# Patient Record
Sex: Female | Born: 1960 | Race: White | Hispanic: No | Marital: Single | Smoking: Never smoker
Health system: Southern US, Community
[De-identification: ages and names within clinical notes are randomized; demographics above are authoritative.]

## PROBLEM LIST (undated history)

## (undated) DIAGNOSIS — K760 Fatty (change of) liver, not elsewhere classified: Secondary | ICD-10-CM

## (undated) DIAGNOSIS — M329 Systemic lupus erythematosus, unspecified: Secondary | ICD-10-CM

## (undated) DIAGNOSIS — K219 Gastro-esophageal reflux disease without esophagitis: Secondary | ICD-10-CM

## (undated) DIAGNOSIS — I10 Essential (primary) hypertension: Secondary | ICD-10-CM

## (undated) DIAGNOSIS — D509 Iron deficiency anemia, unspecified: Secondary | ICD-10-CM

## (undated) DIAGNOSIS — K759 Inflammatory liver disease, unspecified: Secondary | ICD-10-CM

## (undated) DIAGNOSIS — M1990 Unspecified osteoarthritis, unspecified site: Secondary | ICD-10-CM

## (undated) DIAGNOSIS — M5020 Other cervical disc displacement, unspecified cervical region: Secondary | ICD-10-CM

## (undated) DIAGNOSIS — F329 Major depressive disorder, single episode, unspecified: Secondary | ICD-10-CM

## (undated) DIAGNOSIS — E7800 Pure hypercholesterolemia, unspecified: Secondary | ICD-10-CM

## (undated) DIAGNOSIS — A6000 Herpesviral infection of urogenital system, unspecified: Secondary | ICD-10-CM

## (undated) DIAGNOSIS — F32A Depression, unspecified: Secondary | ICD-10-CM

## (undated) DIAGNOSIS — M797 Fibromyalgia: Secondary | ICD-10-CM

## (undated) DIAGNOSIS — M722 Plantar fascial fibromatosis: Secondary | ICD-10-CM

## (undated) HISTORY — DX: Fibromyalgia: M79.7

## (undated) HISTORY — DX: Systemic lupus erythematosus, unspecified: M32.9

## (undated) HISTORY — DX: Iron deficiency anemia, unspecified: D50.9

## (undated) HISTORY — DX: Pure hypercholesterolemia, unspecified: E78.00

## (undated) HISTORY — DX: Other cervical disc displacement, unspecified cervical region: M50.20

## (undated) HISTORY — DX: Fatty (change of) liver, not elsewhere classified: K76.0

## (undated) HISTORY — DX: Depression, unspecified: F32.A

## (undated) HISTORY — DX: Inflammatory liver disease, unspecified: K75.9

## (undated) HISTORY — DX: Herpesviral infection of urogenital system, unspecified: A60.00

## (undated) HISTORY — DX: Major depressive disorder, single episode, unspecified: F32.9

## (undated) HISTORY — DX: Plantar fascial fibromatosis: M72.2

## (undated) HISTORY — PX: CHOLECYSTECTOMY: SHX55

## (undated) HISTORY — DX: Unspecified osteoarthritis, unspecified site: M19.90

## (undated) HISTORY — DX: Gastro-esophageal reflux disease without esophagitis: K21.9

---

## 1998-07-23 ENCOUNTER — Encounter: Admission: RE | Admit: 1998-07-23 | Discharge: 1998-08-08 | Payer: Self-pay | Admitting: Sports Medicine

## 2005-03-11 ENCOUNTER — Ambulatory Visit: Payer: Self-pay

## 2005-10-20 ENCOUNTER — Ambulatory Visit: Payer: Self-pay | Admitting: Pain Medicine

## 2005-10-26 ENCOUNTER — Ambulatory Visit: Payer: Self-pay | Admitting: Pain Medicine

## 2005-11-26 ENCOUNTER — Ambulatory Visit: Payer: Self-pay | Admitting: Pain Medicine

## 2006-02-17 ENCOUNTER — Ambulatory Visit: Payer: Self-pay | Admitting: Pain Medicine

## 2006-04-13 ENCOUNTER — Ambulatory Visit: Payer: Self-pay | Admitting: Pain Medicine

## 2007-06-23 HISTORY — PX: INCONTINENCE SURGERY: SHX676

## 2007-06-23 HISTORY — PX: TOTAL ABDOMINAL HYSTERECTOMY W/ BILATERAL SALPINGOOPHORECTOMY: SHX83

## 2009-03-11 ENCOUNTER — Encounter: Admission: RE | Admit: 2009-03-11 | Discharge: 2009-03-11 | Payer: Self-pay | Admitting: Obstetrics and Gynecology

## 2009-03-19 ENCOUNTER — Encounter (INDEPENDENT_AMBULATORY_CARE_PROVIDER_SITE_OTHER): Payer: Self-pay | Admitting: Obstetrics and Gynecology

## 2009-03-19 ENCOUNTER — Inpatient Hospital Stay (HOSPITAL_COMMUNITY): Admission: RE | Admit: 2009-03-19 | Discharge: 2009-03-20 | Payer: Self-pay | Admitting: Obstetrics and Gynecology

## 2010-07-14 ENCOUNTER — Encounter: Payer: Self-pay | Admitting: Obstetrics and Gynecology

## 2010-09-26 LAB — URINALYSIS, ROUTINE W REFLEX MICROSCOPIC
Bilirubin Urine: NEGATIVE
Glucose, UA: NEGATIVE mg/dL
Ketones, ur: NEGATIVE mg/dL
Leukocytes, UA: NEGATIVE
Nitrite: NEGATIVE
Protein, ur: NEGATIVE mg/dL
Specific Gravity, Urine: <1.005 — ABNORMAL LOW (ref 1.005–1.030)
Urobilinogen, UA: 0.2 mg/dL (ref 0.0–1.0)
pH: 7 (ref 5.0–8.0)

## 2010-09-26 LAB — BASIC METABOLIC PANEL
BUN: 3 mg/dL — ABNORMAL LOW (ref 6–23)
CO2: 29 mEq/L (ref 19–32)
Calcium: 8.1 mg/dL — ABNORMAL LOW (ref 8.4–10.5)
Chloride: 101 mEq/L (ref 96–112)
Creatinine, Ser: 0.8 mg/dL (ref 0.4–1.2)
GFR calc Af Amer: >60 mL/min (ref >60)
GFR calc non Af Amer: >60 mL/min (ref >60)
Glucose, Bld: 123 mg/dL — ABNORMAL HIGH (ref 70–99)
Potassium: 3.9 mEq/L (ref 3.5–5.1)
Sodium: 133 mEq/L — ABNORMAL LOW (ref 135–145)

## 2010-09-26 LAB — COMPREHENSIVE METABOLIC PANEL
ALT: 60 U/L — ABNORMAL HIGH (ref 0–35)
AST: 38 U/L — ABNORMAL HIGH (ref 0–37)
Albumin: 4.3 g/dL (ref 3.5–5.2)
Alkaline Phosphatase: 70 U/L (ref 39–117)
BUN: 4 mg/dL — ABNORMAL LOW (ref 6–23)
CO2: 31 mEq/L (ref 19–32)
Calcium: 9.3 mg/dL (ref 8.4–10.5)
Chloride: 99 mEq/L (ref 96–112)
Creatinine, Ser: 0.92 mg/dL (ref 0.4–1.2)
GFR calc Af Amer: >60 mL/min (ref >60)
GFR calc non Af Amer: >60 mL/min (ref >60)
Glucose, Bld: 90 mg/dL (ref 70–99)
Potassium: 2.9 mEq/L — ABNORMAL LOW (ref 3.5–5.1)
Sodium: 136 mEq/L (ref 135–145)
Total Bilirubin: 1 mg/dL (ref 0.3–1.2)
Total Protein: 8 g/dL (ref 6.0–8.3)

## 2010-09-26 LAB — CBC
HCT: 35.7 % — ABNORMAL LOW (ref 36.0–46.0)
HCT: 46.2 % — ABNORMAL HIGH (ref 36.0–46.0)
Hemoglobin: 11.9 g/dL — ABNORMAL LOW (ref 12.0–15.0)
Hemoglobin: 15.7 g/dL — ABNORMAL HIGH (ref 12.0–15.0)
MCHC: 33.2 g/dL (ref 30.0–36.0)
MCHC: 34.1 g/dL (ref 30.0–36.0)
MCV: 93.5 fL (ref 78.0–100.0)
MCV: 95.6 fL (ref 78.0–100.0)
Platelets: 227 10*3/uL (ref 150–400)
Platelets: 315 10*3/uL (ref 150–400)
RBC: 3.74 MIL/uL — ABNORMAL LOW (ref 3.87–5.11)
RBC: 4.94 MIL/uL (ref 3.87–5.11)
RDW: 13.1 % (ref 11.5–15.5)
RDW: 13.3 % (ref 11.5–15.5)
WBC: 10.1 10*3/uL (ref 4.0–10.5)
WBC: 5.6 10*3/uL (ref 4.0–10.5)

## 2010-09-26 LAB — URINE MICROSCOPIC-ADD ON

## 2010-09-26 LAB — PREGNANCY, URINE: Preg Test, Ur: NEGATIVE

## 2010-09-26 LAB — POTASSIUM: Potassium: 3.3 mEq/L — ABNORMAL LOW (ref 3.5–5.1)

## 2010-12-12 ENCOUNTER — Ambulatory Visit (INDEPENDENT_AMBULATORY_CARE_PROVIDER_SITE_OTHER): Payer: 59 | Admitting: Internal Medicine

## 2010-12-12 ENCOUNTER — Encounter: Payer: Self-pay | Admitting: Internal Medicine

## 2010-12-12 ENCOUNTER — Other Ambulatory Visit (INDEPENDENT_AMBULATORY_CARE_PROVIDER_SITE_OTHER): Payer: 59

## 2010-12-12 VITALS — BP 132/88 | HR 69 | Temp 98.1°F | Ht 62.0 in | Wt 189.0 lb

## 2010-12-12 DIAGNOSIS — R7401 Elevation of levels of liver transaminase levels: Secondary | ICD-10-CM

## 2010-12-12 DIAGNOSIS — M5020 Other cervical disc displacement, unspecified cervical region: Secondary | ICD-10-CM

## 2010-12-12 DIAGNOSIS — E559 Vitamin D deficiency, unspecified: Secondary | ICD-10-CM | POA: Insufficient documentation

## 2010-12-12 DIAGNOSIS — A6000 Herpesviral infection of urogenital system, unspecified: Secondary | ICD-10-CM

## 2010-12-12 DIAGNOSIS — R748 Abnormal levels of other serum enzymes: Secondary | ICD-10-CM | POA: Insufficient documentation

## 2010-12-12 DIAGNOSIS — R202 Paresthesia of skin: Secondary | ICD-10-CM

## 2010-12-12 DIAGNOSIS — M129 Arthropathy, unspecified: Secondary | ICD-10-CM

## 2010-12-12 DIAGNOSIS — K760 Fatty (change of) liver, not elsewhere classified: Secondary | ICD-10-CM

## 2010-12-12 DIAGNOSIS — R209 Unspecified disturbances of skin sensation: Secondary | ICD-10-CM

## 2010-12-12 DIAGNOSIS — R7402 Elevation of levels of lactic acid dehydrogenase (LDH): Secondary | ICD-10-CM

## 2010-12-12 DIAGNOSIS — M1990 Unspecified osteoarthritis, unspecified site: Secondary | ICD-10-CM

## 2010-12-12 DIAGNOSIS — K759 Inflammatory liver disease, unspecified: Secondary | ICD-10-CM

## 2010-12-12 DIAGNOSIS — F32A Depression, unspecified: Secondary | ICD-10-CM

## 2010-12-12 DIAGNOSIS — M722 Plantar fascial fibromatosis: Secondary | ICD-10-CM

## 2010-12-12 DIAGNOSIS — K219 Gastro-esophageal reflux disease without esophagitis: Secondary | ICD-10-CM

## 2010-12-12 DIAGNOSIS — F3289 Other specified depressive episodes: Secondary | ICD-10-CM

## 2010-12-12 DIAGNOSIS — K7689 Other specified diseases of liver: Secondary | ICD-10-CM

## 2010-12-12 DIAGNOSIS — F329 Major depressive disorder, single episode, unspecified: Secondary | ICD-10-CM

## 2010-12-12 LAB — VITAMIN B12: Vitamin B-12: 227 pg/mL (ref 211–911)

## 2010-12-12 LAB — SEDIMENTATION RATE: Sed Rate: 18 mm/hr (ref 0–22)

## 2010-12-12 MED ORDER — OXYBUTYNIN CHLORIDE ER 15 MG PO TB24: 15.0000 mg | ORAL_TABLET | Freq: Every day | ORAL | Status: DC

## 2010-12-12 MED ORDER — OMEPRAZOLE 40 MG PO CPDR: 40.0000 mg | DELAYED_RELEASE_CAPSULE | Freq: Every day | ORAL | Status: DC

## 2010-12-12 MED ORDER — ACYCLOVIR 400 MG PO TABS: 400.0000 mg | ORAL_TABLET | Freq: Every day | ORAL | Status: DC

## 2010-12-12 MED ORDER — ESCITALOPRAM OXALATE 10 MG PO TABS: 10.0000 mg | ORAL_TABLET | Freq: Every day | ORAL | Status: DC

## 2010-12-12 MED ORDER — BUPROPION HCL ER (XL) 300 MG PO TB24: 300.0000 mg | ORAL_TABLET | Freq: Every day | ORAL | Status: DC
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
## 2010-12-13 LAB — HEPATITIS B SURFACE ANTIGEN: Hepatitis B Surface Ag: NEGATIVE

## 2010-12-13 LAB — HEPATITIS B SURFACE ANTIBODY,QUALITATIVE: Hep B S Ab: NEGATIVE

## 2010-12-13 LAB — HEPATITIS B CORE ANTIBODY, TOTAL: Hep B Core Total Ab: NEGATIVE

## 2010-12-13 LAB — HEPATITIS C ANTIBODY: HCV Ab: NEGATIVE

## 2010-12-14 ENCOUNTER — Encounter: Payer: Self-pay | Admitting: Internal Medicine

## 2010-12-14 DIAGNOSIS — F32A Depression, unspecified: Secondary | ICD-10-CM | POA: Insufficient documentation

## 2010-12-14 DIAGNOSIS — F329 Major depressive disorder, single episode, unspecified: Secondary | ICD-10-CM | POA: Insufficient documentation

## 2010-12-14 DIAGNOSIS — K759 Inflammatory liver disease, unspecified: Secondary | ICD-10-CM | POA: Insufficient documentation

## 2010-12-14 DIAGNOSIS — K219 Gastro-esophageal reflux disease without esophagitis: Secondary | ICD-10-CM | POA: Insufficient documentation

## 2010-12-14 DIAGNOSIS — M1990 Unspecified osteoarthritis, unspecified site: Secondary | ICD-10-CM | POA: Insufficient documentation

## 2010-12-14 DIAGNOSIS — M5020 Other cervical disc displacement, unspecified cervical region: Secondary | ICD-10-CM | POA: Insufficient documentation

## 2010-12-14 DIAGNOSIS — K760 Fatty (change of) liver, not elsewhere classified: Secondary | ICD-10-CM | POA: Insufficient documentation

## 2010-12-14 DIAGNOSIS — A6000 Herpesviral infection of urogenital system, unspecified: Secondary | ICD-10-CM | POA: Insufficient documentation

## 2010-12-14 DIAGNOSIS — M722 Plantar fascial fibromatosis: Secondary | ICD-10-CM | POA: Insufficient documentation

## 2010-12-14 LAB — VITAMIN D 1,25 DIHYDROXY
Vitamin D 1, 25 (OH)2 Total: 27 pg/mL (ref 18–72)
Vitamin D2 1, 25 (OH)2: 27 pg/mL
Vitamin D3 1, 25 (OH)2: <8 pg/mL

## 2010-12-14 NOTE — Assessment & Plan Note (Signed)
Patient has been on high dose vit D replace for many months.  Plan - recheck vit D with recommendations to follow.   Addendum - Vit D 27 - remains insufficient  Plan will need evaluation for parathyroid disease - iPTH, ionized serum calcium

## 2010-12-14 NOTE — Assessment & Plan Note (Signed)
Ddoing well with few outbreaks.  Plan continue acyclovir.

## 2010-12-14 NOTE — Assessment & Plan Note (Signed)
Stable at this time on PPI therapy. May come to GI evaluation in the future

## 2010-12-14 NOTE — Assessment & Plan Note (Signed)
Patient reports that she has not been screened for chronic hepatitis.  Plan - for chronic hepatitis will check Hep B core Ab, surface Ab, antigen; Hep C Ab and HIV           Will order Abdominal U/S to evaluate liver architecture  Addendum - hepatitis labs normal.

## 2010-12-14 NOTE — Assessment & Plan Note (Signed)
Patient with mild paresthesia that is not limiting  Plan - will defer further work-up at this time since symptoms are minor

## 2010-12-14 NOTE — Assessment & Plan Note (Signed)
Probable cause of elevated transaminase levels in light of negative hepatitis serologies  Plan - Abdominal U/S

## 2010-12-14 NOTE — Assessment & Plan Note (Signed)
Plan as per paresthesia above. For worsening symptoms will consider imaging studies.

## 2010-12-14 NOTE — Assessment & Plan Note (Signed)
Patient reports that her symptoms are well controlled at this time  Plan - continue counseling           Continue medications

## 2010-12-19 ENCOUNTER — Ambulatory Visit (HOSPITAL_COMMUNITY)
Admission: RE | Admit: 2010-12-19 | Discharge: 2010-12-19 | Disposition: A | Discharge status: Home / Self Care | Payer: 59 | Source: Ambulatory Visit | Attending: Internal Medicine | Admitting: Internal Medicine

## 2010-12-19 DIAGNOSIS — K7689 Other specified diseases of liver: Secondary | ICD-10-CM | POA: Insufficient documentation

## 2010-12-19 DIAGNOSIS — Z9089 Acquired absence of other organs: Secondary | ICD-10-CM | POA: Insufficient documentation

## 2010-12-19 DIAGNOSIS — R7401 Elevation of levels of liver transaminase levels: Secondary | ICD-10-CM | POA: Insufficient documentation

## 2010-12-19 DIAGNOSIS — R7402 Elevation of levels of lactic acid dehydrogenase (LDH): Secondary | ICD-10-CM | POA: Insufficient documentation

## 2010-12-19 DIAGNOSIS — R748 Abnormal levels of other serum enzymes: Secondary | ICD-10-CM

## 2010-12-21 ENCOUNTER — Telehealth: Payer: Self-pay | Admitting: Internal Medicine

## 2010-12-21 NOTE — Telephone Encounter (Signed)
Please call the patient. Abdominal u/s - reveals fatty infiltrate of the liver which may be the cause of minor elevation in liver functions. For questions - OV

## 2010-12-22 NOTE — Telephone Encounter (Signed)
Informed pt of results from abd [place].

## 2010-12-26 ENCOUNTER — Other Ambulatory Visit: Payer: 59

## 2010-12-26 ENCOUNTER — Ambulatory Visit (INDEPENDENT_AMBULATORY_CARE_PROVIDER_SITE_OTHER): Payer: 59 | Admitting: Internal Medicine

## 2010-12-26 DIAGNOSIS — E559 Vitamin D deficiency, unspecified: Secondary | ICD-10-CM

## 2010-12-26 DIAGNOSIS — R32 Unspecified urinary incontinence: Secondary | ICD-10-CM

## 2010-12-26 DIAGNOSIS — K7689 Other specified diseases of liver: Secondary | ICD-10-CM

## 2010-12-26 DIAGNOSIS — F329 Major depressive disorder, single episode, unspecified: Secondary | ICD-10-CM

## 2010-12-26 DIAGNOSIS — F32A Depression, unspecified: Secondary | ICD-10-CM

## 2010-12-26 DIAGNOSIS — K760 Fatty (change of) liver, not elsewhere classified: Secondary | ICD-10-CM

## 2010-12-26 DIAGNOSIS — F3289 Other specified depressive episodes: Secondary | ICD-10-CM

## 2010-12-26 NOTE — Progress Notes (Signed)
  Subjective:    Patient ID: Terri Hurley, female    DOB: [birth year], 51 y.o.   MRN: [id number]  HPI Terri Hurley last seen June 22nd as a new patient. Note reviewed. She had no additions or corrections. In the interval she has had abdominal u/s which was negative except for hepatic steatosis. Her serologies were negative for any infectious hepatitis. She has continued to have some mild paresthesia both hands and hyperesthesias of the forearms. Vitatmin D level remains low at 27 despite high dose Vitamin D replacement.   PMH, FamHx and SocHx reviewed for any changes and relevance.    Review of Systems Review of Systems  Constitutional:  Negative for fever, chills, activity change and unexpected weight change.  HEENT:  Negative for hearing loss, ear pain, congestion, neck stiffness and postnasal drip. Negative for sore throat or swallowing problems. Negative for dental complaints.   Eyes: Negative for vision loss or change in visual acuity.  Respiratory: Negative for chest tightness and wheezing.   Cardiovascular: Negative for chest pain and palpitation. No decreased exercise tolerance Gastrointestinal: No change in bowel habit. No bloating or gas. No reflux or indigestion Genitourinary: Negative for urgency, frequency, flank pain and difficulty urinating.  Musculoskeletal: Negative for myalgias, back pain, arthralgias and gait problem.  Neurological: Negative for dizziness, tremors, weakness and headaches.  Hematological: Negative for adenopathy.  Psychiatric/Behavioral: Negative for behavioral problems and dysphoric mood.       Objective:   Physical Exam Gen'l-WNWD overweight white woman HEENT - C&S clear Pul - normal respirations Cor RRR       Assessment & Plan:

## 2010-12-28 NOTE — Assessment & Plan Note (Signed)
Continued concern for weight gain - labs are not providing an explanation She has notified her psychiatrist about concerns of weight gain side effects of medication but she hasn't returned for follow-up visit.  Plan - defer to psychiatry as to possible medication adjusts to help reduce iatrogenic weight gain.

## 2010-12-28 NOTE — Assessment & Plan Note (Signed)
Vitamin D remains low at 27. Discussed with her the need for further endocrine evaluation for underlying cause.  Plan - iPTH and calcium to r/o parathyroid disease           Return to endocrinologist for further evaluation; either Dr.Moriatry in [facility] or Dr. Everardo All.

## 2010-12-28 NOTE — Assessment & Plan Note (Signed)
Life style limiting urinary incontinence  Plan - refer to Dr. McDiarmid for consultation.

## 2010-12-28 NOTE — Assessment & Plan Note (Signed)
By u/s and by absence of underlying chronic infectious diarrhea the working diagnosis is NAFLD, hepatic steastosis  Plan - information provided to patient.            She will work on a low fat diet.

## 2010-12-29 LAB — PTH, INTACT AND CALCIUM
Calcium, Total (PTH): 9.8 mg/dL (ref 8.4–10.5)
PTH: 48 pg/mL (ref 14.0–72.0)

## 2011-01-05 ENCOUNTER — Encounter: Payer: Self-pay | Admitting: Internal Medicine

## 2011-06-05 ENCOUNTER — Ambulatory Visit (INDEPENDENT_AMBULATORY_CARE_PROVIDER_SITE_OTHER): Payer: 59

## 2011-06-05 DIAGNOSIS — E559 Vitamin D deficiency, unspecified: Secondary | ICD-10-CM

## 2011-06-05 DIAGNOSIS — R300 Dysuria: Secondary | ICD-10-CM

## 2011-10-21 LAB — HM COLONOSCOPY

## 2011-11-02 ENCOUNTER — Ambulatory Visit (INDEPENDENT_AMBULATORY_CARE_PROVIDER_SITE_OTHER): Payer: BC Managed Care – PPO | Admitting: Family Medicine

## 2011-11-02 VITALS — BP 147/84 | HR 76 | Temp 98.4°F | Resp 16 | Ht 62.0 in | Wt 192.0 lb

## 2011-11-02 DIAGNOSIS — M5432 Sciatica, left side: Secondary | ICD-10-CM

## 2011-11-02 DIAGNOSIS — M5430 Sciatica, unspecified side: Secondary | ICD-10-CM

## 2011-11-02 MED ORDER — OXYCODONE-ACETAMINOPHEN 7.5-325 MG PO TABS: 1.0000 | ORAL_TABLET | ORAL | Status: AC | PRN

## 2011-11-02 MED ORDER — METHYLPREDNISOLONE ACETATE 80 MG/ML IJ SUSP
80.0000 mg | Freq: Once | INTRAMUSCULAR | Status: AC
  Administered 2011-11-02: 80 mg via INTRAMUSCULAR

## 2011-11-02 NOTE — Progress Notes (Signed)
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 She has been taking Vicodin, tramadol, and ibuprofen. Pain is located in both buttocks, radiating down left leg to foot.  No numbness or weakness.  Patient has chronic urinary urgency which has not changed. No falls or accidents.  Patient has chronic low back pain. Patient has h/o fatty liver  O:  Obese woman. Patient pacing in room in mild distress Unable to bend forward Able to stand on tip toes, lift both legs and stand on one leg. Absent AJ and KJ bilaterally SLR positive both sides.  Patient has pain when sitting down.  Cannot reach to put on socks.  A:  Strongly suspect ruptured disc.  P:  Lumbar MRI  ---> insurance refuses to cover. Depomedrol 80 IM Percocet 7.5 tid Refer to ortho.

## 2011-11-02 NOTE — Patient Instructions (Signed)

## 2011-11-05 ENCOUNTER — Telehealth: Payer: Self-pay

## 2011-11-05 ENCOUNTER — Ambulatory Visit
Admission: RE | Admit: 2011-11-05 | Discharge: 2011-11-05 | Disposition: A | Discharge status: Home / Self Care | Payer: Self-pay | Source: Ambulatory Visit | Attending: Family Medicine | Admitting: Family Medicine

## 2011-11-05 DIAGNOSIS — M5432 Sciatica, left side: Secondary | ICD-10-CM

## 2011-11-05 NOTE — Telephone Encounter (Signed)
Pt states she had an mri done this morning that dr L ordered. She knows he is not on the schedule for a few days and wanted to know if someone else could give her results when they are received.   Best: [phone]  bf

## 2011-11-06 NOTE — Telephone Encounter (Signed)
Mild lumbar spondylosis as described above. No discrete left-sided lesions are present to account for left greater than right lower extremity radiculopathy. Ordering MD to follow up. Please forward to Dr. Elbert Ewings.

## 2011-11-07 NOTE — Telephone Encounter (Signed)
Spoke with patient and she was already notified MRI results yesterday.  She states she is in extreme pain and the Percocet is not helping much.  Unable to see ortho until early June.  She would like refill of Percocet... Can we rx or does she need OV?

## 2011-11-07 NOTE — Telephone Encounter (Signed)
I am very worried about the degree of her pain with the normal MRI.  We don't have the means to do further evaluation here.  She needs to go to the emergency room.

## 2011-11-08 NOTE — Telephone Encounter (Signed)
Patient called back and notified.  Will go to ER for further treatment.

## 2011-11-08 NOTE — Telephone Encounter (Signed)
LMOM to CB. 

## 2011-11-09 ENCOUNTER — Telehealth: Payer: Self-pay

## 2011-11-09 NOTE — Telephone Encounter (Signed)
Pt would like to know if we could prescribed her in an anti-inflammatory. [facility]: [facility]

## 2011-11-10 ENCOUNTER — Other Ambulatory Visit: Payer: Self-pay | Admitting: Family Medicine

## 2011-11-10 MED ORDER — DICLOFENAC SODIUM 75 MG PO TBEC: 75.0000 mg | DELAYED_RELEASE_TABLET | Freq: Two times a day (BID) | ORAL | Status: DC

## 2011-11-10 NOTE — Telephone Encounter (Signed)
Dr. Milus Glazier needs to decide.

## 2011-11-18 ENCOUNTER — Encounter: Payer: Self-pay | Admitting: Internal Medicine

## 2011-11-18 ENCOUNTER — Ambulatory Visit (INDEPENDENT_AMBULATORY_CARE_PROVIDER_SITE_OTHER): Payer: BC Managed Care – PPO | Admitting: Internal Medicine

## 2011-11-18 ENCOUNTER — Other Ambulatory Visit (INDEPENDENT_AMBULATORY_CARE_PROVIDER_SITE_OTHER): Payer: BC Managed Care – PPO

## 2011-11-18 VITALS — BP 140/80 | HR 70 | Temp 98.2°F | Resp 16 | Wt 192.0 lb

## 2011-11-18 DIAGNOSIS — M1990 Unspecified osteoarthritis, unspecified site: Secondary | ICD-10-CM

## 2011-11-18 DIAGNOSIS — E559 Vitamin D deficiency, unspecified: Secondary | ICD-10-CM

## 2011-11-18 DIAGNOSIS — Z0000 Encounter for general adult medical examination without abnormal findings: Secondary | ICD-10-CM

## 2011-11-18 DIAGNOSIS — K219 Gastro-esophageal reflux disease without esophagitis: Secondary | ICD-10-CM

## 2011-11-18 DIAGNOSIS — M545 Low back pain, unspecified: Secondary | ICD-10-CM

## 2011-11-18 DIAGNOSIS — K7689 Other specified diseases of liver: Secondary | ICD-10-CM

## 2011-11-18 DIAGNOSIS — R202 Paresthesia of skin: Secondary | ICD-10-CM

## 2011-11-18 DIAGNOSIS — E162 Hypoglycemia, unspecified: Secondary | ICD-10-CM

## 2011-11-18 DIAGNOSIS — K760 Fatty (change of) liver, not elsewhere classified: Secondary | ICD-10-CM

## 2011-11-18 DIAGNOSIS — R209 Unspecified disturbances of skin sensation: Secondary | ICD-10-CM

## 2011-11-18 DIAGNOSIS — Z23 Encounter for immunization: Secondary | ICD-10-CM

## 2011-11-18 DIAGNOSIS — E669 Obesity, unspecified: Secondary | ICD-10-CM

## 2011-11-18 LAB — COMPREHENSIVE METABOLIC PANEL
ALT: 51 U/L — ABNORMAL HIGH (ref 0–35)
AST: 35 U/L (ref 0–37)
Albumin: 4 g/dL (ref 3.5–5.2)
Alkaline Phosphatase: 70 U/L (ref 39–117)
BUN: 8 mg/dL (ref 6–23)
CO2: 28 mEq/L (ref 19–32)
Calcium: 9 mg/dL (ref 8.4–10.5)
Chloride: 107 mEq/L (ref 96–112)
Creatinine, Ser: 0.9 mg/dL (ref 0.4–1.2)
GFR: 69.07 mL/min (ref >60.00)
Glucose, Bld: 95 mg/dL (ref 70–99)
Potassium: 4.1 mEq/L (ref 3.5–5.1)
Sodium: 141 mEq/L (ref 135–145)
Total Bilirubin: 0.6 mg/dL (ref 0.3–1.2)
Total Protein: 7.3 g/dL (ref 6.0–8.3)

## 2011-11-18 LAB — CBC WITH DIFFERENTIAL/PLATELET
Basophils Absolute: 0 10*3/uL (ref 0.0–0.1)
Basophils Relative: 0.7 % (ref 0.0–3.0)
Eosinophils Absolute: 0 10*3/uL (ref 0.0–0.7)
Eosinophils Relative: 0.8 % (ref 0.0–5.0)
HCT: 41.2 % (ref 36.0–46.0)
Hemoglobin: 13.6 g/dL (ref 12.0–15.0)
Lymphocytes Relative: 23 % (ref 12.0–46.0)
Lymphs Abs: 1.2 10*3/uL (ref 0.7–4.0)
MCHC: 33 g/dL (ref 30.0–36.0)
MCV: 93.1 fl (ref 78.0–100.0)
Monocytes Absolute: 0.4 10*3/uL (ref 0.1–1.0)
Monocytes Relative: 7.2 % (ref 3.0–12.0)
Neutro Abs: 3.7 10*3/uL (ref 1.4–7.7)
Neutrophils Relative %: 68.3 % (ref 43.0–77.0)
Platelets: 264 10*3/uL (ref 150.0–400.0)
RBC: 4.43 Mil/uL (ref 3.87–5.11)
RDW: 13.8 % (ref 11.5–14.6)
WBC: 5.4 10*3/uL (ref 4.5–10.5)

## 2011-11-18 LAB — VITAMIN B12: Vitamin B-12: 243 pg/mL (ref 211–911)

## 2011-11-18 LAB — LIPID PANEL
Cholesterol: 207 mg/dL — ABNORMAL HIGH (ref 0–200)
HDL: 64.4 mg/dL (ref >39.00)
Total CHOL/HDL Ratio: 3
Triglycerides: 62 mg/dL (ref 0.0–149.0)
VLDL: 12.4 mg/dL (ref 0.0–40.0)

## 2011-11-18 LAB — TSH: TSH: 1.01 u[IU]/mL (ref 0.35–5.50)

## 2011-11-18 LAB — MAGNESIUM: Magnesium: 2.2 mg/dL (ref 1.5–2.5)

## 2011-11-18 LAB — HEMOGLOBIN A1C: Hgb A1c MFr Bld: 5.5 % (ref 4.6–6.5)

## 2011-11-18 LAB — SEDIMENTATION RATE: Sed Rate: 17 mm/hr (ref 0–22)

## 2011-11-18 LAB — LDL CHOLESTEROL, DIRECT: Direct LDL: 140.5 mg/dL

## 2011-11-18 MED ORDER — BUPROPION HCL ER (XL) 300 MG PO TB24: 300.0000 mg | ORAL_TABLET | Freq: Every day | ORAL | Status: DC

## 2011-11-18 MED ORDER — ESCITALOPRAM OXALATE 10 MG PO TABS: 10.0000 mg | ORAL_TABLET | Freq: Every day | ORAL | Status: DC

## 2011-11-18 MED ORDER — OXYBUTYNIN CHLORIDE ER 15 MG PO TB24: 15.0000 mg | ORAL_TABLET | Freq: Every day | ORAL | Status: DC

## 2011-11-18 MED ORDER — ACYCLOVIR 400 MG PO TABS: 400.0000 mg | ORAL_TABLET | Freq: Every day | ORAL | Status: DC

## 2011-11-18 MED ORDER — OMEPRAZOLE 40 MG PO CPDR: 40.0000 mg | DELAYED_RELEASE_CAPSULE | Freq: Every day | ORAL | Status: DC

## 2011-11-18 NOTE — Assessment & Plan Note (Signed)
Patient with flare of low back pain. Reviewed UC note and MRI. Back exam today is w/o radicular findings. Suspect low back pain but not HNP.  Plan - Continue NSAIDs. Avoid use of narcotics  Back exercise: pamphlet provided and she is directed to [email or web address] for exercise videos  Do not see need for ortho consult  For continued discomfort will refer to PT

## 2011-11-18 NOTE — Assessment & Plan Note (Signed)
Discussed the importance of weight management: smart food choices, PORTION SIZE CONTROL, regular exercise - 30 minutes 3 times a week of exercise that gets heart rate to 110-120. Target weight 160 lbs, goal is to loose 1 lb per month (32 month project)

## 2011-11-18 NOTE — Assessment & Plan Note (Addendum)
Routine follow up lab with recommendations to follow.  Addendum - LFTs normal.  Plan - watchful waiting

## 2011-11-18 NOTE — Assessment & Plan Note (Signed)
Last Vit D level was 27.   Plan- Vit D level with recommendations to follow.
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
## 2011-11-19 DIAGNOSIS — M545 Low back pain, unspecified: Secondary | ICD-10-CM | POA: Insufficient documentation

## 2011-11-19 DIAGNOSIS — Z0000 Encounter for general adult medical examination without abnormal findings: Secondary | ICD-10-CM | POA: Insufficient documentation

## 2011-11-19 DIAGNOSIS — M79605 Pain in left leg: Secondary | ICD-10-CM | POA: Insufficient documentation

## 2011-11-19 NOTE — Assessment & Plan Note (Signed)
Physical exam w/o evidence of radiculopathy. MRI reviewed - no nerve compression seen. Suspect MSK related low back pain.  Plan Stretch exercises: pamphlet provided; directed to [email or web address]  Continue NSAID - diclofenac  Minimize use of narcotics (provided at UC - no Rx provided)  Weight management

## 2011-11-19 NOTE — Assessment & Plan Note (Signed)
Interval medical history notable for low back pain, transient low blood sugars. Physical exam remarkable for weight issues, normal back exam. Lab results - normal liver functions, normal blood sugar and normal A1C. She had a mammogram in Sept '10 and is due for follow-up. By her report she has had colonoscopy - will pull chart. Immunization is brought up to date.   In summary - a nice woman who has low back pain that can be treated conservatively with exercise/stretch, NSAIDs; who has had transient low blood sugars with normal A1C suggesting post-prandial hypoglycemia that is mild - she should avoid heavy carb diet and sweets focusing on high protein diet; who is current with health maintenance. She will return for follow-up in 6 months.

## 2011-11-22 LAB — VITAMIN D 1,25 DIHYDROXY
Vitamin D 1, 25 (OH)2 Total: 52 pg/mL (ref 18–72)
Vitamin D2 1, 25 (OH)2: 26 pg/mL
Vitamin D3 1, 25 (OH)2: 26 pg/mL

## 2011-11-23 ENCOUNTER — Telehealth: Payer: Self-pay | Admitting: *Deleted

## 2011-11-23 NOTE — Telephone Encounter (Signed)
Message copied by Elnora Morrison on Mon Nov 23, 2011  9:50 AM ------      Message from: Jacques Navy      Created: Sun Nov 22, 2011  2:55 PM       Call patient - Vitamin d level is definitely normal range. Continue daily supplement 1,000 iu Vit D

## 2011-11-23 NOTE — Telephone Encounter (Signed)
Patient notified of lab result for vitamin d level

## 2012-05-25 ENCOUNTER — Encounter: Payer: Self-pay | Admitting: Internal Medicine

## 2012-05-25 ENCOUNTER — Ambulatory Visit (INDEPENDENT_AMBULATORY_CARE_PROVIDER_SITE_OTHER): Payer: BC Managed Care – PPO | Admitting: Internal Medicine

## 2012-05-25 ENCOUNTER — Other Ambulatory Visit (INDEPENDENT_AMBULATORY_CARE_PROVIDER_SITE_OTHER): Payer: BC Managed Care – PPO

## 2012-05-25 VITALS — BP 132/70 | HR 67 | Temp 98.5°F | Resp 16 | Ht 62.0 in | Wt 188.0 lb

## 2012-05-25 DIAGNOSIS — R5381 Other malaise: Secondary | ICD-10-CM

## 2012-05-25 DIAGNOSIS — IMO0001 Reserved for inherently not codable concepts without codable children: Secondary | ICD-10-CM

## 2012-05-25 DIAGNOSIS — M791 Myalgia, unspecified site: Secondary | ICD-10-CM

## 2012-05-25 DIAGNOSIS — R5383 Other fatigue: Secondary | ICD-10-CM
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
## 2012-05-25 NOTE — Patient Instructions (Addendum)
Generalize muscle aches and overwhelming fatigue - exam is normal. Labs in May '13 with normal kidney function, potassium and sodium, blood sugar, liver functions and thyroid function. Infectious hepatitis has also been ruled out. Physical exam does not reveal any specific problem.  Plan Lab work to look for inflammatory muscle disease, B12 deficiency, connective tissue disease: Sed Rate, ANA, B1 CK total.  For comfort may try tylenol 1000 mg three times a day. May take ibuprofen or aleve. Avoid narcotics

## 2012-05-26 LAB — CK: Total CK: 159 U/L (ref 7–177)

## 2012-05-26 LAB — SEDIMENTATION RATE: Sed Rate: 13 mm/hr (ref 0–22)

## 2012-05-26 LAB — ANA: Anti Nuclear Antibody(ANA): POSITIVE — AB

## 2012-05-26 LAB — VITAMIN B12: Vitamin B-12: 236 pg/mL (ref 211–911)

## 2012-05-26 LAB — ANTI-NUCLEAR AB-TITER (ANA TITER): ANA Titer 1: 1:40 {titer} — ABNORMAL HIGH

## 2012-05-26 LAB — RHEUMATOID FACTOR: Rhuematoid fact SerPl-aCnc: <10 IU/mL (ref <=14)

## 2012-08-06 ENCOUNTER — Other Ambulatory Visit: Payer: Self-pay

## 2012-11-21 ENCOUNTER — Other Ambulatory Visit (INDEPENDENT_AMBULATORY_CARE_PROVIDER_SITE_OTHER): Payer: BC Managed Care – PPO

## 2012-11-21 ENCOUNTER — Ambulatory Visit (INDEPENDENT_AMBULATORY_CARE_PROVIDER_SITE_OTHER): Payer: BC Managed Care – PPO | Admitting: Internal Medicine

## 2012-11-21 ENCOUNTER — Encounter: Payer: Self-pay | Admitting: Internal Medicine

## 2012-11-21 VITALS — BP 150/90 | HR 64 | Temp 98.4°F | Wt 192.4 lb

## 2012-11-21 DIAGNOSIS — F329 Major depressive disorder, single episode, unspecified: Secondary | ICD-10-CM

## 2012-11-21 DIAGNOSIS — R300 Dysuria: Secondary | ICD-10-CM

## 2012-11-21 DIAGNOSIS — K219 Gastro-esophageal reflux disease without esophagitis: Secondary | ICD-10-CM

## 2012-11-21 DIAGNOSIS — Z0000 Encounter for general adult medical examination without abnormal findings: Secondary | ICD-10-CM

## 2012-11-21 DIAGNOSIS — M1990 Unspecified osteoarthritis, unspecified site: Secondary | ICD-10-CM

## 2012-11-21 DIAGNOSIS — K760 Fatty (change of) liver, not elsewhere classified: Secondary | ICD-10-CM

## 2012-11-21 DIAGNOSIS — R748 Abnormal levels of other serum enzymes: Secondary | ICD-10-CM

## 2012-11-21 DIAGNOSIS — F32A Depression, unspecified: Secondary | ICD-10-CM

## 2012-11-21 DIAGNOSIS — R32 Unspecified urinary incontinence: Secondary | ICD-10-CM

## 2012-11-21 LAB — URINALYSIS, ROUTINE W REFLEX MICROSCOPIC
Bilirubin Urine: NEGATIVE
Ketones, ur: NEGATIVE
Leukocytes, UA: NEGATIVE
Nitrite: NEGATIVE
Specific Gravity, Urine: 1.025 (ref 1.000–1.030)
Total Protein, Urine: NEGATIVE
Urine Glucose: NEGATIVE
Urobilinogen, UA: 0.2 (ref 0.0–1.0)
pH: 6 (ref 5.0–8.0)

## 2012-11-21 LAB — COMPREHENSIVE METABOLIC PANEL
ALT: 38 U/L — ABNORMAL HIGH (ref 0–35)
AST: 27 U/L (ref 0–37)
Albumin: 3.7 g/dL (ref 3.5–5.2)
Alkaline Phosphatase: 66 U/L (ref 39–117)
BUN: 9 mg/dL (ref 6–23)
CO2: 27 mEq/L (ref 19–32)
Calcium: 9 mg/dL (ref 8.4–10.5)
Chloride: 107 mEq/L (ref 96–112)
Creatinine, Ser: 1 mg/dL (ref 0.4–1.2)
GFR: 58.98 mL/min — ABNORMAL LOW (ref >60.00)
Glucose, Bld: 92 mg/dL (ref 70–99)
Potassium: 3.9 mEq/L (ref 3.5–5.1)
Sodium: 141 mEq/L (ref 135–145)
Total Bilirubin: 0.5 mg/dL (ref 0.3–1.2)
Total Protein: 7.1 g/dL (ref 6.0–8.3)

## 2012-11-21 LAB — LIPID PANEL
Cholesterol: 201 mg/dL — ABNORMAL HIGH (ref 0–200)
HDL: 51.9 mg/dL (ref >39.00)
Total CHOL/HDL Ratio: 4
Triglycerides: 112 mg/dL (ref 0.0–149.0)
VLDL: 22.4 mg/dL (ref 0.0–40.0)

## 2012-11-21 LAB — HEPATIC FUNCTION PANEL
ALT: 38 U/L — ABNORMAL HIGH (ref 0–35)
AST: 27 U/L (ref 0–37)
Albumin: 3.7 g/dL (ref 3.5–5.2)
Alkaline Phosphatase: 66 U/L (ref 39–117)
Bilirubin, Direct: 0 mg/dL (ref 0.0–0.3)
Total Bilirubin: 0.5 mg/dL (ref 0.3–1.2)
Total Protein: 7.1 g/dL (ref 6.0–8.3)

## 2012-11-21 LAB — LDL CHOLESTEROL, DIRECT: Direct LDL: 130.7 mg/dL

## 2012-11-21 MED ORDER — BUPROPION HCL ER (XL) 300 MG PO TB24: 300.0000 mg | ORAL_TABLET | Freq: Every day | ORAL | Status: DC

## 2012-11-21 MED ORDER — FLUTICASONE PROPIONATE 0.05 % EX CREA: TOPICAL_CREAM | Freq: Two times a day (BID) | CUTANEOUS | Status: DC

## 2012-11-21 MED ORDER — ESCITALOPRAM OXALATE 10 MG PO TABS: 10.0000 mg | ORAL_TABLET | Freq: Every day | ORAL | Status: DC

## 2012-11-21 MED ORDER — RANITIDINE HCL 150 MG PO TABS: 150.0000 mg | ORAL_TABLET | Freq: Two times a day (BID) | ORAL | Status: DC

## 2012-11-21 MED ORDER — ACYCLOVIR 400 MG PO TABS: 400.0000 mg | ORAL_TABLET | Freq: Every day | ORAL | Status: DC

## 2012-11-21 MED ORDER — OXYBUTYNIN CHLORIDE ER 15 MG PO TB24: 15.0000 mg | ORAL_TABLET | Freq: Every day | ORAL | Status: DC

## 2012-11-21 MED ORDER — OMEPRAZOLE 40 MG PO CPDR: 40.0000 mg | DELAYED_RELEASE_CAPSULE | Freq: Every day | ORAL | Status: DC

## 2012-11-21 NOTE — Assessment & Plan Note (Signed)
Chronic stable problem. No additional testing or new treatments today.

## 2012-11-21 NOTE — Patient Instructions (Addendum)
Thanks for coming in to see me  1. Bladder issues - will refer to urology for evaluation  2. GERD - will attempt a change to a safer product: Plan Start ranitidine 150 mg AM and before bed  Continue omeprazole 40 mg in the AM for 3 days and then stop, taking the ranitidine only.  Call back if there is not good control  3. Blood pressure - please check at home and report back if it is consistently greater than 140/90  4. Mammography - [facility] on [address] [phone]  Health maintenance is otherwise up to date.

## 2012-11-21 NOTE — Assessment & Plan Note (Signed)
Multiple chronic problems including severe pain relieved with micturition. Concern for possible IC  Plan Refer to urology for evaluation

## 2012-11-21 NOTE — Assessment & Plan Note (Signed)
Controlled. Discussed changing over to H2 blocker for control  Plan  start ranitidine 150 bid with a 3 day over lap with omeprazole.  Call if not controlled

## 2012-11-21 NOTE — Assessment & Plan Note (Signed)
Interval history remarkable for bladder pain and voiding issues. Physical exam normal. Routine labs ordered including lipid panel. She is current with colorectal cancer screening. She is adamantly encouraged to have mammography. Immunizations are up to date.  In summar - a nice woman who is medically stable but need urology evaluation.

## 2012-11-21 NOTE — Assessment & Plan Note (Signed)
For follow up labs.

## 2012-11-21 NOTE — Assessment & Plan Note (Signed)
Stable at this time with a pleasant affect.
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
## 2012-11-21 NOTE — Assessment & Plan Note (Signed)
Stable w/o complaints at todays visit. Follow up liver functions

## 2013-04-27 ENCOUNTER — Other Ambulatory Visit: Payer: Self-pay

## 2013-06-10 ENCOUNTER — Ambulatory Visit (INDEPENDENT_AMBULATORY_CARE_PROVIDER_SITE_OTHER): Payer: BC Managed Care – PPO | Admitting: Emergency Medicine

## 2013-06-10 VITALS — BP 126/104 | HR 79 | Temp 98.7°F | Resp 18 | Ht 61.5 in | Wt 187.0 lb

## 2013-06-10 DIAGNOSIS — R300 Dysuria: Secondary | ICD-10-CM

## 2013-06-10 LAB — POCT URINALYSIS DIPSTICK
Bilirubin, UA: NEGATIVE
Glucose, UA: NEGATIVE
Ketones, UA: NEGATIVE
Nitrite, UA: NEGATIVE
Protein, UA: 30
Spec Grav, UA: 1.015
Urobilinogen, UA: 0.2
pH, UA: 7

## 2013-06-10 LAB — POCT UA - MICROSCOPIC ONLY
Casts, Ur, LPF, POC: NEGATIVE
Crystals, Ur, HPF, POC: NEGATIVE
Epithelial cells, urine per micros: NEGATIVE
Mucus, UA: NEGATIVE
Yeast, UA: NEGATIVE

## 2013-06-10 MED ORDER — CIPROFLOXACIN HCL 500 MG PO TABS: 500.0000 mg | ORAL_TABLET | Freq: Two times a day (BID) | ORAL | Status: DC

## 2013-06-10 NOTE — Patient Instructions (Signed)
Urinary Tract Infection  Urinary tract infections (UTIs) can develop anywhere along your urinary tract. Your urinary tract is your body's drainage system for removing wastes and extra water. Your urinary tract includes two kidneys, two ureters, a bladder, and a urethra. Your kidneys are a pair of bean-shaped organs. Each kidney is about the size of your fist. They are located below your ribs, one on each side of your spine.  CAUSES  Infections are caused by microbes, which are microscopic organisms, including fungi, viruses, and bacteria. These organisms are so small that they can only be seen through a microscope. Bacteria are the microbes that most commonly cause UTIs.  SYMPTOMS   Symptoms of UTIs may vary by age and gender of the patient and by the location of the infection. Symptoms in young women typically include a frequent and intense urge to urinate and a painful, burning feeling in the bladder or urethra during urination. Older women and men are more likely to be tired, shaky, and weak and have muscle aches and abdominal pain. A fever may mean the infection is in your kidneys. Other symptoms of a kidney infection include pain in your back or sides below the ribs, nausea, and vomiting.  DIAGNOSIS  To diagnose a UTI, your caregiver will ask you about your symptoms. Your caregiver also will ask to provide a urine sample. The urine sample will be tested for bacteria and white blood cells. White blood cells are made by your body to help fight infection.  TREATMENT   Typically, UTIs can be treated with medication. Because most UTIs are caused by a bacterial infection, they usually can be treated with the use of antibiotics. The choice of antibiotic and length of treatment depend on your symptoms and the type of bacteria causing your infection.  HOME CARE INSTRUCTIONS   If you were prescribed antibiotics, take them exactly as your caregiver instructs you. Finish the medication even if you feel better after you  have only taken some of the medication.   Drink enough water and fluids to keep your urine clear or pale yellow.   Avoid caffeine, tea, and carbonated beverages. They tend to irritate your bladder.   Empty your bladder often. Avoid holding urine for long periods of time.   Empty your bladder before and after sexual intercourse.   After a bowel movement, women should cleanse from front to back. Use each tissue only once.  SEEK MEDICAL CARE IF:    You have back pain.   You develop a fever.   Your symptoms do not begin to resolve within 3 days.  SEEK IMMEDIATE MEDICAL CARE IF:    You have severe back pain or lower abdominal pain.   You develop chills.   You have nausea or vomiting.   You have continued burning or discomfort with urination.  MAKE SURE YOU:    Understand these instructions.   Will watch your condition.   Will get help right away if you are not doing well or get worse.  Document Released: 03/18/2005 Document Revised: 12/08/2011 Document Reviewed: 07/17/2011  [employer] Patient Information 2014 [employer].

## 2013-06-10 NOTE — Progress Notes (Signed)
Subjective:  This chart was scribed for Terri Chris, MD by Terri Hurley, ED scribe.  This patient was seen in room [facility] Room 4 and the patient's care was started at 2:37 PM.   Patient ID: Terri Hurley, female    DOB: 14-Jan-1961, 52 y.o.   MRN: [id number]  Chief Complaint  Patient presents with  . Urinary Tract Infection    Can't urinate, pain in top of vagina  x last 3 days    HPI  HPI Comments: Terri Hurley is a 52 y.o. female with h/o urinary incontinence and recurrent UTIs who presents complaining of 3 days of persistent worsening "UTI symptoms" including frequency, difficulty with urine stream, and dysuria.  Pt states that she is having to urinate frequently but when she attempts to urinate she has difficulty voiding and experiences pain "on top of my vagina."  She denies burning or itching.  She has attempted to treat symptoms with AZO, without relief.  Pt has h/o recurrent UTIs which she states she usually treats with AZO.  She has had a bladder sling placed for urinary incontinence, with no relief.  She recently recovered from a sinus infection and was taking OTC medications.  She has not been on antibiotics recently.   Patient Active Problem List   Diagnosis Date Noted  . Low back pain radiating to left leg 11/19/2011  . Routine health maintenance 11/19/2011  . Obesity, Class II, BMI 35-39.9 11/18/2011  . Incontinence of urine 12/26/2010  . Arthritis   . Depression   . GERD (gastroesophageal reflux disease)   . Fatty liver disease, nonalcoholic   . HNP (herniated nucleus pulposus), cervical   . Hepatitis   . Recurrent genital HSV (herpes simplex virus) infection   . Plantar fasciitis   . Elevated liver enzymes 12/12/2010  . Vitamin D deficiency 12/12/2010  . Paresthesia 12/12/2010    Past Medical History  Diagnosis Date  . Arthritis     low back, hip, hands  . Depression     Sees Terri Aho, NP @ [facility]  counseling center.Marland Kitchen Has h/o hospitalization    . GERD (gastroesophageal reflux disease)     controlled with PPI therapy  . Fatty liver disease, nonalcoholic     clinical diagnosis by endocrinologist  . High cholesterol     No medical therapy. Last lab March '12  LDL 91, T. Chol 170. Minimal elevation in '08  . Vitamin D deficiency     lab '09  Vit D = 19  . Urinary incontinence   . HNP (herniated nucleus pulposus), cervical     C6-7. Has had PT, no surgery  . Hepatitis   . Plantar fasciitis   . Recurrent genital HSV (herpes simplex virus) infection     on daily suppression with acyclovir    Past Surgical History  Procedure Laterality Date  . Incontinence surgery      2009  . Cholecystectomy    . Abdominal hysterectomy      Abnormal PAP, cystocele  . Total abdominal hysterectomy w/ bilateral salpingoophorectomy      Prior to Admission medications   Medication Sig Start Date End Date Taking? Authorizing Provider  acyclovir (ZOVIRAX) 400 MG tablet Take 1 tablet (400 mg total) by mouth daily. 11/21/12  Yes Jacques Navy, MD  buPROPion (WELLBUTRIN XL) 300 MG 24 hr tablet Take 1 tablet (300 mg total) by mouth daily. 11/21/12  Yes Jacques Navy, MD  escitalopram (LEXAPRO) 10 MG tablet Take  1 tablet (10 mg total) by mouth daily. 11/21/12  Yes Jacques Navy, MD  omeprazole (PRILOSEC) 40 MG capsule Take 1 capsule (40 mg total) by mouth daily. 11/21/12  Yes Jacques Navy, MD  senna-docusate (SENOKOT-S) 8.6-50 MG per tablet Take 1 tablet by mouth daily.   Yes Historical Provider, MD      Review of Systems  Constitutional: Negative for fever and chills.  Genitourinary: Positive for dysuria, frequency, difficulty urinating and vaginal pain.        Objective:   Physical Exam CONSTITUTIONAL: Well developed/well nourished HEAD: Normocephalic/atraumatic EYES: EOMI/PERRL ENMT: Mucous membranes moist NECK: supple no meningeal signs SPINE:entire spine nontender CV: S1/S2 noted, no murmurs/rubs/gallops noted LUNGS: Lungs are  clear to auscultation bilaterally, no apparent distress ABDOMEN: soft, nontender, no rebound or guarding GU:no cva tenderness NEURO: Pt is awake/alert, moves all extremitiesx4 EXTREMITIES: pulses normal, full ROM SKIN: warm, color normal PSYCH: no abnormalities of mood noted Results for orders placed in visit on 06/10/13  POCT URINALYSIS DIPSTICK      Result Value Range   Color, UA yellow     Clarity, UA cloudy     Glucose, UA neg     Bilirubin, UA neg     Ketones, UA neg     Spec Grav, UA 1.015     Blood, UA mod     pH, UA 7.0     Protein, UA 30     Urobilinogen, UA 0.2     Nitrite, UA neg     Leukocytes, UA large (3+)    POCT UA - MICROSCOPIC ONLY      Result Value Range   WBC, Ur, HPF, POC TNTC     RBC, urine, microscopic TNTC     Bacteria, U Microscopic 2+     Mucus, UA neg     Epithelial cells, urine per micros neg     Crystals, Ur, HPF, POC neg     Casts, Ur, LPF, POC neg     Yeast, UA neg      BP 126/104  Pulse 79  Temp(Src) 98.7 F (37.1 C) (Oral)  Resp 18  Ht 5' 1.5" (1.562 m)  Wt 187 lb (84.823 kg)  BMI 34.77 kg/m2  SpO2 97%        Assessment & Plan:  Patient is too numerous to count red cells and white cells we'll treat with Cipro for 5    I personally performed the services described in this documentation, which was scribed in my presence. The recorded information has been reviewed and is accurate.

## 2013-06-11 LAB — URINE CULTURE: Colony Count: 6000

## 2013-11-30 ENCOUNTER — Other Ambulatory Visit: Payer: Self-pay | Admitting: *Deleted

## 2013-11-30 MED ORDER — ESCITALOPRAM OXALATE 10 MG PO TABS: 10.0000 mg | ORAL_TABLET | Freq: Every day | ORAL | Status: DC

## 2013-12-01 ENCOUNTER — Encounter: Payer: Self-pay | Admitting: Internal Medicine

## 2013-12-01 ENCOUNTER — Ambulatory Visit (INDEPENDENT_AMBULATORY_CARE_PROVIDER_SITE_OTHER): Payer: BC Managed Care – PPO | Admitting: Internal Medicine

## 2013-12-01 ENCOUNTER — Other Ambulatory Visit (INDEPENDENT_AMBULATORY_CARE_PROVIDER_SITE_OTHER): Payer: BC Managed Care – PPO

## 2013-12-01 VITALS — BP 120/82 | HR 67 | Temp 98.8°F | Ht 62.0 in | Wt 194.8 lb

## 2013-12-01 DIAGNOSIS — R32 Unspecified urinary incontinence: Secondary | ICD-10-CM

## 2013-12-01 DIAGNOSIS — M79672 Pain in left foot: Secondary | ICD-10-CM

## 2013-12-01 DIAGNOSIS — Z0000 Encounter for general adult medical examination without abnormal findings: Secondary | ICD-10-CM

## 2013-12-01 DIAGNOSIS — M79671 Pain in right foot: Secondary | ICD-10-CM

## 2013-12-01 DIAGNOSIS — M79609 Pain in unspecified limb: Secondary | ICD-10-CM

## 2013-12-01 DIAGNOSIS — Z1239 Encounter for other screening for malignant neoplasm of breast: Secondary | ICD-10-CM

## 2013-12-01 LAB — CBC WITH DIFFERENTIAL/PLATELET
Basophils Absolute: 0 10*3/uL (ref 0.0–0.1)
Basophils Relative: 0.5 % (ref 0.0–3.0)
Eosinophils Absolute: 0 10*3/uL (ref 0.0–0.7)
Eosinophils Relative: 0.6 % (ref 0.0–5.0)
HCT: 41.5 % (ref 36.0–46.0)
Hemoglobin: 13.9 g/dL (ref 12.0–15.0)
Lymphocytes Relative: 20.3 % (ref 12.0–46.0)
Lymphs Abs: 1.3 10*3/uL (ref 0.7–4.0)
MCHC: 33.4 g/dL (ref 30.0–36.0)
MCV: 92.1 fl (ref 78.0–100.0)
Monocytes Absolute: 0.4 10*3/uL (ref 0.1–1.0)
Monocytes Relative: 7 % (ref 3.0–12.0)
Neutro Abs: 4.5 10*3/uL (ref 1.4–7.7)
Neutrophils Relative %: 71.6 % (ref 43.0–77.0)
Platelets: 252 10*3/uL (ref 150.0–400.0)
RBC: 4.5 Mil/uL (ref 3.87–5.11)
RDW: 13.8 % (ref 11.5–15.5)
WBC: 6.3 10*3/uL (ref 4.0–10.5)

## 2013-12-01 LAB — LIPID PANEL
Cholesterol: 201 mg/dL — ABNORMAL HIGH (ref 0–200)
HDL: 60.5 mg/dL (ref >39.00)
LDL Cholesterol: 123 mg/dL — ABNORMAL HIGH (ref 0–99)
NonHDL: 140.5
Total CHOL/HDL Ratio: 3
Triglycerides: 86 mg/dL (ref 0.0–149.0)
VLDL: 17.2 mg/dL (ref 0.0–40.0)

## 2013-12-01 LAB — BASIC METABOLIC PANEL
BUN: 12 mg/dL (ref 6–23)
CO2: 28 mEq/L (ref 19–32)
Calcium: 9.1 mg/dL (ref 8.4–10.5)
Chloride: 105 mEq/L (ref 96–112)
Creatinine, Ser: 1.1 mg/dL (ref 0.4–1.2)
GFR: 54.49 mL/min — ABNORMAL LOW (ref >60.00)
Glucose, Bld: 102 mg/dL — ABNORMAL HIGH (ref 70–99)
Potassium: 4.9 mEq/L (ref 3.5–5.1)
Sodium: 139 mEq/L (ref 135–145)

## 2013-12-01 LAB — HEPATIC FUNCTION PANEL
ALT: 60 U/L — ABNORMAL HIGH (ref 0–35)
AST: 45 U/L — ABNORMAL HIGH (ref 0–37)
Albumin: 3.9 g/dL (ref 3.5–5.2)
Alkaline Phosphatase: 85 U/L (ref 39–117)
Bilirubin, Direct: 0.1 mg/dL (ref 0.0–0.3)
Total Bilirubin: 0.6 mg/dL (ref 0.2–1.2)
Total Protein: 7.4 g/dL (ref 6.0–8.3)

## 2013-12-01 LAB — URINALYSIS, ROUTINE W REFLEX MICROSCOPIC
Bilirubin Urine: NEGATIVE
Hgb urine dipstick: NEGATIVE
Ketones, ur: NEGATIVE
Leukocytes, UA: NEGATIVE
Nitrite: NEGATIVE
RBC / HPF: NONE SEEN (ref 0–?)
Specific Gravity, Urine: 1.025 (ref 1.000–1.030)
Total Protein, Urine: NEGATIVE
Urine Glucose: NEGATIVE
Urobilinogen, UA: 0.2 (ref 0.0–1.0)
pH: 6.5 (ref 5.0–8.0)

## 2013-12-01 LAB — TSH: TSH: 1.19 u[IU]/mL (ref 0.35–4.50)

## 2013-12-01 MED ORDER — OMEPRAZOLE 40 MG PO CPDR: 40.0000 mg | DELAYED_RELEASE_CAPSULE | Freq: Every day | ORAL | Status: DC

## 2013-12-01 MED ORDER — ESCITALOPRAM OXALATE 10 MG PO TABS: 10.0000 mg | ORAL_TABLET | Freq: Every day | ORAL | Status: DC

## 2013-12-01 MED ORDER — BUPROPION HCL ER (XL) 300 MG PO TB24: 300.0000 mg | ORAL_TABLET | Freq: Every day | ORAL | Status: DC

## 2013-12-01 MED ORDER — ACYCLOVIR 400 MG PO TABS
400.0000 mg | ORAL_TABLET | Freq: Every day | ORAL | Status: DC
Start: 2013-12-01 — End: 2014-07-21

## 2013-12-01 NOTE — Patient Instructions (Addendum)
It was good to see you today.  We have reviewed your prior records including labs and tests today  Health Maintenance reviewed - all recommended immunizations and age-appropriate screenings are up-to-date.  Test(s) ordered today. Your results will be released to [place] (or called to you) after review, usually within 72hours after test completion. If any changes need to be made, you will be notified at that same time.  Medications reviewed and updated, no changes recommended at this time. Refill on medication(s) as discussed today.  we'll make referral to female urologist, podiatrist for your feet and screening mammogram . Our office will contact you regarding appointment(s) once made.  Please schedule followup in 12 months for annual exam and labs, call sooner if problems.  Health Maintenance, Female A healthy lifestyle and preventative care can promote health and wellness.  Maintain regular health, dental, and eye exams.  Eat a healthy diet. Foods like vegetables, fruits, whole grains, low-fat dairy products, and lean protein foods contain the nutrients you need without too many calories. Decrease your intake of foods high in solid fats, added sugars, and salt. Get information about a proper diet from your caregiver, if necessary.  Regular physical exercise is one of the most important things you can do for your health. Most adults should get at least 150 minutes of moderate-intensity exercise (any activity that increases your heart rate and causes you to sweat) each week. In addition, most adults need muscle-strengthening exercises on 2 or more days a week.   Maintain a healthy weight. The body mass index (BMI) is a screening tool to identify possible weight problems. It provides an estimate of body fat based on height and weight. Your caregiver can help determine your BMI, and can help you achieve or maintain a healthy weight. For adults 20 years and older:  A BMI below 18.5 is  considered underweight.  A BMI of 18.5 to 24.9 is normal.  A BMI of 25 to 29.9 is considered overweight.  A BMI of 30 and above is considered obese.  Maintain normal blood lipids and cholesterol by exercising and minimizing your intake of saturated fat. Eat a balanced diet with plenty of fruits and vegetables. Blood tests for lipids and cholesterol should begin at age 37 and be repeated every 5 years. If your lipid or cholesterol levels are high, you are over 50, or you are a high risk for heart disease, you may need your cholesterol levels checked more frequently.Ongoing high lipid and cholesterol levels should be treated with medicines if diet and exercise are not effective.  If you smoke, find out from your caregiver how to quit. If you do not use tobacco, do not start.  Lung cancer screening is recommended for adults aged 34 80 years who are at high risk for developing lung cancer because of a history of smoking. Yearly low-dose computed tomography (CT) is recommended for people who have at least a 30-pack-year history of smoking and are a current smoker or have quit within the past 15 years. A pack year of smoking is smoking an average of 1 pack of cigarettes a day for 1 year (for example: 1 pack a day for 30 years or 2 packs a day for 15 years). Yearly screening should continue until the smoker has stopped smoking for at least 15 years. Yearly screening should also be stopped for people who develop a health problem that would prevent them from having lung cancer treatment.  If you are pregnant, do not drink  alcohol. If you are breastfeeding, be very cautious about drinking alcohol. If you are not pregnant and choose to drink alcohol, do not exceed 1 drink per day. One drink is considered to be 12 ounces (355 mL) of beer, 5 ounces (148 mL) of wine, or 1.5 ounces (44 mL) of liquor.  Avoid use of street drugs. Do not share needles with anyone. Ask for help if you need support or instructions  about stopping the use of drugs.  High blood pressure causes heart disease and increases the risk of stroke. Blood pressure should be checked at least every 1 to 2 years. Ongoing high blood pressure should be treated with medicines, if weight loss and exercise are not effective.  If you are 42 to 54 years old, ask your caregiver if you should take aspirin to prevent strokes.  Diabetes screening involves taking a blood sample to check your fasting blood sugar level. This should be done once every 3 years, after age 75, if you are within normal weight and without risk factors for diabetes. Testing should be considered at a younger age or be carried out more frequently if you are overweight and have at least 1 risk factor for diabetes.  Breast cancer screening is essential preventative care for women. You should practice "breast self-awareness." This means understanding the normal appearance and feel of your breasts and may include breast self-examination. Any changes detected, no matter how small, should be reported to a caregiver. Women in their 42s and 30s should have a clinical breast exam (CBE) by a caregiver as part of a regular health exam every 1 to 3 years. After age 75, women should have a CBE every year. Starting at age 49, women should consider having a mammogram (breast X-ray) every year. Women who have a family history of breast cancer should talk to their caregiver about genetic screening. Women at a high risk of breast cancer should talk to their caregiver about having an MRI and a mammogram every year.  Breast cancer gene (BRCA)-related cancer risk assessment is recommended for women who have family members with BRCA-related cancers. BRCA-related cancers include breast, ovarian, tubal, and peritoneal cancers. Having family members with these cancers may be associated with an increased risk for harmful changes (mutations) in the breast cancer genes BRCA1 and BRCA2. Results of the assessment  will determine the need for genetic counseling and BRCA1 and BRCA2 testing.  The Pap test is a screening test for cervical cancer. Women should have a Pap test starting at age 48. Between ages 75 and 70, Pap tests should be repeated every 2 years. Beginning at age 64, you should have a Pap test every 3 years as long as the past 3 Pap tests have been normal. If you had a hysterectomy for a problem that was not cancer or a condition that could lead to cancer, then you no longer need Pap tests. If you are between ages 29 and 87, and you have had normal Pap tests going back 10 years, you no longer need Pap tests. If you have had past treatment for cervical cancer or a condition that could lead to cancer, you need Pap tests and screening for cancer for at least 20 years after your treatment. If Pap tests have been discontinued, risk factors (such as a new sexual partner) need to be reassessed to determine if screening should be resumed. Some women have medical problems that increase the chance of getting cervical cancer. In these cases, your caregiver may  recommend more frequent screening and Pap tests.  The human papillomavirus (HPV) test is an additional test that may be used for cervical cancer screening. The HPV test looks for the virus that can cause the cell changes on the cervix. The cells collected during the Pap test can be tested for HPV. The HPV test could be used to screen women aged 20 years and older, and should be used in women of any age who have unclear Pap test results. After the age of 70, women should have HPV testing at the same frequency as a Pap test.  Colorectal cancer can be detected and often prevented. Most routine colorectal cancer screening begins at the age of 29 and continues through age 65. However, your caregiver may recommend screening at an earlier age if you have risk factors for colon cancer. On a yearly basis, your caregiver may provide home test kits to check for hidden blood  in the stool. Use of a small camera at the end of a tube, to directly examine the colon (sigmoidoscopy or colonoscopy), can detect the earliest forms of colorectal cancer. Talk to your caregiver about this at age 26, when routine screening begins. Direct examination of the colon should be repeated every 5 to 10 years through age 63, unless early forms of pre-cancerous polyps or small growths are found.  Hepatitis C blood testing is recommended for all people born from [birth year] through 1966 and any individual with known risks for hepatitis C.  Practice safe sex. Use condoms and avoid high-risk sexual practices to reduce the spread of sexually transmitted infections (STIs). Sexually active women aged 61 and younger should be checked for Chlamydia, which is a common sexually transmitted infection. Older women with new or multiple partners should also be tested for Chlamydia. Testing for other STIs is recommended if you are sexually active and at increased risk.  Osteoporosis is a disease in which the bones lose minerals and strength with aging. This can result in serious bone fractures. The risk of osteoporosis can be identified using a bone density scan. Women ages 74 and over and women at risk for fractures or osteoporosis should discuss screening with their caregivers. Ask your caregiver whether you should be taking a calcium supplement or vitamin D to reduce the rate of osteoporosis.  Menopause can be associated with physical symptoms and risks. Hormone replacement therapy is available to decrease symptoms and risks. You should talk to your caregiver about whether hormone replacement therapy is right for you.  Use sunscreen. Apply sunscreen liberally and repeatedly throughout the day. You should seek shade when your shadow is shorter than you. Protect yourself by wearing long sleeves, pants, a wide-brimmed hat, and sunglasses year round, whenever you are outdoors.  Notify your caregiver of new moles or  changes in moles, especially if there is a change in shape or color. Also notify your caregiver if a mole is larger than the size of a pencil eraser.  Stay current with your immunizations. Document Released: 12/22/2010 Document Revised: 10/03/2012 Document Reviewed: 12/22/2010 [facility] Patient Information 2014 [place].

## 2013-12-01 NOTE — Assessment & Plan Note (Signed)
Prior cystocele repair 2009 Multiple chronic problems including severe pain relieved with micturition. associated with painful intercourse as well Prior local uro eval "not helpful" - pt requests new opinion from female provider - will arrange same ?possible IC Support offered

## 2013-12-01 NOTE — Progress Notes (Signed)
Pre visit review using our clinic review tool, if applicable. No additional management support is needed unless otherwise documented below in the visit note. 

## 2013-12-01 NOTE — Progress Notes (Signed)
Subjective:    Patient ID: Terri Hurley, female    DOB: [birth year], 53 y.o.   MRN: [id number]  HPI  New to me, transfer from [facility] due to retirement -  patient is here today for annual physical. Patient feels well in general -  Also reviewed chronic medical issues and interval medical events  Past Medical History  Diagnosis Date  . Arthritis     low back, hip, hands  . Depression     Sees [name], NP @ [facility]  counseling center.Marland Kitchen Has h/o hospitalization  . GERD (gastroesophageal reflux disease)     controlled with PPI therapy  . Fatty liver disease, nonalcoholic     clinical diagnosis by endocrinologist  . High cholesterol     No medical therapy. Last lab March '12  LDL 91, T. Chol 170. Minimal elevation in '08  . Vitamin D deficiency     lab '09  Vit D = 19  . Urinary incontinence   . HNP (herniated nucleus pulposus), cervical     C6-7. Has had PT, no surgery  . Hepatitis   . Plantar fasciitis   . Recurrent genital HSV (herpes simplex virus) infection     on daily suppression with acyclovir   Family History  Problem Relation Age of Onset  . Heart disease Mother   . Diabetes Mother     peripheral neuropathy  . Hypertension Mother   . Hyperlipidemia Mother   . Diabetes Father   . Heart disease Brother     arrythmia, DCC  . Cancer Sister     breast cancer  . Asthma Sister   . Thyroid disease Sister   . COPD Sister   . COPD Brother   . Stroke Brother    History  Substance Use Topics  . Smoking status: Never Smoker   . Smokeless tobacco: Never Used  . Alcohol Use: No    Review of Systems  Constitutional: Positive for fatigue. Negative for unexpected weight change.  Respiratory: Negative for cough, shortness of breath and wheezing.   Cardiovascular: Negative for chest pain, palpitations and leg swelling.  Gastrointestinal: Negative for nausea, abdominal pain and diarrhea.  Genitourinary: Positive for urgency, frequency, vaginal pain (with  intercourse), pelvic pain and dyspareunia. Negative for dysuria, hematuria, flank pain and decreased urine volume.  Musculoskeletal: Positive for arthralgias and myalgias. Negative for joint swelling.       B feet pain with prolonged standing - different than prior plantar fascitis pain - ongoing 3 mo   Neurological: Negative for dizziness, weakness, light-headedness and headaches.  Psychiatric/Behavioral: Negative for dysphoric mood. The patient is not nervous/anxious.   All other systems reviewed and are negative.      Objective:   Physical Exam  BP 120/82  Pulse 67  Temp(Src) 98.8 F (37.1 C) (Oral)  Ht 5\' 2"  (1.575 m)  Wt 194 lb 12.8 oz (88.361 kg)  BMI 35.62 kg/m2  SpO2 97% Wt Readings from Last 3 Encounters:  12/01/13 194 lb 12.8 oz (88.361 kg)  06/10/13 187 lb (84.823 kg)  11/21/12 192 lb 6.4 oz (87.272 kg)   Constitutional: She is overweight, but appears well-developed and well-nourished. No distress.  HENT: Head: Normocephalic and atraumatic. Ears: B TMs ok, no erythema or effusion; Nose: Nose normal. Mouth/Throat: Oropharynx is clear and moist. No oropharyngeal exudate.  Eyes: Conjunctivae and EOM are normal. Pupils are equal, round, and reactive to light. No scleral icterus.  Neck: Normal range of motion. Neck supple. No  JVD present. No thyromegaly present.  Cardiovascular: Normal rate, regular rhythm and normal heart sounds.  No murmur heard. No BLE edema. Pulmonary/Chest: Effort normal and breath sounds normal. No respiratory distress. She has no wheezes.  Abdominal: Soft. Bowel sounds are normal. She exhibits no distension. There is no tenderness. no masses GU: defer to uro/gyn Musculoskeletal: Normal range of motion, no joint effusions. No gross deformities - B feet without effusion or soft tissue swelling- nontender Neurological: She is alert and oriented to person, place, and time. No cranial nerve deficit. Coordination, balance, strength, speech and gait are  normal.  Skin: Skin is warm and dry. No rash noted. No erythema.  Psychiatric: She has a normal mood and affect. Her behavior is normal. Judgment and thought content normal.    Lab Results  Component Value Date   WBC 5.4 11/18/2011   HGB 13.6 11/18/2011   HCT 41.2 11/18/2011   PLT 264.0 11/18/2011   GLUCOSE 92 11/21/2012   CHOL 201* 11/21/2012   TRIG 112.0 11/21/2012   HDL 51.90 11/21/2012   LDLDIRECT 130.7 11/21/2012   ALT 38* 11/21/2012   ALT 38* 11/21/2012   AST 27 11/21/2012   AST 27 11/21/2012   NA 141 11/21/2012   K 3.9 11/21/2012   CL 107 11/21/2012   CREATININE 1.0 11/21/2012   BUN 9 11/21/2012   CO2 27 11/21/2012   TSH 1.01 11/18/2011   HGBA1C 5.5 11/18/2011    Mr Lumbar Spine Wo Contrast  11/05/2011   *RADIOLOGY REPORT*  Clinical Data: Low back pain.  Leg pain.  Left greater than right leg pain.  MRI LUMBAR SPINE WITHOUT CONTRAST  Technique:  Multiplanar and multiecho pulse sequences of the lumbar spine were obtained without intravenous contrast.  Comparison: None.  Findings: The numbering convention used for this exam terms L5-S1 as the last full intervertebral disc space above the sacrum. Vertebral body height is preserved.  Marrow signal is within normal limits.  Paraspinal soft tissues are within normal limits.  Spinal cord terminates posterior to T12-L1.  T11-T12 through L1-L2 normal.  L2-L3:  Mild disc desiccation with shallow circumferential disc bulge.  No stenosis.  L3-L4:  Disc desiccation with shallow circumferential disc bulge. No stenosis.  L4-L5:  Tiny right posterolateral protrusion and mild right foraminal stenosis. Right foraminal and posterolateral annular tear.  No neural compression.  Lateral disc bulging is also present.  Central canal, lateral recesses and left foramen are patent.  L5-S1:  Negative.  IMPRESSION: Mild lumbar spondylosis as described above.  No discrete left-sided lesions are present to account for left greater than right lower extremity radiculopathy.  Original Report  Authenticated By: Dereck Ligas, M.D.   ECG today -normal sinus at 70 beats per minute. Negative precordial T., no arrhythmia    Assessment & Plan:   CPX/v70.0 - Patient has been counseled on age-appropriate routine health concerns for screening and prevention. These are reviewed and up-to-date. Immunizations are up-to-date or declined. Labs and ECG reviewed.  B foot pain - refer to podiatry  Problem List Items Addressed This Visit   Incontinence of urine     Prior cystocele repair 2009 Multiple chronic problems including severe pain relieved with micturition. associated with painful intercourse as well Prior local uro eval "not helpful" - pt requests new opinion from female provider - will arrange same ?possible IC Support offered    Relevant Orders      Ambulatory referral to Urology    Other Visit Diagnoses   Routine general  medical examination at a health care facility    -  Primary    Relevant Orders       EKG 12-Lead (Completed)       Basic metabolic panel       CBC with Differential       Hepatic function panel       Lipid panel       TSH       Urinalysis, Routine w reflex microscopic    Other screening breast examination        Relevant Orders       MM DIGITAL SCREENING BILATERAL    Foot pain, bilateral        Relevant Orders       Ambulatory referral to Podiatry

## 2013-12-15 ENCOUNTER — Ambulatory Visit
Admission: RE | Admit: 2013-12-15 | Discharge: 2013-12-15 | Disposition: A | Discharge status: Home / Self Care | Payer: BC Managed Care – PPO | Source: Ambulatory Visit | Attending: Internal Medicine | Admitting: Internal Medicine

## 2013-12-15 DIAGNOSIS — Z1239 Encounter for other screening for malignant neoplasm of breast: Secondary | ICD-10-CM

## 2013-12-19 ENCOUNTER — Ambulatory Visit: Payer: BC Managed Care – PPO | Admitting: Podiatry

## 2014-01-04 ENCOUNTER — Ambulatory Visit (INDEPENDENT_AMBULATORY_CARE_PROVIDER_SITE_OTHER): Payer: BC Managed Care – PPO

## 2014-01-04 ENCOUNTER — Ambulatory Visit (INDEPENDENT_AMBULATORY_CARE_PROVIDER_SITE_OTHER): Payer: BC Managed Care – PPO | Admitting: Podiatry

## 2014-01-04 ENCOUNTER — Encounter: Payer: Self-pay | Admitting: Podiatry

## 2014-01-04 VITALS — BP 153/84 | HR 65 | Resp 15 | Ht 62.0 in | Wt 195.0 lb

## 2014-01-04 DIAGNOSIS — M722 Plantar fascial fibromatosis: Secondary | ICD-10-CM

## 2014-01-04 MED ORDER — METHYLPREDNISOLONE (PAK) 4 MG PO TABS: ORAL_TABLET | ORAL | Status: DC

## 2014-01-04 NOTE — Progress Notes (Signed)
   Subjective:    Patient ID: Terri Hurley, female    DOB: 07/08/1960, 53 y.o.   MRN: [id number]  HPI Comments: Pt complains of aching in plantar feet while standing and throbbing heel pain on occasion, for many years.  Pt states has worsened in the last 2 to 3 months.  Pt is a [employer].     Review of Systems  Endocrine: Positive for cold intolerance and heat intolerance.  Musculoskeletal: Positive for arthralgias, back pain, gait problem and myalgias.  Neurological: Positive for numbness.  All other systems reviewed and are negative.      Objective:   Physical Exam: I have reviewed her past mental history medications allergies surgeries social history and review of systems. Pulses are strongly palpable bilateral. Neurologic sensorium is intact per Semmes-Weinstein monofilament. Deep tendon reflexes are intact bilateral muscle strength is 5 over 5 dorsiflexors plantar flexors inverters everters all intrinsic musculature is intact. Orthopedic evaluation demonstrates all joints distal to the ankle a full range of motion without crepitus has pain on palpation medial calcaneal tubercles of the bilateral heels. Radiographic evaluation demonstrates plantar distally oriented calcaneal heel spurs with soft tissue increase in density at the plantar fascial calcaneal insertion site. Otherwise plantar fasciitis appears to be the diagnosis.        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  Plan: I offered her injections which she declined. We discussed the possible need for new orthotics. I wrote a prescription for Medrol Dosepak.

## 2014-01-23 ENCOUNTER — Ambulatory Visit (INDEPENDENT_AMBULATORY_CARE_PROVIDER_SITE_OTHER): Payer: BC Managed Care – PPO

## 2014-01-23 ENCOUNTER — Ambulatory Visit (INDEPENDENT_AMBULATORY_CARE_PROVIDER_SITE_OTHER): Payer: BC Managed Care – PPO | Admitting: Family Medicine

## 2014-01-23 VITALS — BP 130/80 | HR 67 | Temp 98.4°F | Resp 16 | Ht 62.0 in | Wt 196.0 lb

## 2014-01-23 DIAGNOSIS — M791 Myalgia, unspecified site: Secondary | ICD-10-CM

## 2014-01-23 DIAGNOSIS — R748 Abnormal levels of other serum enzymes: Secondary | ICD-10-CM

## 2014-01-23 DIAGNOSIS — R252 Cramp and spasm: Secondary | ICD-10-CM

## 2014-01-23 DIAGNOSIS — IMO0001 Reserved for inherently not codable concepts without codable children: Secondary | ICD-10-CM

## 2014-01-23 LAB — POCT URINALYSIS DIPSTICK
Bilirubin, UA: NEGATIVE
Blood, UA: NEGATIVE
Glucose, UA: NEGATIVE
Ketones, UA: NEGATIVE
Leukocytes, UA: NEGATIVE
Nitrite, UA: NEGATIVE
Protein, UA: NEGATIVE
Spec Grav, UA: <=1.005
Urobilinogen, UA: 0.2
pH, UA: 5.5

## 2014-01-23 MED ORDER — MELOXICAM 15 MG PO TABS: 15.0000 mg | ORAL_TABLET | Freq: Every day | ORAL | Status: DC

## 2014-01-23 MED ORDER — METHOCARBAMOL 500 MG PO TABS: 500.0000 mg | ORAL_TABLET | Freq: Every evening | ORAL | Status: DC | PRN

## 2014-01-23 NOTE — Patient Instructions (Signed)
Leg Cramps Leg cramps that occur during exercise can be caused by poor circulation or dehydration. However, muscle cramps that occur at rest or during the night are usually not due to any serious medical problem. Heat cramps may cause muscle spasms during hot weather.  CAUSES There is no clear cause for muscle cramps. However, dehydration may be a factor for those who do not drink enough fluids and those who exercise in the heat. Imbalances in the level of sodium, potassium, calcium or magnesium in the muscle tissue may also be a factor. Some medications, such as water pills (diuretics), may cause loss of chemicals that the body needs (like sodium and potassium) and cause muscle cramps. TREATMENT   Make sure your diet has enough fluids and essential minerals for the muscle to work normally.  Avoid strenuous exercise for several days if you have been having frequent leg cramps.  Stretch and massage the cramped muscle for several minutes.  Some medicines may be helpful in some patients with night cramps. Only take over-the-counter or prescription medicines as directed by your caregiver. SEEK IMMEDIATE MEDICAL CARE IF:   Your leg cramps become worse.  Your foot becomes cold, numb, or blue. Document Released: 07/16/2004 Document Revised: 08/31/2011 Document Reviewed: 07/03/2008 [employer] Patient Information 2015 [employer]. This information is not intended to replace advice given to you by your health care provider. Make sure you discuss any questions you have with your health care provider.  
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
## 2014-01-24 LAB — COMPREHENSIVE METABOLIC PANEL
ALT: 40 U/L — ABNORMAL HIGH (ref 0–35)
AST: 37 U/L (ref 0–37)
Albumin: 4.1 g/dL (ref 3.5–5.2)
Alkaline Phosphatase: 66 U/L (ref 39–117)
BUN: 16 mg/dL (ref 6–23)
CO2: 25 mEq/L (ref 19–32)
Calcium: 9.2 mg/dL (ref 8.4–10.5)
Chloride: 104 mEq/L (ref 96–112)
Creat: 1.08 mg/dL (ref 0.50–1.10)
Glucose, Bld: 84 mg/dL (ref 70–99)
Potassium: 4.5 mEq/L (ref 3.5–5.3)
Sodium: 137 mEq/L (ref 135–145)
Total Bilirubin: 0.3 mg/dL (ref 0.2–1.2)
Total Protein: 6.9 g/dL (ref 6.0–8.3)

## 2014-01-24 LAB — CBC
HCT: 37.6 % (ref 36.0–46.0)
Hemoglobin: 12.7 g/dL (ref 12.0–15.0)
MCH: 30 pg (ref 26.0–34.0)
MCHC: 33.8 g/dL (ref 30.0–36.0)
MCV: 88.7 fL (ref 78.0–100.0)
Platelets: 235 10*3/uL (ref 150–400)
RBC: 4.24 MIL/uL (ref 3.87–5.11)
RDW: 14.3 % (ref 11.5–15.5)
WBC: 5.9 10*3/uL (ref 4.0–10.5)

## 2014-01-24 LAB — SEDIMENTATION RATE: Sed Rate: 11 mm/hr (ref 0–22)

## 2014-01-24 LAB — CK: Total CK: 1219 U/L — ABNORMAL HIGH (ref 7–177)

## 2014-02-02 ENCOUNTER — Other Ambulatory Visit (INDEPENDENT_AMBULATORY_CARE_PROVIDER_SITE_OTHER): Payer: BC Managed Care – PPO | Admitting: Radiology

## 2014-02-02 DIAGNOSIS — IMO0001 Reserved for inherently not codable concepts without codable children: Secondary | ICD-10-CM

## 2014-02-02 DIAGNOSIS — M791 Myalgia, unspecified site: Secondary | ICD-10-CM

## 2014-02-02 DIAGNOSIS — R748 Abnormal levels of other serum enzymes: Secondary | ICD-10-CM

## 2014-02-02 NOTE — Progress Notes (Signed)
Pt here for labs only. 

## 2014-02-03 LAB — COMPREHENSIVE METABOLIC PANEL
ALT: 35 U/L (ref 0–35)
AST: 26 U/L (ref 0–37)
Albumin: 4.3 g/dL (ref 3.5–5.2)
Alkaline Phosphatase: 63 U/L (ref 39–117)
BUN: 13 mg/dL (ref 6–23)
CO2: 26 mEq/L (ref 19–32)
Calcium: 8.9 mg/dL (ref 8.4–10.5)
Chloride: 103 mEq/L (ref 96–112)
Creat: 0.96 mg/dL (ref 0.50–1.10)
Glucose, Bld: 81 mg/dL (ref 70–99)
Potassium: 4.5 mEq/L (ref 3.5–5.3)
Sodium: 140 mEq/L (ref 135–145)
Total Bilirubin: 0.5 mg/dL (ref 0.2–1.2)
Total Protein: 6.8 g/dL (ref 6.0–8.3)

## 2014-02-03 LAB — CK: Total CK: 126 U/L (ref 7–177)

## 2014-03-05 ENCOUNTER — Other Ambulatory Visit: Payer: Self-pay | Admitting: Family Medicine

## 2014-03-05 DIAGNOSIS — M791 Myalgia, unspecified site: Secondary | ICD-10-CM

## 2014-03-06 ENCOUNTER — Telehealth: Payer: Self-pay | Admitting: Family Medicine

## 2014-03-06 ENCOUNTER — Other Ambulatory Visit (INDEPENDENT_AMBULATORY_CARE_PROVIDER_SITE_OTHER): Payer: BC Managed Care – PPO | Admitting: Radiology

## 2014-03-06 DIAGNOSIS — M791 Myalgia, unspecified site: Secondary | ICD-10-CM

## 2014-03-06 DIAGNOSIS — IMO0001 Reserved for inherently not codable concepts without codable children: Secondary | ICD-10-CM

## 2014-03-06 MED ORDER — MELOXICAM 15 MG PO TABS: 15.0000 mg | ORAL_TABLET | Freq: Every day | ORAL | Status: DC

## 2014-03-06 MED ORDER — METHOCARBAMOL 500 MG PO TABS: 500.0000 mg | ORAL_TABLET | Freq: Every evening | ORAL | Status: DC | PRN

## 2014-03-06 NOTE — Progress Notes (Signed)
Pt here for labs only. 

## 2014-03-06 NOTE — Telephone Encounter (Signed)
Spoke with patient; having muscle aches again; requesting refill of medications. No recent exertion or excessive activity.  Having muscle aches again.  A/P: myalgias without recent exertion with recent history of elevated CK:  Agreeable to refilling medications but pt to present for labs today to evaluate CK level.  Orders placed in chart; pt to present today for lab only visit.

## 2014-03-07 LAB — COMPREHENSIVE METABOLIC PANEL
ALT: 37 U/L — ABNORMAL HIGH (ref 0–35)
AST: 25 U/L (ref 0–37)
Albumin: 4.2 g/dL (ref 3.5–5.2)
Alkaline Phosphatase: 69 U/L (ref 39–117)
BUN: 9 mg/dL (ref 6–23)
CO2: 26 mEq/L (ref 19–32)
Calcium: 8.8 mg/dL (ref 8.4–10.5)
Chloride: 104 mEq/L (ref 96–112)
Creat: 0.99 mg/dL (ref 0.50–1.10)
Glucose, Bld: 99 mg/dL (ref 70–99)
Potassium: 3.8 mEq/L (ref 3.5–5.3)
Sodium: 139 mEq/L (ref 135–145)
Total Bilirubin: 0.4 mg/dL (ref 0.2–1.2)
Total Protein: 6.8 g/dL (ref 6.0–8.3)

## 2014-03-07 LAB — CBC WITH DIFFERENTIAL/PLATELET
Basophils Absolute: 0 10*3/uL (ref 0.0–0.1)
Basophils Relative: 0 % (ref 0–1)
Eosinophils Absolute: 0.1 10*3/uL (ref 0.0–0.7)
Eosinophils Relative: 1 % (ref 0–5)
HCT: 38.3 % (ref 36.0–46.0)
Hemoglobin: 12.8 g/dL (ref 12.0–15.0)
Lymphocytes Relative: 30 % (ref 12–46)
Lymphs Abs: 1.7 10*3/uL (ref 0.7–4.0)
MCH: 29.6 pg (ref 26.0–34.0)
MCHC: 33.4 g/dL (ref 30.0–36.0)
MCV: 88.5 fL (ref 78.0–100.0)
Monocytes Absolute: 0.5 10*3/uL (ref 0.1–1.0)
Monocytes Relative: 9 % (ref 3–12)
Neutro Abs: 3.4 10*3/uL (ref 1.7–7.7)
Neutrophils Relative %: 60 % (ref 43–77)
Platelets: 267 10*3/uL (ref 150–400)
RBC: 4.33 MIL/uL (ref 3.87–5.11)
RDW: 14.2 % (ref 11.5–15.5)
WBC: 5.7 10*3/uL (ref 4.0–10.5)

## 2014-03-07 LAB — SEDIMENTATION RATE: Sed Rate: 6 mm/hr (ref 0–22)

## 2014-03-07 LAB — CK: Total CK: 154 U/L (ref 7–177)

## 2014-03-09 ENCOUNTER — Telehealth: Payer: Self-pay

## 2014-03-09 NOTE — Telephone Encounter (Signed)
Patient called stated the medications she was given for her pain in back and arm have not helped her. Patient feels she needs to be referred for an MRI of her back. Patient also feels she may need to get Xrays of her lower back and left arm/shoulder blade. Patients call back number is [phone]

## 2014-03-10 ENCOUNTER — Emergency Department (HOSPITAL_COMMUNITY)
Admission: EM | Admit: 2014-03-10 | Discharge: 2014-03-10 | Disposition: A | Discharge status: Home / Self Care | Payer: BC Managed Care – PPO | Attending: Emergency Medicine | Admitting: Emergency Medicine

## 2014-03-10 ENCOUNTER — Emergency Department (HOSPITAL_COMMUNITY): Payer: BC Managed Care – PPO

## 2014-03-10 ENCOUNTER — Encounter (HOSPITAL_COMMUNITY): Payer: Self-pay | Admitting: Emergency Medicine

## 2014-03-10 DIAGNOSIS — Z8639 Personal history of other endocrine, nutritional and metabolic disease: Secondary | ICD-10-CM | POA: Diagnosis not present

## 2014-03-10 DIAGNOSIS — F329 Major depressive disorder, single episode, unspecified: Secondary | ICD-10-CM | POA: Insufficient documentation

## 2014-03-10 DIAGNOSIS — Z880 Allergy status to penicillin: Secondary | ICD-10-CM | POA: Diagnosis not present

## 2014-03-10 DIAGNOSIS — Z862 Personal history of diseases of the blood and blood-forming organs and certain disorders involving the immune mechanism: Secondary | ICD-10-CM | POA: Diagnosis not present

## 2014-03-10 DIAGNOSIS — M79609 Pain in unspecified limb: Secondary | ICD-10-CM | POA: Diagnosis present

## 2014-03-10 DIAGNOSIS — Z79899 Other long term (current) drug therapy: Secondary | ICD-10-CM | POA: Diagnosis not present

## 2014-03-10 DIAGNOSIS — Z791 Long term (current) use of non-steroidal anti-inflammatories (NSAID): Secondary | ICD-10-CM | POA: Insufficient documentation

## 2014-03-10 DIAGNOSIS — Z8619 Personal history of other infectious and parasitic diseases: Secondary | ICD-10-CM | POA: Diagnosis not present

## 2014-03-10 DIAGNOSIS — M129 Arthropathy, unspecified: Secondary | ICD-10-CM | POA: Insufficient documentation

## 2014-03-10 DIAGNOSIS — K219 Gastro-esophageal reflux disease without esophagitis: Secondary | ICD-10-CM | POA: Diagnosis not present

## 2014-03-10 DIAGNOSIS — M79602 Pain in left arm: Secondary | ICD-10-CM

## 2014-03-10 DIAGNOSIS — M549 Dorsalgia, unspecified: Secondary | ICD-10-CM | POA: Insufficient documentation

## 2014-03-10 DIAGNOSIS — F3289 Other specified depressive episodes: Secondary | ICD-10-CM | POA: Insufficient documentation

## 2014-03-10 DIAGNOSIS — R079 Chest pain, unspecified: Secondary | ICD-10-CM | POA: Diagnosis not present

## 2014-03-10 LAB — BASIC METABOLIC PANEL
Anion gap: 14 (ref 5–15)
BUN: 13 mg/dL (ref 6–23)
CO2: 23 mEq/L (ref 19–32)
Calcium: 9 mg/dL (ref 8.4–10.5)
Chloride: 99 mEq/L (ref 96–112)
Creatinine, Ser: 0.97 mg/dL (ref 0.50–1.10)
GFR calc Af Amer: 75 mL/min — ABNORMAL LOW (ref >90)
GFR calc non Af Amer: 65 mL/min — ABNORMAL LOW (ref >90)
Glucose, Bld: 102 mg/dL — ABNORMAL HIGH (ref 70–99)
Potassium: 4.7 mEq/L (ref 3.7–5.3)
Sodium: 136 mEq/L — ABNORMAL LOW (ref 137–147)

## 2014-03-10 LAB — CBC WITH DIFFERENTIAL/PLATELET
Basophils Absolute: 0 10*3/uL (ref 0.0–0.1)
Basophils Relative: 1 % (ref 0–1)
Eosinophils Absolute: 0 10*3/uL (ref 0.0–0.7)
Eosinophils Relative: 1 % (ref 0–5)
HCT: 42.4 % (ref 36.0–46.0)
Hemoglobin: 14.8 g/dL (ref 12.0–15.0)
Lymphocytes Relative: 25 % (ref 12–46)
Lymphs Abs: 1.5 10*3/uL (ref 0.7–4.0)
MCH: 30.6 pg (ref 26.0–34.0)
MCHC: 34.9 g/dL (ref 30.0–36.0)
MCV: 87.6 fL (ref 78.0–100.0)
Monocytes Absolute: 0.5 10*3/uL (ref 0.1–1.0)
Monocytes Relative: 9 % (ref 3–12)
Neutro Abs: 3.8 10*3/uL (ref 1.7–7.7)
Neutrophils Relative %: 64 % (ref 43–77)
Platelets: 302 10*3/uL (ref 150–400)
RBC: 4.84 MIL/uL (ref 3.87–5.11)
RDW: 12.9 % (ref 11.5–15.5)
WBC: 5.9 10*3/uL (ref 4.0–10.5)

## 2014-03-10 LAB — I-STAT TROPONIN, ED: Troponin i, poc: 0 ng/mL (ref 0.00–0.08)

## 2014-03-10 LAB — CK: Total CK: 135 U/L (ref 7–177)

## 2014-03-10 LAB — TROPONIN I: Troponin I: <0.3 ng/mL (ref <0.30)

## 2014-03-10 MED ORDER — HYDROMORPHONE HCL 1 MG/ML IJ SOLN
1.0000 mg | Freq: Once | INTRAMUSCULAR | Status: AC
  Administered 2014-03-10: 1 mg via INTRAVENOUS
  Filled 2014-03-10: qty 1

## 2014-03-10 MED ORDER — OXYCODONE-ACETAMINOPHEN 5-325 MG PO TABS
2.0000 | ORAL_TABLET | Freq: Once | ORAL | Status: AC
  Administered 2014-03-10: 2 via ORAL
  Filled 2014-03-10: qty 2

## 2014-03-10 MED ORDER — FENTANYL CITRATE 0.05 MG/ML IJ SOLN
50.0000 ug | Freq: Once | INTRAMUSCULAR | Status: AC
  Administered 2014-03-10: 50 ug via INTRAVENOUS
  Filled 2014-03-10: qty 2

## 2014-03-10 MED ORDER — SODIUM CHLORIDE 0.9 % IV BOLUS (SEPSIS)
1000.0000 mL | Freq: Once | INTRAVENOUS | Status: AC
  Administered 2014-03-10: 1000 mL via INTRAVENOUS

## 2014-03-10 MED ORDER — OXYCODONE-ACETAMINOPHEN 5-325 MG PO TABS: 1.0000 | ORAL_TABLET | ORAL | Status: DC | PRN

## 2014-03-10 MED ORDER — NITROGLYCERIN 0.4 MG SL SUBL
0.4000 mg | SUBLINGUAL_TABLET | SUBLINGUAL | Status: AC | PRN
  Administered 2014-03-10 (×3): 0.4 mg via SUBLINGUAL
  Filled 2014-03-10: qty 1

## 2014-03-10 NOTE — ED Notes (Addendum)
Pt seen 01-23-14 for left chest, left, posterior left shoulder blade and intermittant jaw pain.  Pt was seen at u/c.  Pt pain returned 6 days ago and pt called urgent care who refilled her Mobic and Robaxin.  Pt called u/c back yesterday and this morning because pain med not working.  Pt was told to make office visit.  Pt came to ED for evaluation.

## 2014-03-10 NOTE — ED Notes (Signed)
Pt requesting pain medication; reports pain 10/10 after nitro

## 2014-03-10 NOTE — ED Notes (Addendum)
Pt gone to chest x ray

## 2014-03-10 NOTE — ED Notes (Signed)
Pt reports having no relief of pain from previous pain meds given in fast track.

## 2014-03-10 NOTE — ED Notes (Signed)
EKG done by tech

## 2014-03-10 NOTE — ED Provider Notes (Signed)
Pt with worsening L arm pain, constant, seen at Urgent Care, given pain medication, robaxin, with no improvement.  Now c/o intermittent L chest "aching", constant L arm pain with radiation to neck/jaw.  No dizziness, weakness, SOB, palpitations, nausea/vomiting, saddle anesthesia, bowel/bladder incontinence/retention.    PE:  Mild distress from pain, mildly diaphoretic. AOx4 and anxious Cardiovascular: RRR, no murmur Lungs: CTA bilaterally ABD: SNT Neuro: Full passive ROM of L arm with mild change in pain.  Motor strength 5/5 in major muscle groups of arms, grip strength.  Distal sensation intact. Patient's pain not reproducible with any passive or active range of motion.    Assessment:  I believe pt's pain is musculoskeletal, however pt's past Urgent care visit suggests pt was treated for global myalgias and leg cramps.  With intermittent chest pain radiating to L scapula accompanied by constant L arm pain slightly worse with movement, get the impression that pt's pain is musculoskeletal, however I feel she may need a cardiac r/o, and has an acuity which could be more proficiently addressed in the main ED.    Plan: Order CBC, BMP, Troponin, EKG, CXR, and XR neck to r/o cervical spine pathology.   Signed,  Dahlia Bailiff, PA-C 4:29 PM    Carrie Mew, PA-C 03/11/14 Lynnell Catalan

## 2014-03-10 NOTE — Telephone Encounter (Signed)
Spoke with pt. She says that she is in a lot of pain and that she is going to go to the ER. Dr. Tamala Julian notified.

## 2014-03-10 NOTE — ED Notes (Signed)
Pt on phone with brother asking for a ride due to receiving pain meds.

## 2014-03-10 NOTE — Telephone Encounter (Signed)
Patient called back again. States she is in a great deal of pain and the medications are not working. She is unable to use left arm and shoulder. Please return call.

## 2014-03-10 NOTE — ED Notes (Signed)
Pt reports having pain to left shoulder blade and left arm, started 4 weeks ago and went to ucc, started on meds which temporarily relieved her pain but it returned 6 days ago. Started taking meds again but no relief, pain increases with movement. No acute distress noted at triage.

## 2014-03-10 NOTE — ED Notes (Signed)
Received report from fast track nurse. Pt was seen 01-23-2014 at u/c for left arm and chest pain. Pt was given meds, which relieved the pain; however, the pain returned last Sunday per pt. Pt appears to be in moderate distress complaining of pain 10/10. Pt seems to be SOB but states it is due to severe pain in left arm. Pt keeps grabbing at arm.

## 2014-03-10 NOTE — Discharge Instructions (Signed)

## 2014-03-10 NOTE — ED Notes (Signed)
Received report from

## 2014-03-10 NOTE — Telephone Encounter (Signed)
Call --- needs OV. 

## 2014-03-10 NOTE — ED Notes (Signed)
Pt back from x-ray.

## 2014-03-11 NOTE — ED Provider Notes (Signed)
Medical screening examination/treatment/procedure(s) were performed by non-physician practitioner and as supervising physician I was immediately available for consultation/collaboration.   EKG Interpretation   Date/Time:  Saturday March 10 2014 19:39:56 EDT Ventricular Rate:  75 PR Interval:  145 QRS Duration: 90 QT Interval:  424 QTC Calculation: 474 R Axis:   66 Text Interpretation:  Normal sinus rhythm Borderline T wave abnormalities  Confirmed by BELFI  MD, MELANIE ([id number]) on 03/10/2014 7:45:03 PM        Jenny Reichmann Elba Barman III, MD 03/11/14 [phone]

## 2014-03-11 NOTE — ED Provider Notes (Signed)
CSN: [id number]     Arrival date & time 03/10/14  1531 History   First MD Initiated Contact with Patient 03/10/14 1554     Chief Complaint  Patient presents with  . Arm Pain     (Consider location/radiation/quality/duration/timing/severity/associated sxs/prior Treatment) HPI Comments: Pt presents with pain to left arm.  She describes it as a toothache like pain to left arm which raditates to left chest and left upper back.  It is worse with movement of the left arm.  No exertional symptoms.  No associated SOB, nausea, or diaphoresis.  No lightheadedness.  She had this pain about one month age and went to an [facility].  It was felt to be musculoskeletal in nature and was prescribed pain meds which improved the symptoms.  She states the pain came back again 6 days ago.  No cough or chest congestion.  No recent injuries or overuse of the area although she says that it started the first time after she was doing yard work.  She states that the pain has been constant over the last 6 days. She does have a hx of cervical disc dz, but cannot say that today's pain radiates from her neck.   Past Medical History  Diagnosis Date  . Arthritis     low back, hip, hands  . Depression     Sees Terri Moss, NP @ [facility]  counseling center.Marland Kitchen Has h/o hospitalization  . GERD (gastroesophageal reflux disease)     controlled with PPI therapy  . Fatty liver disease, nonalcoholic     clinical diagnosis by endocrinologist  . High cholesterol     No medical therapy. Last lab March '12  LDL 91, T. Chol 170. Minimal elevation in '08  . Vitamin D deficiency     lab '09  Vit D = 19  . Urinary incontinence   . HNP (herniated nucleus pulposus), cervical     C6-7. Has had PT, no surgery  . Hepatitis   . Plantar fasciitis   . Recurrent genital HSV (herpes simplex virus) infection     on daily suppression with acyclovir   Past Surgical History  Procedure Laterality Date  . Incontinence surgery  2009  .  Cholecystectomy    . Total abdominal hysterectomy w/ bilateral salpingoophorectomy  2009    with repair of cystocele and rectocele    Family History  Problem Relation Age of Onset  . Heart disease Mother   . Diabetes Mother     peripheral neuropathy  . Hypertension Mother   . Hyperlipidemia Mother   . Diabetes Father   . Heart disease Brother     arrythmia, DCC  . Cancer Sister     breast cancer  . Asthma Sister   . Thyroid disease Sister   . COPD Sister   . COPD Brother   . Stroke Brother    History  Substance Use Topics  . Smoking status: Never Smoker   . Smokeless tobacco: Never Used  . Alcohol Use: No   OB History   Grav Para Term Preterm Abortions TAB SAB Ect Mult Living                 Review of Systems  Constitutional: Negative for fever, chills, diaphoresis and fatigue.  HENT: Negative for congestion, rhinorrhea and sneezing.   Eyes: Negative.   Respiratory: Negative for cough, chest tightness and shortness of breath.   Cardiovascular: Positive for chest pain. Negative for leg swelling.  Gastrointestinal: Negative for nausea,  vomiting, abdominal pain, diarrhea and blood in stool.  Genitourinary: Negative for frequency, hematuria, flank pain and difficulty urinating.  Musculoskeletal: Positive for back pain and myalgias. Negative for arthralgias.  Skin: Negative for rash.  Neurological: Negative for dizziness, speech difficulty, weakness, numbness and headaches.      Allergies  Penicillins  Home Medications   Prior to Admission medications   Medication Sig Start Date End Date Taking? Authorizing Provider  acyclovir (ZOVIRAX) 400 MG tablet Take 1 tablet (400 mg total) by mouth daily. 12/01/13  Yes Rowe Clack, MD  buPROPion (WELLBUTRIN XL) 300 MG 24 hr tablet Take 1 tablet (300 mg total) by mouth daily. 12/01/13  Yes Rowe Clack, MD  escitalopram (LEXAPRO) 10 MG tablet Take 1 tablet (10 mg total) by mouth daily. 12/01/13  Yes Rowe Clack, MD  meloxicam (MOBIC) 15 MG tablet Take 1 tablet (15 mg total) by mouth daily. 03/06/14  Yes Wardell Honour, MD  methocarbamol (ROBAXIN) 500 MG tablet Take 1-2 tablets (500-1,000 mg total) by mouth at bedtime as needed for muscle spasms. 03/06/14  Yes Wardell Honour, MD  omeprazole (PRILOSEC) 40 MG capsule Take 1 capsule (40 mg total) by mouth daily. 12/01/13  Yes Rowe Clack, MD  senna-docusate (SENOKOT-S) 8.6-50 MG per tablet Take 1 tablet by mouth 2 (two) times daily.    Yes Historical Provider, MD  oxyCODONE-acetaminophen (PERCOCET) 5-325 MG per tablet Take 1-2 tablets by mouth every 4 (four) hours as needed. 03/10/14   Malvin Johns, MD   BP 145/68  Pulse 73  Temp(Src) 97.5 F (36.4 C) (Oral)  Resp 13  Ht 5\' 2"  (1.575 m)  Wt 195 lb (88.451 kg)  BMI 35.66 kg/m2  SpO2 92% Physical Exam  Constitutional: She is oriented to person, place, and time. She appears well-developed and well-nourished.  HENT:  Head: Normocephalic and atraumatic.  Eyes: Pupils are equal, round, and reactive to light.  Neck: Normal range of motion. Neck supple.  Cardiovascular: Normal rate, regular rhythm and normal heart sounds.   Pulmonary/Chest: Effort normal and breath sounds normal. No respiratory distress. She has no wheezes. She has no rales. She exhibits no tenderness.  Abdominal: Soft. Bowel sounds are normal. There is no tenderness. There is no rebound and no guarding.  Musculoskeletal: Normal range of motion. She exhibits no edema.  Some pain on palpation of the biceps, no pain over biceps tendon.  No bony tenderness to arm.  No reproducible pain to chest or upper back.  No reproducible pain on ROM of left arm.  No pain to cervical area.  Radial pulses intact.  No swelling or discoloration to arm.  Normal sensation to hand.  Lymphadenopathy:    She has no cervical adenopathy.  Neurological: She is alert and oriented to person, place, and time.  Skin: Skin is warm and dry. No rash noted.   Psychiatric: She has a normal mood and affect.    ED Course  Procedures (including critical care time) Labs Review Labs Reviewed  BASIC METABOLIC PANEL - Abnormal; Notable for the following:    Sodium 136 (*)    Glucose, Bld 102 (*)    GFR calc non Af Amer 65 (*)    GFR calc Af Amer 75 (*)    All other components within normal limits  CBC WITH DIFFERENTIAL  TROPONIN I  CK  I-STAT TROPOININ, ED    Imaging Review Dg Chest 2 View  03/10/2014   CLINICAL DATA:  Six  day history of left-sided neck shoulder and arm pain without known injury  EXAM: CHEST  2 VIEW  COMPARISON:  None.  FINDINGS: The lungs are hypoinflated. There is no focal infiltrate. The heart and pulmonary vascularity are normal. There is no pleural effusion or pneumothorax. The trachea is midline. There is gentle mid thoracic dextro curvature. The observed portions of the left shoulder are also appear normal.  IMPRESSION: There is bilateral pulmonary hypo inflation. There is no evidence of acute cardiopulmonary abnormality.   Electronically Signed   By: David  Martinique   On: 03/10/2014 17:29   Dg Cervical Spine Complete  03/10/2014   CLINICAL DATA:  Six days of left-sided neck and shoulder and arm pain  EXAM: CERVICAL SPINE  4+ VIEWS  COMPARISON:  None.  FINDINGS: The cervical vertebral bodies are preserved in height. The intervertebral disc space heights are well maintained. The prevertebral soft tissue spaces are normal. The posterior elements are intact. The oblique views reveal no high-grade bony encroachment upon the neural foramina. The odontoid is intact.  IMPRESSION: There is no acute bony abnormality of the cervical spine. No significant degenerative changes demonstrated either.   Electronically Signed   By: David  Martinique   On: 03/10/2014 17:30     EKG Interpretation   Date/Time:  Saturday March 10 2014 19:39:56 EDT Ventricular Rate:  75 PR Interval:  145 QRS Duration: 90 QT Interval:  424 QTC Calculation:  474 R Axis:   66 Text Interpretation:  Normal sinus rhythm Borderline T wave abnormalities  Confirmed by Lavell Supple  MD, Ahmadou Bolz ([id number]) on 03/10/2014 7:45:03 PM      MDM   Final diagnoses:  Pain of left upper extremity    Pt with pain to left upper arm and chest/upper back.  The way she describes it sounds musculoskeletal, but I cannot reproduce that pain entirely.  She doesn't have any associated symptoms that sound cardiac.  Delta troponins negative.  No abnormal findings on CXR.  No symptoms suggestive of PE.  No findings consistent with DVT or ischemia of arm.  I tried NTG, but she go no improvement with that.  Her pain was improved after dilaudid.  I will rx pain meds, but encouraged close f/u with her PMD.  May need a cervical MRI.  Advised pt to return here if her symptoms worsen.    Malvin Johns, MD 03/11/14 [phone]

## 2014-03-12 ENCOUNTER — Encounter: Payer: Self-pay | Admitting: Internal Medicine

## 2014-03-12 ENCOUNTER — Ambulatory Visit
Admission: RE | Admit: 2014-03-12 | Discharge: 2014-03-12 | Disposition: A | Discharge status: Home / Self Care | Payer: BC Managed Care – PPO | Source: Ambulatory Visit | Attending: Internal Medicine | Admitting: Internal Medicine

## 2014-03-12 ENCOUNTER — Ambulatory Visit (INDEPENDENT_AMBULATORY_CARE_PROVIDER_SITE_OTHER): Payer: BC Managed Care – PPO | Admitting: Internal Medicine

## 2014-03-12 VITALS — BP 160/100 | HR 71 | Temp 97.5°F | Ht 62.0 in | Wt 189.5 lb

## 2014-03-12 DIAGNOSIS — M5412 Radiculopathy, cervical region: Secondary | ICD-10-CM

## 2014-03-12 DIAGNOSIS — M5020 Other cervical disc displacement, unspecified cervical region: Secondary | ICD-10-CM

## 2014-03-12 MED ORDER — PREDNISONE (PAK) 10 MG PO TABS: ORAL_TABLET | ORAL | Status: DC

## 2014-03-12 NOTE — Progress Notes (Signed)
Pre visit review using our clinic review tool, if applicable. No additional management support is needed unless otherwise documented below in the visit note. 

## 2014-03-12 NOTE — Patient Instructions (Addendum)
It was good to see you today.  We have reviewed your prior records including labs and tests today  Test(s) ordered today (MRI neck). Your results will be called to you and released to MyChart  Medications reviewed and updated Start Prednisone pak taper x 6 days while awaiting MRI results and referral to specialist - ok to take prednisone with Percocet and muscle relaxer as from your other providers - No other changes recommended at this time.  Your prescription(s) have been submitted to your pharmacy. Please take as directed and contact our office if you believe you are having problem(s) with the medication(s).  we'll make referral to neck specialist after we have information from your MRI results . Our office will contact you regarding appointment(s) once made.  Cervical Radiculopathy Cervical radiculopathy happens when a nerve in the neck is pinched or bruised by a slipped (herniated) disk or by arthritic changes in the bones of the cervical spine. This can occur due to an injury or as part of the normal aging process. Pressure on the cervical nerves can cause pain or numbness that runs from your neck all the way down into your arm and fingers. CAUSES  There are many possible causes, including:  Injury.  Muscle tightness in the neck from overuse.  Swollen, painful joints (arthritis).  Breakdown or degeneration in the bones and joints of the spine (spondylosis) due to aging.  Bone spurs that may develop near the cervical nerves. SYMPTOMS  Symptoms include pain, weakness, or numbness in the affected arm and hand. Pain can be severe or irritating. Symptoms may be worse when extending or turning the neck. DIAGNOSIS  Your caregiver will ask about your symptoms and do a physical exam. He or she may test your strength and reflexes. X-rays, CT scans, and MRI scans may be needed in cases of injury or if the symptoms do not go away after a period of time. Electromyography (EMG) or nerve  conduction testing may be done to study how your nerves and muscles are working. TREATMENT  Your caregiver may recommend certain exercises to help relieve your symptoms. Cervical radiculopathy can, and often does, get better with time and treatment. If your problems continue, treatment options may include:  Wearing a soft collar for short periods of time.  Physical therapy to strengthen the neck muscles.  Medicines, such as nonsteroidal anti-inflammatory drugs (NSAIDs), oral corticosteroids, or spinal injections.  Surgery. Different types of surgery may be done depending on the cause of your problems. HOME CARE INSTRUCTIONS   Put ice on the affected area.  Put ice in a plastic bag.  Place a towel between your skin and the bag.  Leave the ice on for 15-20 minutes, 03-04 times a day or as directed by your caregiver.  If ice does not help, you can try using heat. Take a warm shower or bath, or use a hot water bottle as directed by your caregiver.  You may try a gentle neck and shoulder massage.  Use a flat pillow when you sleep.  Only take over-the-counter or prescription medicines for pain, discomfort, or fever as directed by your caregiver.  If physical therapy was prescribed, follow your caregiver's directions.  If a soft collar was prescribed, use it as directed. SEEK IMMEDIATE MEDICAL CARE IF:   Your pain gets much worse and cannot be controlled with medicines.  You have weakness or numbness in your hand, arm, face, or leg.  You have a high fever or a stiff, rigid  neck.  You lose bowel or bladder control (incontinence).  You have trouble with walking, balance, or speaking. MAKE SURE YOU:   Understand these instructions.  Will watch your condition.  Will get help right away if you are not doing well or get worse. Document Released: 03/03/2001 Document Revised: 08/31/2011 Document Reviewed: 01/20/2011 [facility] Patient Information 2015 [place], [place]. This  information is not intended to replace advice given to you by your health care provider. Make sure you discuss any questions you have with your health care provider.

## 2014-03-12 NOTE — Addendum Note (Signed)
Addended by: Gwendolyn Grant A on: 03/12/2014 05:10 PM   Modules accepted: Orders

## 2014-03-12 NOTE — Progress Notes (Signed)
Subjective:    Patient ID: Terri Hurley, female    DOB: 12/17/1960, 53 y.o.   MRN: [id number]  HPI  Patient is here for follow up - routine follow up and L arm pain - ED eval for same 9/19 reviewed and UC eval 01/2014 for same  Reports ongoing >4 weeks - initially improved after 01/2014 UC eval and NSIADs/muscle relaxer, then recurrent and worse in past 1 week  Pain currently 8/10 and located in L deltoid/tricep region Pain improved with holding arm over head on left side  Also reviewed chronic medical issues and interval medical events  Past Medical History  Diagnosis Date  . Arthritis     low back, hip, hands  . Depression     Sees [name], NP @ [facility]  counseling center.Marland Kitchen Has h/o hospitalization  . GERD (gastroesophageal reflux disease)     controlled with PPI therapy  . Fatty liver disease, nonalcoholic     clinical diagnosis by endocrinologist  . High cholesterol     No medical therapy. Last lab March '12  LDL 91, T. Chol 170. Minimal elevation in '08  . Vitamin D deficiency     lab '09  Vit D = 19  . Urinary incontinence   . HNP (herniated nucleus pulposus), cervical     C6-7. Has had PT, no surgery  . Hepatitis   . Plantar fasciitis   . Recurrent genital HSV (herpes simplex virus) infection     on daily suppression with acyclovir    Review of Systems  Constitutional: Positive for fatigue. Negative for fever and unexpected weight change.  Respiratory: Negative for cough and shortness of breath.   Cardiovascular: Negative for chest pain and leg swelling.  Musculoskeletal: Positive for back pain (chronic) and neck pain. Negative for arthralgias and gait problem.       Objective:   Physical Exam  BP 160/100  Pulse 71  Temp(Src) 97.5 F (36.4 C) (Oral)  Ht 5\' 2"  (1.575 m)  Wt 189 lb 8 oz (85.957 kg)  BMI 34.65 kg/m2  SpO2 97% Wt Readings from Last 3 Encounters:  03/12/14 189 lb 8 oz (85.957 kg)  03/10/14 195 lb (88.451 kg)  01/23/14 196 lb  (88.905 kg)   Constitutional: She appears uncomfortable, colicky from discomfort - otherwise well-developed and well-nourished. Neck: Normal range of motion. Neck supple. No JVD present. No thyromegaly present.  Cardiovascular: Normal rate, regular rhythm and normal heart sounds.  No murmur heard. No BLE edema. Pulmonary/Chest: Effort normal and breath sounds normal. No respiratory distress. She has no wheezes.  MSkel: 4+/5 strength in L tricep - RTC intact - good equal hand grip Psychiatric: She has a normal mood and affect. Her behavior is normal. Judgment and thought content normal.   Lab Results  Component Value Date   WBC 5.9 03/10/2014   HGB 14.8 03/10/2014   HCT 42.4 03/10/2014   PLT 302 03/10/2014   GLUCOSE 102* 03/10/2014   CHOL 201* 12/01/2013   TRIG 86.0 12/01/2013   HDL 60.50 12/01/2013   LDLDIRECT 130.7 11/21/2012   LDLCALC 123* 12/01/2013   ALT 37* 03/06/2014   AST 25 03/06/2014   NA 136* 03/10/2014   K 4.7 03/10/2014   CL 99 03/10/2014   CREATININE 0.97 03/10/2014   BUN 13 03/10/2014   CO2 23 03/10/2014   TSH 1.19 12/01/2013   HGBA1C 5.5 11/18/2011    Dg Chest 2 View  03/10/2014   CLINICAL DATA:  Six day history  of left-sided neck shoulder and arm pain without known injury  EXAM: CHEST  2 VIEW  COMPARISON:  None.  FINDINGS: The lungs are hypoinflated. There is no focal infiltrate. The heart and pulmonary vascularity are normal. There is no pleural effusion or pneumothorax. The trachea is midline. There is gentle mid thoracic dextro curvature. The observed portions of the left shoulder are also appear normal.  IMPRESSION: There is bilateral pulmonary hypo inflation. There is no evidence of acute cardiopulmonary abnormality.   Electronically Signed   By: David  Martinique   On: 03/10/2014 17:29   Dg Cervical Spine Complete  03/10/2014   CLINICAL DATA:  Six days of left-sided neck and shoulder and arm pain  EXAM: CERVICAL SPINE  4+ VIEWS  COMPARISON:  None.  FINDINGS: The cervical vertebral  bodies are preserved in height. The intervertebral disc space heights are well maintained. The prevertebral soft tissue spaces are normal. The posterior elements are intact. The oblique views reveal no high-grade bony encroachment upon the neural foramina. The odontoid is intact.  IMPRESSION: There is no acute bony abnormality of the cervical spine. No significant degenerative changes demonstrated either.   Electronically Signed   By: David  Martinique   On: 03/10/2014 17:30       Assessment & Plan:   Left side cervical radiculopathy- pain ongoing >4 weeks, initially improved with NSAIDs and muscle relaxers - now worse despite conservative care in past 7 days  Plain cervical film unremarkable but hx prior C6-7 NP, improved with ESI +PT in [place] >5 years ago, no prior surgery  Refer of MRI c-spine and start pred pak Continue muscle relaxers and Pecocet prn fro ED 9/19 Plan refer to Nsurg/ortho spine depending on MRI results  Problem List Items Addressed This Visit   HNP (herniated nucleus pulposus), cervical   Relevant Orders      MR Cervical Spine Wo Contrast    Other Visit Diagnoses   Left cervical radiculopathy    -  Primary    Relevant Orders       MR Cervical Spine Wo Contrast

## 2014-03-14 ENCOUNTER — Telehealth: Payer: Self-pay

## 2014-03-14 NOTE — Telephone Encounter (Signed)
Pt stated that [facility] called her and she was wondering if there was another location or a sooner appointment.   Pt is also requesting more pain medication.   Please Advise.Marland Kitchen

## 2014-03-14 NOTE — Telephone Encounter (Signed)
Pt informed that message was given to MD and that she will be called as soon as advise it given.

## 2014-03-15 MED ORDER — OXYCODONE-ACETAMINOPHEN 5-325 MG PO TABS: 1.0000 | ORAL_TABLET | ORAL | Status: DC | PRN

## 2014-03-15 NOTE — Telephone Encounter (Signed)
?  when is pt scheduled and with who Ok for pain med refill

## 2014-03-15 NOTE — Telephone Encounter (Signed)
Pt informed that Rx ready for pick up here at the office

## 2014-04-06 ENCOUNTER — Other Ambulatory Visit: Payer: Self-pay

## 2014-05-22 HISTORY — PX: SPINE SURGERY: SHX786

## 2014-07-04 ENCOUNTER — Emergency Department (HOSPITAL_COMMUNITY): Payer: BLUE CROSS/BLUE SHIELD

## 2014-07-04 ENCOUNTER — Ambulatory Visit (INDEPENDENT_AMBULATORY_CARE_PROVIDER_SITE_OTHER): Payer: BLUE CROSS/BLUE SHIELD | Admitting: Emergency Medicine

## 2014-07-04 ENCOUNTER — Encounter (HOSPITAL_COMMUNITY): Payer: Self-pay | Admitting: Emergency Medicine

## 2014-07-04 ENCOUNTER — Emergency Department (HOSPITAL_COMMUNITY)
Admission: EM | Admit: 2014-07-04 | Discharge: 2014-07-04 | Disposition: A | Discharge status: Home / Self Care | Payer: BLUE CROSS/BLUE SHIELD | Attending: Emergency Medicine | Admitting: Emergency Medicine

## 2014-07-04 VITALS — BP 126/82 | HR 111 | Temp 100.3°F | Resp 17 | Ht 61.5 in | Wt 193.0 lb

## 2014-07-04 DIAGNOSIS — F329 Major depressive disorder, single episode, unspecified: Secondary | ICD-10-CM | POA: Insufficient documentation

## 2014-07-04 DIAGNOSIS — Z8639 Personal history of other endocrine, nutritional and metabolic disease: Secondary | ICD-10-CM | POA: Diagnosis not present

## 2014-07-04 DIAGNOSIS — Z79899 Other long term (current) drug therapy: Secondary | ICD-10-CM | POA: Insufficient documentation

## 2014-07-04 DIAGNOSIS — R55 Syncope and collapse: Secondary | ICD-10-CM | POA: Diagnosis not present

## 2014-07-04 DIAGNOSIS — K219 Gastro-esophageal reflux disease without esophagitis: Secondary | ICD-10-CM | POA: Diagnosis not present

## 2014-07-04 DIAGNOSIS — R509 Fever, unspecified: Secondary | ICD-10-CM | POA: Insufficient documentation

## 2014-07-04 DIAGNOSIS — Z8739 Personal history of other diseases of the musculoskeletal system and connective tissue: Secondary | ICD-10-CM | POA: Insufficient documentation

## 2014-07-04 DIAGNOSIS — Z8619 Personal history of other infectious and parasitic diseases: Secondary | ICD-10-CM | POA: Diagnosis not present

## 2014-07-04 DIAGNOSIS — R000 Tachycardia, unspecified: Secondary | ICD-10-CM

## 2014-07-04 DIAGNOSIS — Z880 Allergy status to penicillin: Secondary | ICD-10-CM | POA: Insufficient documentation

## 2014-07-04 DIAGNOSIS — R21 Rash and other nonspecific skin eruption: Secondary | ICD-10-CM

## 2014-07-04 LAB — COMPREHENSIVE METABOLIC PANEL
ALT: 50 U/L — ABNORMAL HIGH (ref 0–35)
AST: 37 U/L (ref 0–37)
Albumin: 3.4 g/dL — ABNORMAL LOW (ref 3.5–5.2)
Alkaline Phosphatase: 72 U/L (ref 39–117)
Anion gap: 8 (ref 5–15)
BUN: 6 mg/dL (ref 6–23)
CO2: 23 mmol/L (ref 19–32)
Calcium: 8 mg/dL — ABNORMAL LOW (ref 8.4–10.5)
Chloride: 102 mEq/L (ref 96–112)
Creatinine, Ser: 0.91 mg/dL (ref 0.50–1.10)
GFR calc Af Amer: 81 mL/min — ABNORMAL LOW (ref >90)
GFR calc non Af Amer: 70 mL/min — ABNORMAL LOW (ref >90)
Glucose, Bld: 83 mg/dL (ref 70–99)
Potassium: 3.6 mmol/L (ref 3.5–5.1)
Sodium: 133 mmol/L — ABNORMAL LOW (ref 135–145)
Total Bilirubin: 0.8 mg/dL (ref 0.3–1.2)
Total Protein: 6.4 g/dL (ref 6.0–8.3)

## 2014-07-04 LAB — POCT CBC
Granulocyte percent: 87.3 %G — AB (ref 37–80)
HCT, POC: 40.4 % (ref 37.7–47.9)
Hemoglobin: 13.6 g/dL (ref 12.2–16.2)
Lymph, poc: 0.6 (ref 0.6–3.4)
MCH, POC: 30.9 pg (ref 27–31.2)
MCHC: 33.7 g/dL (ref 31.8–35.4)
MCV: 91.7 fL (ref 80–97)
MID (cbc): 0.2 (ref 0–0.9)
MPV: 8.5 fL (ref 0–99.8)
POC Granulocyte: 5.4 (ref 2–6.9)
POC LYMPH PERCENT: 9.7 %L — AB (ref 10–50)
POC MID %: 3 %M (ref 0–12)
Platelet Count, POC: 140 10*3/uL — AB (ref 142–424)
RBC: 4.4 M/uL (ref 4.04–5.48)
RDW, POC: 13.6 %
WBC: 6.2 10*3/uL (ref 4.6–10.2)

## 2014-07-04 LAB — CBC WITH DIFFERENTIAL/PLATELET
Basophils Absolute: 0 10*3/uL (ref 0.0–0.1)
Basophils Relative: 0 % (ref 0–1)
Eosinophils Absolute: 0 10*3/uL (ref 0.0–0.7)
Eosinophils Relative: 0 % (ref 0–5)
HCT: 36.9 % (ref 36.0–46.0)
Hemoglobin: 12.5 g/dL (ref 12.0–15.0)
Lymphocytes Relative: 15 % (ref 12–46)
Lymphs Abs: 0.7 10*3/uL (ref 0.7–4.0)
MCH: 30.3 pg (ref 26.0–34.0)
MCHC: 33.9 g/dL (ref 30.0–36.0)
MCV: 89.6 fL (ref 78.0–100.0)
Monocytes Absolute: 0.4 10*3/uL (ref 0.1–1.0)
Monocytes Relative: 9 % (ref 3–12)
Neutro Abs: 3.4 10*3/uL (ref 1.7–7.7)
Neutrophils Relative %: 76 % (ref 43–77)
Platelets: 134 10*3/uL — ABNORMAL LOW (ref 150–400)
RBC: 4.12 MIL/uL (ref 3.87–5.11)
RDW: 13.4 % (ref 11.5–15.5)
WBC: 4.5 10*3/uL (ref 4.0–10.5)

## 2014-07-04 LAB — URINALYSIS, ROUTINE W REFLEX MICROSCOPIC
Bilirubin Urine: NEGATIVE
Glucose, UA: NEGATIVE mg/dL
Hgb urine dipstick: NEGATIVE
Ketones, ur: NEGATIVE mg/dL
Leukocytes, UA: NEGATIVE
Nitrite: NEGATIVE
Protein, ur: NEGATIVE mg/dL
Specific Gravity, Urine: 1.01 (ref 1.005–1.030)
Urobilinogen, UA: 0.2 mg/dL (ref 0.0–1.0)
pH: 7 (ref 5.0–8.0)

## 2014-07-04 LAB — I-STAT CHEM 8, ED
BUN: 7 mg/dL (ref 6–23)
Calcium, Ion: 0.91 mmol/L — ABNORMAL LOW (ref 1.12–1.23)
Chloride: 100 mEq/L (ref 96–112)
Creatinine, Ser: 0.8 mg/dL (ref 0.50–1.10)
Glucose, Bld: 120 mg/dL — ABNORMAL HIGH (ref 70–99)
HCT: 41 % (ref 36.0–46.0)
Hemoglobin: 13.9 g/dL (ref 12.0–15.0)
Potassium: 4.5 mmol/L (ref 3.5–5.1)
Sodium: 132 mmol/L — ABNORMAL LOW (ref 135–145)
TCO2: 22 mmol/L (ref 0–100)

## 2014-07-04 LAB — RAPID STREP SCREEN (MED CTR MEBANE ONLY): Streptococcus, Group A Screen (Direct): NEGATIVE

## 2014-07-04 LAB — LIPASE, BLOOD: Lipase: 26 U/L (ref 11–59)

## 2014-07-04 LAB — I-STAT CG4 LACTIC ACID, ED: Lactic Acid, Venous: 0.79 mmol/L (ref 0.5–2.2)

## 2014-07-04 LAB — GLUCOSE, POCT (MANUAL RESULT ENTRY): POC Glucose: 151 mg/dl — AB (ref 70–99)

## 2014-07-04 MED ORDER — SODIUM CHLORIDE 0.9 % IV BOLUS (SEPSIS)
500.0000 mL | Freq: Once | INTRAVENOUS | Status: AC
  Administered 2014-07-04: 500 mL via INTRAVENOUS

## 2014-07-04 MED ORDER — IBUPROFEN 200 MG PO TABS
600.0000 mg | ORAL_TABLET | Freq: Once | ORAL | Status: AC
  Administered 2014-07-04: 600 mg via ORAL
  Filled 2014-07-04: qty 3

## 2014-07-04 MED ORDER — SODIUM CHLORIDE 0.9 % IV SOLN: INTRAVENOUS | Status: DC

## 2014-07-04 MED ORDER — ONDANSETRON HCL 4 MG/2ML IJ SOLN
4.0000 mg | Freq: Once | INTRAMUSCULAR | Status: AC
  Administered 2014-07-04: 4 mg via INTRAVENOUS
  Filled 2014-07-04: qty 2

## 2014-07-04 NOTE — ED Notes (Signed)
Called labs inquiring about cbc, reports they do not have tube.  Spoke with lab and lab was sending purple top up.

## 2014-07-04 NOTE — ED Notes (Signed)
Per EMS, pt comes from [facility] with c/o generalized weakness, dizziness, lightheadness and a rash all of over body since Saturday. Pt also has c/o intermittent mid chest pain. Pt A&OX4, NAD noted. Pt had a temp of 100.3. Pt had neck surgery Oct 28th. Pt has soft neck collar on. VSS. Sinus tach on 12 lead.  22g IV placed in right hand.
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
## 2014-07-04 NOTE — ED Provider Notes (Signed)
3:30 PM Complains of lightheadedness onset 3 days ago patient states she had near syncopal event 2 days ago when she stood up. She denies pain anywhere. She complains of diffuse rash which started approximately 3 days ago which is unchanged. She is uncertain where rash started. She also has a rash at left chest which has been there for approximately 5 weeks she denies cough denies urinary symptoms denies headache. She does have chronic neck pain since cervical fusion October 2015 at [facility]. On exam patient is alert nontoxic HEENT exam is members moist pink nonicteric no mucosal lesion neck supple lungs clear auscultation heart regular rate and rhythm abdomen mildly obese nontender extremities without edema skin there is a fine maculopapular rash diffusely on trunk and  Extremities.  7 PM rash has not changed since in my presence. Patient is not lightheaded on standing. She is alert and ambulatory and feels ready to go home she is alert and nontoxic appearing Plan blood cultures pending. Tylenol for discomfort or for fever. Follow-up with Dr.Lescber tomorrow Results for orders placed or performed during the hospital encounter of 07/04/14  Rapid strep screen  Result Value Ref Range   Streptococcus, Group A Screen (Direct) NEGATIVE NEGATIVE  Lipase, blood  Result Value Ref Range   Lipase 26 11 - 59 U/L  Comprehensive metabolic panel  Result Value Ref Range   Sodium 133 (L) 135 - 145 mmol/L   Potassium 3.6 3.5 - 5.1 mmol/L   Chloride 102 96 - 112 mEq/L   CO2 23 19 - 32 mmol/L   Glucose, Bld 83 70 - 99 mg/dL   BUN 6 6 - 23 mg/dL   Creatinine, Ser 0.91 0.50 - 1.10 mg/dL   Calcium 8.0 (L) 8.4 - 10.5 mg/dL   Total Protein 6.4 6.0 - 8.3 g/dL   Albumin 3.4 (L) 3.5 - 5.2 g/dL   AST 37 0 - 37 U/L   ALT 50 (H) 0 - 35 U/L   Alkaline Phosphatase 72 39 - 117 U/L   Total Bilirubin 0.8 0.3 - 1.2 mg/dL   GFR calc non Af Amer 70 (L) >90 mL/min   GFR calc Af Amer 81 (L) >90 mL/min   Anion gap 8 5 - 15  Urinalysis, Routine w reflex microscopic  Result Value Ref Range   Color, Urine YELLOW YELLOW   APPearance CLEAR CLEAR   Specific Gravity, Urine 1.010 1.005 - 1.030   pH 7.0 5.0 - 8.0   Glucose, UA NEGATIVE NEGATIVE mg/dL   Hgb urine dipstick NEGATIVE NEGATIVE   Bilirubin Urine NEGATIVE NEGATIVE   Ketones, ur NEGATIVE NEGATIVE mg/dL   Protein, ur NEGATIVE NEGATIVE mg/dL   Urobilinogen, UA 0.2 0.0 - 1.0 mg/dL   Nitrite NEGATIVE NEGATIVE   Leukocytes, UA NEGATIVE NEGATIVE  CBC with Differential  Result Value Ref Range   WBC 4.5 4.0 - 10.5 K/uL   RBC 4.12 3.87 - 5.11 MIL/uL   Hemoglobin 12.5 12.0 - 15.0 g/dL   HCT 36.9 36.0 - 46.0 %   MCV 89.6 78.0 - 100.0 fL   MCH 30.3 26.0 - 34.0 pg   MCHC 33.9 30.0 - 36.0 g/dL   RDW 13.4 11.5 - 15.5 %   Platelets 134 (L) 150 - 400 K/uL   Neutrophils Relative % 76 43 - 77 %   Neutro Abs 3.4 1.7 - 7.7 K/uL   Lymphocytes Relative 15 12 - 46 %   Lymphs Abs 0.7 0.7 - 4.0 K/uL   Monocytes  Relative 9 3 - 12 %   Monocytes Absolute 0.4 0.1 - 1.0 K/uL   Eosinophils Relative 0 0 - 5 %   Eosinophils Absolute 0.0 0.0 - 0.7 K/uL   Basophils Relative 0 0 - 1 %   Basophils Absolute 0.0 0.0 - 0.1 K/uL  I-Stat Chem 8, ED  Result Value Ref Range   Sodium 132 (L) 135 - 145 mmol/L   Potassium 4.5 3.5 - 5.1 mmol/L   Chloride 100 96 - 112 mEq/L   BUN 7 6 - 23 mg/dL   Creatinine, Ser 0.80 0.50 - 1.10 mg/dL   Glucose, Bld 120 (H) 70 - 99 mg/dL   Calcium, Ion 0.91 (L) 1.12 - 1.23 mmol/L   TCO2 22 0 - 100 mmol/L   Hemoglobin 13.9 12.0 - 15.0 g/dL   HCT 41.0 36.0 - 46.0 %  I-Stat CG4 Lactic Acid, ED  Result Value Ref Range   Lactic Acid, Venous 0.79 0.5 - 2.2 mmol/L   Dg Chest 2 View  07/04/2014   CLINICAL DATA:  Nausea, vomiting, fever, rash on trunk for 4-5 weeks. Mid chest pain for several days.  EXAM: CHEST  2 VIEW  COMPARISON:  03/10/2014  FINDINGS: Slight elevation of the right hemidiaphragm, stable. No confluent opacities or  effusions. Heart is normal size. No acute bony abnormality.  IMPRESSION: No active cardiopulmonary disease.   Electronically Signed   By: Rolm Baptise M.D.   On: 07/04/2014 14:33    Diagnosis febrile illness    Orlie Dakin, MD 07/04/14 1909

## 2014-07-04 NOTE — Discharge Instructions (Signed)
Fever, Adult Take Tylenol as directed every 4 hours for temperature higher than 100.4 while awake. Call Dr.Leschber tomorrow to schedule office appointment to arrange to be seen this week. If you're unable to see Dr.Leschber this week, return here so that we may reevaluate you. Return if you feel worse for any reason A fever is a temperature of 100.4 F (38 C) or above.  HOME CARE  Take fever medicine as told by your doctor. Do not  take aspirin for fever if you are younger than 55 years of age.  If you are given antibiotic medicine, take it as told. Finish the medicine even if you start to feel better.  Rest.  Drink enough fluids to keep your pee (urine) clear or pale yellow. Do not drink alcohol.  Take a bath or shower with room temperature water. Do not use ice water or alcohol sponge baths.  Wear lightweight, loose clothes. GET HELP RIGHT AWAY IF:   You are short of breath or have trouble breathing.  You are very weak.  You are dizzy or you pass out (faint).  You are very thirsty or are making little or no urine.  You have new pain.  You throw up (vomit) or have watery poop (diarrhea).  You keep throwing up or having watery poop for more than 1 to 2 days.  You have a stiff neck or light bothers your eyes.  You have a skin rash.  You have a fever or problems (symptoms) that last for more than 2 to 3 days.  You have a fever and your problems quickly get worse.  You keep throwing up the fluids you drink.  You do not feel better after 3 days.  You have new problems. MAKE SURE YOU:   Understand these instructions.  Will watch your condition.  Will get help right away if you are not doing well or get worse. Document Released: 03/17/2008 Document Revised: 08/31/2011 Document Reviewed: 04/09/2011 [facility] Patient Information 2015 [place], [place]. This information is not intended to replace advice given to you by your health care provider. Make sure you discuss any  questions you have with your health care provider.

## 2014-07-04 NOTE — ED Notes (Signed)
Pt made aware to return if symptoms worsen or if any life threatening symptoms occur.   

## 2014-07-04 NOTE — ED Notes (Signed)
Pt reports that she has had no weakness today.

## 2014-07-04 NOTE — ED Provider Notes (Signed)
CSN: [id number]     Arrival date & time 07/04/14  1205 History   First MD Initiated Contact with Patient 07/04/14 1217     Chief Complaint  Patient presents with  . Near Syncope     (Consider location/radiation/quality/duration/timing/severity/associated sxs/prior Treatment) Patient is a 54 y.o. female presenting with near-syncope. The history is provided by the patient and the EMS personnel.  Near Syncope Associated symptoms include chest pain. Pertinent negatives include no abdominal pain, no headaches and no shortness of breath.   patient sent here from urgent care. Brought in by EMS. Patient with complaint of generalized weakness dizziness lightheadedness and rash all over body since Saturday. The rash does not itch. Also complaint of intermittent mid chest pain but it is very brief just seconds. Nothing lasting any significant period of time. Fever there of 100.3. Patient had neck surgery back in October 28 which is healing fine patient has a soft collar in place for that.  Past Medical History  Diagnosis Date  . Arthritis     low back, hip, hands  . Depression     Sees [name], NP @ [facility]  counseling center.Marland Kitchen Has h/o hospitalization  . GERD (gastroesophageal reflux disease)     controlled with PPI therapy  . Fatty liver disease, nonalcoholic     clinical diagnosis by endocrinologist  . High cholesterol     No medical therapy. Last lab March '12  LDL 91, T. Chol 170. Minimal elevation in '08  . Vitamin D deficiency     lab '09  Vit D = 19  . Urinary incontinence   . HNP (herniated nucleus pulposus), cervical     C6-7. Has had PT, no surgery  . Hepatitis   . Plantar fasciitis   . Recurrent genital HSV (herpes simplex virus) infection     on daily suppression with acyclovir   Past Surgical History  Procedure Laterality Date  . Incontinence surgery  2009  . Cholecystectomy    . Total abdominal hysterectomy w/ bilateral salpingoophorectomy  2009    with repair  of cystocele and rectocele    Family History  Problem Relation Age of Onset  . Heart disease Mother   . Diabetes Mother     peripheral neuropathy  . Hypertension Mother   . Hyperlipidemia Mother   . Diabetes Father   . Heart disease Brother     arrythmia, DCC  . Cancer Sister     breast cancer  . Asthma Sister   . Thyroid disease Sister   . COPD Sister   . COPD Brother   . Stroke Brother    History  Substance Use Topics  . Smoking status: Never Smoker   . Smokeless tobacco: Never Used  . Alcohol Use: No   OB History    No data available     Review of Systems  Constitutional: Positive for fever.  HENT: Negative for congestion.   Respiratory: Negative for shortness of breath.   Cardiovascular: Positive for chest pain and near-syncope.  Gastrointestinal: Negative for nausea, vomiting and abdominal pain.  Genitourinary: Negative for dysuria and hematuria.  Musculoskeletal: Negative for myalgias.  Skin: Positive for rash.  Neurological: Positive for dizziness and light-headedness. Negative for headaches.  Hematological: Does not bruise/bleed easily.  Psychiatric/Behavioral: Negative for confusion.      Allergies  Penicillins  Home Medications   Prior to Admission medications   Medication Sig Start Date End Date Taking? Authorizing Provider  acyclovir (ZOVIRAX) 400 MG tablet Take  1 tablet (400 mg total) by mouth daily. 12/01/13  Yes Rowe Clack, MD  buPROPion (WELLBUTRIN XL) 300 MG 24 hr tablet Take 1 tablet (300 mg total) by mouth daily. 12/01/13  Yes Rowe Clack, MD  cyclobenzaprine (FLEXERIL) 10 MG tablet Take 10 mg by mouth 3 (three) times daily as needed for muscle spasms.   Yes Historical Provider, MD  escitalopram (LEXAPRO) 10 MG tablet Take 1 tablet (10 mg total) by mouth daily. 12/01/13  Yes Rowe Clack, MD  omeprazole (PRILOSEC) 40 MG capsule Take 1 capsule (40 mg total) by mouth daily. 12/01/13  Yes Rowe Clack, MD   oxyCODONE-acetaminophen (PERCOCET) 5-325 MG per tablet Take 1 tablet by mouth every 4 (four) hours as needed for severe pain. 03/15/14  Yes Rowe Clack, MD  senna-docusate (SENOKOT-S) 8.6-50 MG per tablet Take 2 tablets by mouth 2 (two) times daily.    Yes Historical Provider, MD  methocarbamol (ROBAXIN) 500 MG tablet Take 1-2 tablets (500-1,000 mg total) by mouth at bedtime as needed for muscle spasms. Patient not taking: Reported on 07/04/2014 03/06/14   Wardell Honour, MD   BP 139/65 mmHg  Pulse 91  Temp(Src) 98.4 F (36.9 C) (Oral)  Resp 16  Ht 5\' 2"  (1.575 m)  Wt 192 lb (87.091 kg)  BMI 35.11 kg/m2  SpO2 99% Physical Exam  Constitutional: She is oriented to person, place, and time. She appears well-developed and well-nourished. No distress.  HENT:  Head: Normocephalic and atraumatic.  Mouth/Throat: Oropharynx is clear and moist.  Eyes: Conjunctivae and EOM are normal. Pupils are equal, round, and reactive to light.  Neck: Neck supple.  Soft collar on neck.  Cardiovascular: Normal rate, regular rhythm and normal heart sounds.   No murmur heard. Pulmonary/Chest: Effort normal and breath sounds normal. No respiratory distress.  Abdominal: Soft. Bowel sounds are normal. She exhibits no distension.  Musculoskeletal: Normal range of motion. She exhibits no edema.  Neurological: She is alert and oriented to person, place, and time. No cranial nerve deficit. She exhibits normal muscle tone. Coordination normal.  Skin: Skin is warm. Rash noted.  Patient with papular erythematous type rash some macular component to it particularly on the left anterior chest. No vesicles. It does blanch.  Nursing note and vitals reviewed.   ED Course  Procedures (including critical care time) Labs Review Labs Reviewed  I-STAT CHEM 8, ED - Abnormal; Notable for the following:    Sodium 132 (*)    Glucose, Bld 120 (*)    Calcium, Ion 0.91 (*)    All other components within normal limits   CULTURE, BLOOD (ROUTINE X 2)  CULTURE, BLOOD (ROUTINE X 2)  RAPID STREP SCREEN  URINALYSIS, ROUTINE W REFLEX MICROSCOPIC  CBC WITH DIFFERENTIAL  LIPASE, BLOOD  COMPREHENSIVE METABOLIC PANEL  CBC WITH DIFFERENTIAL  I-STAT CG4 LACTIC ACID, ED  I-STAT CG4 LACTIC ACID, ED   Results for orders placed or performed during the hospital encounter of 07/04/14  Urinalysis, Routine w reflex microscopic  Result Value Ref Range   Color, Urine YELLOW YELLOW   APPearance CLEAR CLEAR   Specific Gravity, Urine 1.010 1.005 - 1.030   pH 7.0 5.0 - 8.0   Glucose, UA NEGATIVE NEGATIVE mg/dL   Hgb urine dipstick NEGATIVE NEGATIVE   Bilirubin Urine NEGATIVE NEGATIVE   Ketones, ur NEGATIVE NEGATIVE mg/dL   Protein, ur NEGATIVE NEGATIVE mg/dL   Urobilinogen, UA 0.2 0.0 - 1.0 mg/dL   Nitrite NEGATIVE NEGATIVE  Leukocytes, UA NEGATIVE NEGATIVE  I-Stat Chem 8, ED  Result Value Ref Range   Sodium 132 (L) 135 - 145 mmol/L   Potassium 4.5 3.5 - 5.1 mmol/L   Chloride 100 96 - 112 mEq/L   BUN 7 6 - 23 mg/dL   Creatinine, Ser 0.80 0.50 - 1.10 mg/dL   Glucose, Bld 120 (H) 70 - 99 mg/dL   Calcium, Ion 0.91 (L) 1.12 - 1.23 mmol/L   TCO2 22 0 - 100 mmol/L   Hemoglobin 13.9 12.0 - 15.0 g/dL   HCT 41.0 36.0 - 46.0 %     Imaging Review Dg Chest 2 View  07/04/2014   CLINICAL DATA:  Nausea, vomiting, fever, rash on trunk for 4-5 weeks. Mid chest pain for several days.  EXAM: CHEST  2 VIEW  COMPARISON:  03/10/2014  FINDINGS: Slight elevation of the right hemidiaphragm, stable. No confluent opacities or effusions. Heart is normal size. No acute bony abnormality.  IMPRESSION: No active cardiopulmonary disease.   Electronically Signed   By: Rolm Baptise M.D.   On: 07/04/2014 14:33     EKG Interpretation   Date/Time:  Wednesday July 04 2014 12:16:31 EST Ventricular Rate:  110 PR Interval:  155 QRS Duration: 85 QT Interval:  370 QTC Calculation: 500 R Axis:   95 Text Interpretation:  Sinus tachycardia  Borderline right axis deviation  Borderline T abnormalities, anterior leads Borderline prolonged QT  interval Baseline wander in lead(s) V3 No significant change since last  tracing Confirmed by Kennethia Lynes  MD, Ace Bergfeld [phone]) on 07/04/2014 12:43:14 PM      MDM   Final diagnoses:  Fever  Near syncope  Rash    Patient with several labs still pending. Complete metabolic panels pending lactic acids pending rapid strep of the throat is pending. Patient improving overall here with fluids and intraoral of the fever. No longer tachycardic not hypotensive no significant vital sign abnormalities. Also fever now below 99. As stated patient overall feels better. The rash may be strep related that's why the rapid strep was done. At this point patient appears that she may be able to be discharged home. Pending the rest of the lab results.  No signs of urinary tract infection no blood in the urine. The i-STAT without any significant abnormalities. On the electrolytes. Chest x-rays negative for pneumonia pneumothorax or pulmonary edema. No evidence of pleural effusion.  Patient did not have any syncope but has been feeling lightheaded and dizzy patient feeling like she could've passed out. But she did not. Sent here from urgent care. Dizziness but no vertigo.    Fredia Sorrow, MD 07/04/14 1539

## 2014-07-05 ENCOUNTER — Telehealth: Payer: Self-pay

## 2014-07-05 NOTE — Telephone Encounter (Signed)
Pt called back. Scheduled appt confirmed.

## 2014-07-05 NOTE — Telephone Encounter (Signed)
LVM for pt to call back  RE: need to get her an appt here on Friday due to ER visit and PCP request.

## 2014-07-06 ENCOUNTER — Ambulatory Visit (INDEPENDENT_AMBULATORY_CARE_PROVIDER_SITE_OTHER): Payer: BLUE CROSS/BLUE SHIELD | Admitting: Internal Medicine

## 2014-07-06 ENCOUNTER — Other Ambulatory Visit (INDEPENDENT_AMBULATORY_CARE_PROVIDER_SITE_OTHER): Payer: BLUE CROSS/BLUE SHIELD

## 2014-07-06 ENCOUNTER — Encounter: Payer: Self-pay | Admitting: Internal Medicine

## 2014-07-06 VITALS — BP 124/88 | HR 94 | Temp 98.4°F | Resp 14 | Ht 62.0 in | Wt 195.2 lb

## 2014-07-06 DIAGNOSIS — R509 Fever, unspecified: Secondary | ICD-10-CM

## 2014-07-06 DIAGNOSIS — R21 Rash and other nonspecific skin eruption: Secondary | ICD-10-CM

## 2014-07-06 LAB — CBC
HCT: 39.3 % (ref 36.0–46.0)
Hemoglobin: 13.1 g/dL (ref 12.0–15.0)
MCHC: 33.4 g/dL (ref 30.0–36.0)
MCV: 89.7 fl (ref 78.0–100.0)
Platelets: 132 10*3/uL — ABNORMAL LOW (ref 150.0–400.0)
RBC: 4.38 Mil/uL (ref 3.87–5.11)
RDW: 13.7 % (ref 11.5–15.5)
WBC: 2.8 10*3/uL — ABNORMAL LOW (ref 4.0–10.5)

## 2014-07-06 LAB — CULTURE, GROUP A STREP

## 2014-07-06 LAB — SEDIMENTATION RATE: Sed Rate: 38 mm/hr — ABNORMAL HIGH (ref 0–22)

## 2014-07-06 MED ORDER — DIPHENHYDRAMINE HCL 25 MG PO TABS
25.0000 mg | ORAL_TABLET | Freq: Four times a day (QID) | ORAL | Status: DC | PRN
Start: 2014-07-06 — End: 2015-01-18

## 2014-07-06 NOTE — Progress Notes (Signed)
Pre visit review using our clinic review tool, if applicable. No additional management support is needed unless otherwise documented below in the visit note. 

## 2014-07-06 NOTE — Patient Instructions (Signed)
Several of your medicines can be associated with rash and with fevers. We will check some more blood work today to make sure we are not missing any problems.   We would like you to stop taking acyclovir, robaxin. It is safe to take the flexeril for the muscle pain.   We would like you to stop taking tylenol so we can see if you are still having fevers.   You can take benadryl for the itching. We have sent in a prescription.

## 2014-07-06 NOTE — Progress Notes (Signed)
   Subjective:    Patient ID: Terri Hurley, female    DOB: 06/09/1961, 54 y.o.   MRN: [id number]  HPI The patient is a 54 YO female who is coming in for an ER follow up. She was seen for fevers and rash on Wednesday. She still feels like she has been having fevers since then and has taken tylenol as needed. She has not taken it today and has not used her pain medicine in 2 days. She then later in the visit states that she has taken some tylenol this morning. She still has rash which only started itching mildly this morning. She doesn't think it has changed much. She denies neck pain or stiffness, chest pain, abdominal pain, nausea, vomiting, diarrhea. No other complaints or changes. No recent travel, outdoor exposure, denies pets other than cat or dog.   Review of Systems  Constitutional: Positive for fever and chills. Negative for activity change, appetite change, fatigue and unexpected weight change.  HENT: Negative.   Respiratory: Negative for cough, chest tightness, shortness of breath and wheezing.   Cardiovascular: Negative for chest pain, palpitations and leg swelling.  Gastrointestinal: Negative for nausea, vomiting, abdominal pain, diarrhea, constipation and abdominal distention.  Musculoskeletal: Negative for myalgias, back pain, arthralgias, neck pain and neck stiffness.  Skin: Positive for rash.  Neurological: Negative for dizziness, weakness, light-headedness and headaches.  Psychiatric/Behavioral: Negative.       Objective:   Physical Exam  Constitutional: She appears well-developed and well-nourished.  HENT:  Head: Normocephalic and atraumatic.  Eyes: EOM are normal.  Neck: Normal range of motion.  Cardiovascular: Normal rate and regular rhythm.   Pulmonary/Chest: Effort normal and breath sounds normal. No respiratory distress. She has no wheezes. She has no rales.  Abdominal: Soft. Bowel sounds are normal. She exhibits no distension. There is no tenderness.  Skin: Skin is  warm and dry. There is erythema.  Faint lacy rash on trunk, extremities. Not on palms and soles.    Filed Vitals:   07/06/14 1025  BP: 124/88  Pulse: 94  Temp: 98.4 F (36.9 C)  TempSrc: Oral  Resp: 14  Height: 5\' 2"  (1.575 m)  Weight: 195 lb 3.2 oz (88.542 kg)  SpO2: 97%     Assessment & Plan:

## 2014-07-07 LAB — HIV ANTIBODY (ROUTINE TESTING W REFLEX): HIV 1&2 Ab, 4th Generation: NONREACTIVE

## 2014-07-09 DIAGNOSIS — R509 Fever, unspecified: Secondary | ICD-10-CM | POA: Insufficient documentation

## 2014-07-09 DIAGNOSIS — R21 Rash and other nonspecific skin eruption: Secondary | ICD-10-CM | POA: Insufficient documentation

## 2014-07-09 NOTE — Assessment & Plan Note (Signed)
Lacy rash covering much of the body. No pustules or prominences, smooth, reddish. Does not look like RMSF. Advise benadryl for itching, see fever A/P for further details.

## 2014-07-09 NOTE — Assessment & Plan Note (Signed)
Highest fever captured at the ED 100.3, today normal however took tylenol about 8 hours prior. Likely truly afebrile. With rash. No clinical suspicion for RMSF with no exposure and rash does not match. No other infectious etiology or symptoms. Check blood cultures today to ensure not missing bacteremia, recheck CBC to follow low platelet count at the ED. Have stopped her acyclovir and robaxin. Have looked up her other medications and others not as suspicious for the rash. If still having fevers Monday will return to clinic or increase or lack of resolution of the rash.

## 2014-07-10 ENCOUNTER — Encounter: Payer: Self-pay | Admitting: Internal Medicine

## 2014-07-10 ENCOUNTER — Ambulatory Visit (INDEPENDENT_AMBULATORY_CARE_PROVIDER_SITE_OTHER): Payer: BLUE CROSS/BLUE SHIELD | Admitting: Internal Medicine

## 2014-07-10 VITALS — BP 132/80 | HR 103 | Temp 98.4°F | Resp 14 | Wt 195.0 lb

## 2014-07-10 DIAGNOSIS — R509 Fever, unspecified: Secondary | ICD-10-CM

## 2014-07-10 DIAGNOSIS — R21 Rash and other nonspecific skin eruption: Secondary | ICD-10-CM

## 2014-07-10 LAB — CULTURE, BLOOD (ROUTINE X 2)
Culture: NO GROWTH
Culture: NO GROWTH

## 2014-07-10 NOTE — Assessment & Plan Note (Signed)
At this time appear to be resolved, 2 sets of blood cultures negative and workup for infection without cause. Was likely a virus which is now resolving.

## 2014-07-10 NOTE — Progress Notes (Signed)
   Subjective:    Patient ID: Terri Hurley, female    DOB: [birth year], 54 y.o.   MRN: [id number]  HPI The patient is a 54 YO female who is coming in to follow up on her rash and fevers from last week. She has stopped the acyclovir as directed and stopped taking tylenol. She also denies pain medicine usage. She has not had any fevers. The rash is overall improved but now she is having some itchiness and redness on her hands. Her feet are normal and she thinks her face is a little redder than usual. Her stomach is better and her legs are improved but still mildly red.   Review of Systems  Constitutional: Negative for fever, chills, activity change and fatigue.  HENT: Negative.   Respiratory: Negative.   Cardiovascular: Negative.   Gastrointestinal: Negative.   Musculoskeletal: Negative for myalgias and arthralgias.  Skin: Positive for rash.  Neurological: Negative for dizziness, weakness, light-headedness and headaches.  Psychiatric/Behavioral: Negative.       Objective:   Physical Exam  Constitutional: She appears well-developed and well-nourished.  HENT:  Head: Normocephalic and atraumatic.  Eyes: EOM are normal.  Neck: Normal range of motion.  Cardiovascular: Normal rate and regular rhythm.   Pulmonary/Chest: Effort normal and breath sounds normal. No respiratory distress. She has no wheezes. She has no rales.  Abdominal: Soft. Bowel sounds are normal. She exhibits no distension. There is no tenderness.  Skin: Skin is warm and dry.  Rash on the trunk and extremities gone, hands mildly red no papules or macules. Face with some redness no papules or macules and no itching during our visit.    Filed Vitals:   07/10/14 1020  BP: 132/80  Pulse: 103  Temp: 98.4 F (36.9 C)  TempSrc: Oral  Resp: 14  Weight: 195 lb (88.451 kg)  SpO2: 97%      Assessment & Plan:

## 2014-07-10 NOTE — Patient Instructions (Signed)
The rash looks like it is getting better. We will give it another couple of days to go away entirely. If you feel like the rash is getting worse, is not better in 1 week please call us back and we may send you to a skin specialist.   You can continue to use the benadryl if you are having itching.   You can use a lotion without scents to keep your skin moisturized and decrease the itching.   Try not to scratch.   Drug Rash Skin reactions can be caused by several different drugs. Allergy to the medicine can cause itching, hives, and other rashes. Sun exposure causes a red rash with some medicines. Mononucleosis virus can cause a similar red rash when you are taking antibiotics. Sometimes, the rash may be accompanied by pain. The drug rash may happen with new drugs or with medicines that you have been taking for a while. The rash cannot be spread from person to person. In most cases, the symptoms of a drug rash are gone within a few days of stopping the medicine. Your rash, including hives (urticaria), is most likely from the following medicines:  Antibiotics or antimicrobials.  Anticonvulsants or seizure medicines.  Antihypertensives or blood pressure medicines.  Antimalarials.  Antidepressants or depression medicines.  Antianxiety drugs.  Diuretics or water pills.  Nonsteroidal anti-inflammatory drugs.  Simvastatin.  Lithium.  Omeprazole.  Allopurinol.  Pseudoephedrine.  Amiodarone.  Packed red blood cells, when you get a blood transfusion.  Contrast media, such as when getting an imaging test (CT or CAT scan). This drug list is not all inclusive, but drug rashes have been reported with all the medicines listed above. Your caregiver will tell you which medicines to avoid. If you react to a medicine, a similar or worse reaction can occur the next time you take it. If you need to stop taking an antibiotic because of a drug rash, an alternative antibiotic may be needed to get  rid of your infection. Antihistamine or cortisone drugs may be prescribed to help relieve your symptoms. Stay out of the sun until the rash is completely gone.  Be sure to let your caregiver know about your drug reaction. Do not take this medicine in the future. Call your caregiver if your drug rash does not improve within 3 to 4 days. SEEK IMMEDIATE MEDICAL CARE IF:   You develop breathing problems, swelling in the throat, or wheezing.  You have weakness, fainting, fever, and muscle or joint pains.  You develop blisters or peeling of skin, especially around the mouth. Document Released: 07/16/2004 Document Revised: 10/23/2013 Document Reviewed: 04/26/2008 [facility] Patient Information 2015 [place], [place]. This information is not intended to replace advice given to you by your health care provider. Make sure you discuss any questions you have with your health care provider.

## 2014-07-10 NOTE — Progress Notes (Signed)
Pre visit review using our clinic review tool, if applicable. No additional management support is needed unless otherwise documented below in the visit note. 

## 2014-07-10 NOTE — Assessment & Plan Note (Signed)
Doing much better, possibly drug eruption. She has stopped acyclovir and advised her to continue off it. Rash is resolving overall and feel that the redness on her hands is unrelated and may be related to cold or dry skin. Have advised unscented lotion for dry skin.

## 2014-07-12 LAB — CULTURE, BLOOD (SINGLE): Organism ID, Bacteria: NO GROWTH

## 2014-07-20 ENCOUNTER — Telehealth: Payer: Self-pay | Admitting: Internal Medicine

## 2014-07-20 DIAGNOSIS — R21 Rash and other nonspecific skin eruption: Secondary | ICD-10-CM

## 2014-07-20 NOTE — Telephone Encounter (Signed)
Pt called in wanted to know if Dr Asa Lente could put in a blood test for her to see if she allergic to some of her meds that she is taking.  She has had this on going rash that she cant get rid of?

## 2014-07-21 ENCOUNTER — Ambulatory Visit (INDEPENDENT_AMBULATORY_CARE_PROVIDER_SITE_OTHER): Payer: BLUE CROSS/BLUE SHIELD | Admitting: Family Medicine

## 2014-07-21 VITALS — BP 138/88 | HR 104 | Temp 98.2°F | Resp 16 | Ht 62.0 in | Wt 196.2 lb

## 2014-07-21 DIAGNOSIS — IMO0001 Reserved for inherently not codable concepts without codable children: Secondary | ICD-10-CM

## 2014-07-21 DIAGNOSIS — M609 Myositis, unspecified: Secondary | ICD-10-CM

## 2014-07-21 DIAGNOSIS — R21 Rash and other nonspecific skin eruption: Secondary | ICD-10-CM

## 2014-07-21 DIAGNOSIS — R319 Hematuria, unspecified: Secondary | ICD-10-CM

## 2014-07-21 DIAGNOSIS — M791 Myalgia: Secondary | ICD-10-CM

## 2014-07-21 DIAGNOSIS — R3915 Urgency of urination: Secondary | ICD-10-CM

## 2014-07-21 DIAGNOSIS — R300 Dysuria: Secondary | ICD-10-CM

## 2014-07-21 LAB — POCT URINALYSIS DIPSTICK
Bilirubin, UA: NEGATIVE
Glucose, UA: NEGATIVE
Ketones, UA: NEGATIVE
Nitrite, UA: POSITIVE
Protein, UA: 30
Spec Grav, UA: 1.015
Urobilinogen, UA: 0.2
pH, UA: 6

## 2014-07-21 LAB — POCT UA - MICROSCOPIC ONLY
Casts, Ur, LPF, POC: NEGATIVE
Crystals, Ur, HPF, POC: NEGATIVE
Mucus, UA: NEGATIVE
Yeast, UA: NEGATIVE

## 2014-07-21 LAB — POCT SEDIMENTATION RATE: POCT SED RATE: 47 mm/hr — AB (ref 0–22)

## 2014-07-21 MED ORDER — METHYLPREDNISOLONE (PAK) 4 MG PO TABS: ORAL_TABLET | ORAL | Status: DC

## 2014-07-21 MED ORDER — CIPROFLOXACIN HCL 500 MG PO TABS: 500.0000 mg | ORAL_TABLET | Freq: Two times a day (BID) | ORAL | Status: DC

## 2014-07-21 NOTE — Telephone Encounter (Signed)
Unfortunately, there is no blood test for "med allergy" but I will make a referral to an allergist for further eval and tx as needed [facility] will call with this appt once made thanks

## 2014-07-21 NOTE — Patient Instructions (Signed)
Your urine suggest significant infection. In addition I'm concerned that he may have an underlying connective tissue disorder. I'm running some tests to look into this but they will be back until early next week.  I am giving you medicines stop the rash and some other medicine to treat you for urinary infection.
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
## 2014-07-23 LAB — URINE CULTURE: Colony Count: >=100000

## 2014-07-23 LAB — ANA: Anti Nuclear Antibody(ANA): NEGATIVE

## 2014-07-23 LAB — JO-1 ANTIBODY-IGG: Jo-1 Antibody, IgG: <1

## 2014-07-24 NOTE — Telephone Encounter (Signed)
LVM for pt to call back.  RE: MD response below.

## 2014-07-25 LAB — ANTISTREPTOLYSIN O TITER: ASO: 103 IU/mL (ref <409)

## 2014-07-25 NOTE — Telephone Encounter (Signed)
Pt informed of referral and is agreeable to it.

## 2014-07-26 ENCOUNTER — Other Ambulatory Visit: Payer: Self-pay | Admitting: Family Medicine

## 2014-07-26 ENCOUNTER — Encounter: Payer: Self-pay | Admitting: Family Medicine

## 2014-07-26 DIAGNOSIS — R700 Elevated erythrocyte sedimentation rate: Secondary | ICD-10-CM

## 2014-07-26 DIAGNOSIS — M25569 Pain in unspecified knee: Secondary | ICD-10-CM

## 2014-07-26 NOTE — Addendum Note (Signed)
Addended by: Constance Goltz on: 07/26/2014 10:19 AM   Modules accepted: Orders, SmartSet

## 2014-08-07 ENCOUNTER — Telehealth: Payer: Self-pay | Admitting: Internal Medicine

## 2014-08-07 NOTE — Telephone Encounter (Signed)
Rec'd from [facility] [place] forward 2 pages to Dr. Asa Lente

## 2014-08-29 ENCOUNTER — Encounter: Payer: Self-pay | Admitting: Internal Medicine

## 2014-08-30 MED ORDER — DIAZEPAM 5 MG PO TABS: 2.5000 mg | ORAL_TABLET | Freq: Two times a day (BID) | ORAL | Status: DC | PRN

## 2014-09-05 ENCOUNTER — Ambulatory Visit (INDEPENDENT_AMBULATORY_CARE_PROVIDER_SITE_OTHER): Payer: BLUE CROSS/BLUE SHIELD | Admitting: Emergency Medicine

## 2014-09-05 VITALS — BP 146/86 | HR 97 | Temp 98.2°F | Resp 20 | Ht 62.0 in | Wt 196.6 lb

## 2014-09-05 DIAGNOSIS — R05 Cough: Secondary | ICD-10-CM

## 2014-09-05 DIAGNOSIS — R059 Cough, unspecified: Secondary | ICD-10-CM

## 2014-09-05 DIAGNOSIS — M542 Cervicalgia: Secondary | ICD-10-CM

## 2014-09-05 DIAGNOSIS — K219 Gastro-esophageal reflux disease without esophagitis: Secondary | ICD-10-CM | POA: Diagnosis not present

## 2014-09-05 MED ORDER — GUAIFENESIN-CODEINE 100-10 MG/5ML PO SYRP: 5.0000 mL | ORAL_SOLUTION | Freq: Three times a day (TID) | ORAL | Status: DC | PRN

## 2014-09-05 MED ORDER — DIAZEPAM 5 MG PO TABS: 2.5000 mg | ORAL_TABLET | Freq: Two times a day (BID) | ORAL | Status: DC | PRN

## 2014-09-05 MED ORDER — RANITIDINE HCL 150 MG PO TABS: ORAL_TABLET | ORAL | Status: DC
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
## 2014-09-05 NOTE — Patient Instructions (Signed)
Cough, Adult  A cough is a reflex that helps clear your throat and airways. It can help heal the body or may be a reaction to an irritated airway. A cough may only last 2 or 3 weeks (acute) or may last more than 8 weeks (chronic).  CAUSES Acute cough:  Viral or bacterial infections. Chronic cough:  Infections.  Allergies.  Asthma.  Post-nasal drip.  Smoking.  Heartburn or acid reflux.  Some medicines.  Chronic lung problems (COPD).  Cancer. SYMPTOMS   Cough.  Fever.  Chest pain.  Increased breathing rate.  High-pitched whistling sound when breathing (wheezing).  Colored mucus that you cough up (sputum). TREATMENT   A bacterial cough may be treated with antibiotic medicine.  A viral cough must run its course and will not respond to antibiotics.  Your caregiver may recommend other treatments if you have a chronic cough. HOME CARE INSTRUCTIONS   Only take over-the-counter or prescription medicines for pain, discomfort, or fever as directed by your caregiver. Use cough suppressants only as directed by your caregiver.  Use a cold steam vaporizer or humidifier in your bedroom or home to help loosen secretions.  Sleep in a semi-upright position if your cough is worse at night.  Rest as needed.  Stop smoking if you smoke. SEEK IMMEDIATE MEDICAL CARE IF:   You have pus in your sputum.  Your cough starts to worsen.  You cannot control your cough with suppressants and are losing sleep.  You begin coughing up blood.  You have difficulty breathing.  You develop pain which is getting worse or is uncontrolled with medicine.  You have a fever. MAKE SURE YOU:   Understand these instructions.  Will watch your condition.  Will get help right away if you are not doing well or get worse. Document Released: 12/05/2010 Document Revised: 08/31/2011 Document Reviewed: 12/05/2010 Exit[employer] Patient Information 2015 [employer]. This information is not intended  to replace advice given to you by your health care provider. Make sure you discuss any questions you have with your health care provider.  

## 2014-12-03 ENCOUNTER — Ambulatory Visit (INDEPENDENT_AMBULATORY_CARE_PROVIDER_SITE_OTHER): Payer: BLUE CROSS/BLUE SHIELD | Admitting: Emergency Medicine

## 2014-12-03 VITALS — BP 142/76 | HR 88 | Temp 98.8°F | Resp 17 | Ht 62.0 in | Wt 189.6 lb

## 2014-12-03 DIAGNOSIS — F32A Depression, unspecified: Secondary | ICD-10-CM

## 2014-12-03 DIAGNOSIS — A6000 Herpesviral infection of urogenital system, unspecified: Secondary | ICD-10-CM | POA: Diagnosis not present

## 2014-12-03 DIAGNOSIS — F329 Major depressive disorder, single episode, unspecified: Secondary | ICD-10-CM

## 2014-12-03 MED ORDER — ESCITALOPRAM OXALATE 10 MG PO TABS: 10.0000 mg | ORAL_TABLET | Freq: Every day | ORAL | Status: DC

## 2014-12-03 MED ORDER — OMEPRAZOLE 40 MG PO CPDR: 40.0000 mg | DELAYED_RELEASE_CAPSULE | Freq: Every day | ORAL | Status: DC

## 2014-12-03 MED ORDER — ONDANSETRON 8 MG PO TBDP: 8.0000 mg | ORAL_TABLET | Freq: Three times a day (TID) | ORAL | Status: DC | PRN

## 2014-12-03 MED ORDER — ACYCLOVIR 400 MG PO TABS: 400.0000 mg | ORAL_TABLET | ORAL | Status: DC

## 2014-12-03 MED ORDER — BUPROPION HCL ER (XL) 300 MG PO TB24: 300.0000 mg | ORAL_TABLET | Freq: Every day | ORAL | Status: DC
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
## 2014-12-03 NOTE — Patient Instructions (Signed)
Genital Herpes °Genital herpes is a sexually transmitted disease. This means that it is a disease passed by having sex with an infected person. There is no cure for genital herpes. The time between attacks can be months to years. The virus may live in a person but produce no problems (symptoms). This infection can be passed to a baby as it travels down the birth canal (vagina). In a newborn, this can cause central nervous system damage, eye damage, or even death. The virus that causes genital herpes is usually HSV-2 virus. The virus that causes oral herpes is usually HSV-1. The diagnosis (learning what is wrong) is made through culture results. °SYMPTOMS  °Usually symptoms of pain and itching begin a few days to a week after contact. It first appears as small blisters that progress to small painful ulcers which then scab over and heal after several days. It affects the outer genitalia, birth canal, cervix, penis, anal area, buttocks, and thighs. °HOME CARE INSTRUCTIONS  °· Keep ulcerated areas dry and clean. °· Take medications as directed. Antiviral medications can speed up healing. They will not prevent recurrences or cure this infection. These medications can also be taken for suppression if there are frequent recurrences. °· While the infection is active, it is contagious. Avoid all sexual contact during active infections. °· Condoms may help prevent spread of the herpes virus. °· Practice safe sex. °· Wash your hands thoroughly after touching the genital area. °· Avoid touching your eyes after touching your genital area. °· Inform your caregiver if you have had genital herpes and become pregnant. It is your responsibility to insure a safe outcome for your baby in this pregnancy. °· Only take over-the-counter or prescription medicines for pain, discomfort, or fever as directed by your caregiver. °SEEK MEDICAL CARE IF:  °· You have a recurrence of this infection. °· You do not respond to medications and are not  improving. °· You have new sources of pain or discharge which have changed from the original infection. °· You have an oral temperature above 102° F (38.9° C). °· You develop abdominal pain. °· You develop eye pain or signs of eye infection. °Document Released: 06/05/2000 Document Revised: 08/31/2011 Document Reviewed: 06/26/2009 °ExitCare® Patient Information ©2015 [employer]. This information is not intended to replace advice given to you by your health care provider. Make sure you discuss any questions you have with your health care provider. ° °

## 2014-12-07 ENCOUNTER — Encounter: Payer: BLUE CROSS/BLUE SHIELD | Admitting: Family

## 2014-12-07 ENCOUNTER — Encounter: Payer: BC Managed Care – PPO | Admitting: Internal Medicine

## 2014-12-12 ENCOUNTER — Ambulatory Visit: Payer: BLUE CROSS/BLUE SHIELD | Admitting: Family Medicine

## 2015-01-16 ENCOUNTER — Encounter: Payer: BLUE CROSS/BLUE SHIELD | Admitting: Family Medicine

## 2015-01-18 ENCOUNTER — Ambulatory Visit (INDEPENDENT_AMBULATORY_CARE_PROVIDER_SITE_OTHER): Payer: BLUE CROSS/BLUE SHIELD | Admitting: Family Medicine

## 2015-01-18 ENCOUNTER — Encounter: Payer: Self-pay | Admitting: Family Medicine

## 2015-01-18 VITALS — BP 169/85 | HR 72 | Temp 98.1°F | Resp 16 | Ht 61.75 in | Wt 187.4 lb

## 2015-01-18 DIAGNOSIS — A609 Anogenital herpesviral infection, unspecified: Secondary | ICD-10-CM | POA: Diagnosis not present

## 2015-01-18 DIAGNOSIS — F418 Other specified anxiety disorders: Secondary | ICD-10-CM

## 2015-01-18 DIAGNOSIS — Z0000 Encounter for general adult medical examination without abnormal findings: Secondary | ICD-10-CM | POA: Diagnosis not present

## 2015-01-18 DIAGNOSIS — R030 Elevated blood-pressure reading, without diagnosis of hypertension: Secondary | ICD-10-CM | POA: Diagnosis not present

## 2015-01-18 DIAGNOSIS — M5030 Other cervical disc degeneration, unspecified cervical region: Secondary | ICD-10-CM | POA: Diagnosis not present

## 2015-01-18 DIAGNOSIS — E559 Vitamin D deficiency, unspecified: Secondary | ICD-10-CM | POA: Diagnosis not present

## 2015-01-18 DIAGNOSIS — Z131 Encounter for screening for diabetes mellitus: Secondary | ICD-10-CM | POA: Diagnosis not present

## 2015-01-18 DIAGNOSIS — IMO0001 Reserved for inherently not codable concepts without codable children: Secondary | ICD-10-CM

## 2015-01-18 DIAGNOSIS — F329 Major depressive disorder, single episode, unspecified: Secondary | ICD-10-CM

## 2015-01-18 DIAGNOSIS — A6000 Herpesviral infection of urogenital system, unspecified: Secondary | ICD-10-CM

## 2015-01-18 DIAGNOSIS — F32A Depression, unspecified: Secondary | ICD-10-CM

## 2015-01-18 DIAGNOSIS — F419 Anxiety disorder, unspecified: Secondary | ICD-10-CM

## 2015-01-18 LAB — POCT URINALYSIS DIPSTICK
Bilirubin, UA: NEGATIVE
Blood, UA: NEGATIVE
Glucose, UA: NEGATIVE
Ketones, UA: NEGATIVE
Leukocytes, UA: NEGATIVE
Nitrite, UA: NEGATIVE
Protein, UA: NEGATIVE
Spec Grav, UA: 1.01
Urobilinogen, UA: 0.2
pH, UA: 6.5

## 2015-01-18 LAB — CBC WITH DIFFERENTIAL/PLATELET
Basophils Absolute: 0.1 10*3/uL (ref 0.0–0.1)
Basophils Relative: 1 % (ref 0–1)
Eosinophils Absolute: 0.1 10*3/uL (ref 0.0–0.7)
Eosinophils Relative: 1 % (ref 0–5)
HCT: 39.6 % (ref 36.0–46.0)
Hemoglobin: 13.2 g/dL (ref 12.0–15.0)
Lymphocytes Relative: 22 % (ref 12–46)
Lymphs Abs: 1.2 10*3/uL (ref 0.7–4.0)
MCH: 30.1 pg (ref 26.0–34.0)
MCHC: 33.3 g/dL (ref 30.0–36.0)
MCV: 90.4 fL (ref 78.0–100.0)
MPV: 10.9 fL (ref 8.6–12.4)
Monocytes Absolute: 0.3 10*3/uL (ref 0.1–1.0)
Monocytes Relative: 6 % (ref 3–12)
Neutro Abs: 3.9 10*3/uL (ref 1.7–7.7)
Neutrophils Relative %: 70 % (ref 43–77)
Platelets: 284 10*3/uL (ref 150–400)
RBC: 4.38 MIL/uL (ref 3.87–5.11)
RDW: 14.6 % (ref 11.5–15.5)
WBC: 5.6 10*3/uL (ref 4.0–10.5)

## 2015-01-18 LAB — HEMOGLOBIN A1C
Hgb A1c MFr Bld: 5.5 % (ref <5.7)
Mean Plasma Glucose: 111 mg/dL (ref <117)

## 2015-01-18 LAB — VITAMIN B12: Vitamin B-12: 306 pg/mL (ref 211–911)

## 2015-01-18 LAB — TSH: TSH: 1.114 u[IU]/mL (ref 0.350–4.500)

## 2015-01-18 MED ORDER — OMEPRAZOLE 40 MG PO CPDR: 40.0000 mg | DELAYED_RELEASE_CAPSULE | Freq: Every day | ORAL | Status: DC

## 2015-01-18 MED ORDER — BUPROPION HCL ER (XL) 300 MG PO TB24: 300.0000 mg | ORAL_TABLET | Freq: Every day | ORAL | Status: DC

## 2015-01-18 MED ORDER — ESCITALOPRAM OXALATE 10 MG PO TABS: 10.0000 mg | ORAL_TABLET | Freq: Every day | ORAL | Status: DC

## 2015-01-18 MED ORDER — METHOCARBAMOL 750 MG PO TABS: 750.0000 mg | ORAL_TABLET | Freq: Three times a day (TID) | ORAL | Status: DC | PRN

## 2015-01-18 MED ORDER — DIAZEPAM 5 MG PO TABS: 2.5000 mg | ORAL_TABLET | Freq: Two times a day (BID) | ORAL | Status: DC | PRN
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
## 2015-01-18 NOTE — Patient Instructions (Signed)

## 2015-01-19 LAB — ACUTE HEP PANEL AND HEP B SURFACE AB
HCV Ab: NEGATIVE
Hep A IgM: NONREACTIVE
Hep B C IgM: NONREACTIVE
Hep B S Ab: NEGATIVE
Hepatitis B Surface Ag: NEGATIVE

## 2015-01-19 LAB — COMPLETE METABOLIC PANEL WITH GFR
ALT: 43 U/L — ABNORMAL HIGH (ref 6–29)
AST: 26 U/L (ref 10–35)
Albumin: 4 g/dL (ref 3.6–5.1)
Alkaline Phosphatase: 82 U/L (ref 33–130)
BUN: 11 mg/dL (ref 7–25)
CO2: 25 mmol/L (ref 20–31)
Calcium: 8.9 mg/dL (ref 8.6–10.4)
Chloride: 106 mmol/L (ref 98–110)
Creat: 1 mg/dL (ref 0.50–1.05)
GFR, Est African American: 74 mL/min (ref >=60)
GFR, Est Non African American: 64 mL/min (ref >=60)
Glucose, Bld: 87 mg/dL (ref 65–99)
Potassium: 4.3 mmol/L (ref 3.5–5.3)
Sodium: 141 mmol/L (ref 135–146)
Total Bilirubin: 0.5 mg/dL (ref 0.2–1.2)
Total Protein: 6.5 g/dL (ref 6.1–8.1)

## 2015-01-19 LAB — VITAMIN D 25 HYDROXY (VIT D DEFICIENCY, FRACTURES): Vit D, 25-Hydroxy: 22 ng/mL — ABNORMAL LOW (ref 30–100)

## 2015-01-19 LAB — LIPID PANEL
Cholesterol: 206 mg/dL — ABNORMAL HIGH (ref 125–200)
HDL: 62 mg/dL (ref >=46)
LDL Cholesterol: 124 mg/dL (ref <130)
Total CHOL/HDL Ratio: 3.3 Ratio (ref <=5.0)
Triglycerides: 100 mg/dL (ref <150)
VLDL: 20 mg/dL (ref <30)

## 2015-01-19 LAB — RPR: RPR Ser Ql: REACTIVE — AB

## 2015-01-19 LAB — HIV ANTIBODY (ROUTINE TESTING W REFLEX): HIV 1&2 Ab, 4th Generation: NONREACTIVE

## 2015-01-19 LAB — RPR TITER: RPR Titer: 1:1 {titer}

## 2015-01-21 LAB — FLUORESCENT TREPONEMAL AB(FTA)-IGG-BLD: Fluorescent Treponemal ABS: NONREACTIVE

## 2015-02-20 ENCOUNTER — Encounter: Payer: Self-pay | Admitting: Family Medicine

## 2015-02-20 DIAGNOSIS — Z8249 Family history of ischemic heart disease and other diseases of the circulatory system: Secondary | ICD-10-CM

## 2015-03-21 NOTE — Progress Notes (Signed)
Cardiology Office Note   Date:  03/22/2015   ID:  Terri Hurley, DOB 1960/09/26, MRN [id number]  PCP:  Gwendolyn Grant, MD  Cardiologist:   Sharol Harness, MD   Chief Complaint  Patient presents with  . New Evaluation    pt c/o no dizziness  . Chest Pain    c/o hurting in the back that radiates to chest  . Edema    off and on in ankles      History of Present Illness: Terri Hurley is a 54 y.o. female with NASH and obesity who presents for an evaluation of family history of cardiomyopathy.  She reports that her mother died last month of HOCM.  Her mother's cardiologist recommended that all her siblings be tested for HCM.  She denies any chest pain, palpitations, lightheadedness, dizziness, orthopnea or PND.  She does note occasional LE edema that improves with elevation of her legs.  She is currently remodeling her home and has noted shortness of breath when ripping up carpet or painting.  However, she is able to complete these activities.  She does report occasional sharp substernal CP that occurs at rest and aching in her L shoulder that occurs when other muscles are aching.  This has been often recently as she is doing lot of manual labor at home.  However, she does not have this with exertion or when her other muscles are not aching.  Terri Hurley does not get any formal exercise.  She reports that her diet is poor.  Her biggest challenge is sweet tea and sodas.  She has been told that her BP is slightly high and is to follow up on this with her PCP in November or December.    Past Medical History  Diagnosis Date  . Depression     Sees [name], NP @ [facility]  counseling center.Marland Kitchen Has h/o hospitalization  . GERD (gastroesophageal reflux disease)     controlled with PPI therapy  . Fatty liver disease, nonalcoholic     clinical diagnosis by endocrinologist  . High cholesterol     No medical therapy. Last lab March '12  LDL 91, T. Chol 170. Minimal elevation in '08    . Vitamin D deficiency     lab '09  Vit D = 19  . Urinary incontinence   . HNP (herniated nucleus pulposus), cervical     C6-7. Has had PT, no surgery  . Hepatitis   . Plantar fasciitis   . Recurrent genital HSV (herpes simplex virus) infection     on daily suppression with acyclovir  . Neuromuscular disorder   . Arthritis     low back, hip, hands    Past Surgical History  Procedure Laterality Date  . Incontinence surgery  2009  . Cholecystectomy    . Total abdominal hysterectomy w/ bilateral salpingoophorectomy  2009    with repair of cystocele and rectocele   . Abdominal hysterectomy      bladder surgery; uterine polyps.  Ovaries resected.  Marland Kitchen Spine surgery  05/2014    Cervical spine surgery C4-5, C5-6.  [place].     Current Outpatient Prescriptions  Medication Sig Dispense Refill  . acyclovir (ZOVIRAX) 400 MG tablet Take 1 tablet (400 mg total) by mouth 1 day or 1 dose. 30 tablet 12  . buPROPion (WELLBUTRIN XL) 300 MG 24 hr tablet Take 1 tablet (300 mg total) by mouth daily. 90 tablet 3  . diazepam (VALIUM) 5 MG tablet  Take 0.5-1 tablets (2.5-5 mg total) by mouth every 12 (twelve) hours as needed for anxiety. 30 tablet 0  . escitalopram (LEXAPRO) 10 MG tablet Take 1 tablet (10 mg total) by mouth daily. 90 tablet 3  . methocarbamol (ROBAXIN) 750 MG tablet Take 1 tablet (750 mg total) by mouth every 8 (eight) hours as needed for muscle spasms. 90 tablet 2  . omeprazole (PRILOSEC) 40 MG capsule Take 1 capsule (40 mg total) by mouth daily. 90 capsule 3  . senna-docusate (SENOKOT-S) 8.6-50 MG per tablet Take 2 tablets by mouth 2 (two) times daily.     . hydrochlorothiazide (MICROZIDE) 12.5 MG capsule Take 1 capsule (12.5 mg total) by mouth daily. 90 capsule 3   No current facility-administered medications for this visit.    Allergies:   Penicillins    Social History:  The patient  reports that she has never smoked. She has never used smokeless tobacco. She reports that she  does not drink alcohol or use illicit drugs.   Family History:  The patient's family history includes Asthma in her sister; COPD in her brother and sister; Cancer in her sister; Diabetes in her father and mother; Gout in her mother; Heart disease in her brother and father; Heart disease (age of onset: 60) in her mother; Hyperlipidemia in her brother, brother, father, and mother; Hypertension in her brother and mother; Mental illness in her brother, brother, brother, brother, father, mother, sister, sister, and sister; Stroke in her brother; Thyroid disease in her sister.    ROS:  Please see the history of present illness.   Otherwise, review of systems are positive for depression, lack of motivation.   All other systems are reviewed and negative.    PHYSICAL EXAM: VS:  BP 148/100 mmHg  Pulse 68  Ht 5' 1.75" (1.568 m)  Wt 85.231 kg (187 lb 14.4 oz)  BMI 34.67 kg/m2 , BMI Body mass index is 34.67 kg/(m^2). GENERAL:  Well appearing HEENT:  Pupils equal round and reactive, fundi not visualized, oral mucosa unremarkable NECK:  No jugular venous distention, waveform within normal limits, carotid upstroke brisk and symmetric, no bruits, no thyromegaly LYMPHATICS:  No cervical adenopathy LUNGS:  Clear to auscultation bilaterally HEART:  RRR.  PMI not displaced or sustained,S1 and S2 within normal limits, no S3, no S4, no clicks, no rubs, no murmurs ABD:  Flat, positive bowel sounds normal in frequency in pitch, no bruits, no rebound, no guarding, no midline pulsatile mass, no hepatomegaly, no splenomegaly EXT:  2 plus pulses throughout, no edema, no cyanosis no clubbing SKIN:  No rashes no nodules NEURO:  Cranial nerves II through XII grossly intact, motor grossly intact throughout PSYCH:  Cognitively intact, oriented to person place and time    EKG:  EKG is ordered today. The ekg ordered today demonstrates sinus rhythm at 68 bpm.   Recent Labs: 01/18/2015: ALT 43*; BUN 11; Creat 1.00;  Hemoglobin 13.2; Platelets 284; Potassium 4.3; Sodium 141; TSH 1.114    Lipid Panel    Component Value Date/Time   CHOL 206* 01/18/2015 1538   TRIG 100 01/18/2015 1538   HDL 62 01/18/2015 1538   CHOLHDL 3.3 01/18/2015 1538   VLDL 20 01/18/2015 1538   LDLCALC 124 01/18/2015 1538   LDLDIRECT 130.7 11/21/2012 0952      Wt Readings from Last 3 Encounters:  03/22/15 85.231 kg (187 lb 14.4 oz)  01/18/15 85.004 kg (187 lb 6.4 oz)  12/03/14 86.002 kg (189 lb 9.6 oz)  Other studies Reviewed: Additional studies/ records that were reviewed today include: . Review of the above records demonstrates:  Please see elsewhere in the note.     ASSESSMENT AND PLAN:  # Family history of HOCM:  Will order an echo to screen for HCM, especially given her report of shortness of breath and mild LE edema.  # Hypertension: Terri Hurley BP is high today and has been elevated on at least one other occasion. - Start HCTZ.  Will need a BMP in 2 weeks  # CP: Her symptoms sound very atypical and are not associated with exertion.  It seems more consistent with musculoskeletal strain in the setting of remodeling her home.  No ischemia work up for now.  # CV Disease Prevention: Encouraged at least 30-40 minutes of exercise most days.  Also encouraged her to limit sugary beverages to one per day and increasing water intake.   Current medicines are reviewed at length with the patient today.  The patient does not have concerns regarding medicines.  The following changes have been made:  Start HCTZ.  Labs/ tests ordered today include:   Orders Placed This Encounter  Procedures  . Basic metabolic panel  . EKG 12-Lead  . ECHOCARDIOGRAM COMPLETE     Disposition:   FU with Terri Stills C. Oval Linsey, MD as needed.    Signed, Sharol Harness, MD  03/22/2015 9:53 AM    [facility]

## 2015-03-22 ENCOUNTER — Encounter: Payer: Self-pay | Admitting: Cardiovascular Disease

## 2015-03-22 ENCOUNTER — Ambulatory Visit (INDEPENDENT_AMBULATORY_CARE_PROVIDER_SITE_OTHER): Payer: BLUE CROSS/BLUE SHIELD | Admitting: Cardiovascular Disease

## 2015-03-22 VITALS — BP 148/100 | HR 68 | Ht 61.75 in | Wt 187.9 lb

## 2015-03-22 DIAGNOSIS — R0609 Other forms of dyspnea: Secondary | ICD-10-CM | POA: Diagnosis not present

## 2015-03-22 DIAGNOSIS — I422 Other hypertrophic cardiomyopathy: Secondary | ICD-10-CM

## 2015-03-22 DIAGNOSIS — Z79899 Other long term (current) drug therapy: Secondary | ICD-10-CM | POA: Diagnosis not present

## 2015-03-22 DIAGNOSIS — R0600 Dyspnea, unspecified: Secondary | ICD-10-CM

## 2015-03-22 MED ORDER — HYDROCHLOROTHIAZIDE 12.5 MG PO CAPS
12.5000 mg | ORAL_CAPSULE | Freq: Every day | ORAL | Status: DC
Start: 2015-03-22 — End: 2016-01-28

## 2015-03-22 NOTE — Patient Instructions (Signed)
LAB-BMP IN 2 WEEKS AT PRIMARY OFFICE.  Your physician has requested that you have an echocardiogram at [address]. Echocardiography is a painless test that uses sound waves to create images of your heart. It provides your doctor with information about the size and shape of your heart and how well your heart's chambers and valves are working. This procedure takes approximately one hour. There are no restrictions for this procedure. Will call with results.   Start HCTZ 12.5 mg one tablet daily.   Your physician recommends that you schedule a follow-up appointment as needed.

## 2015-03-29 ENCOUNTER — Ambulatory Visit (HOSPITAL_COMMUNITY): Payer: BLUE CROSS/BLUE SHIELD | Attending: Cardiovascular Disease

## 2015-03-29 ENCOUNTER — Other Ambulatory Visit: Payer: Self-pay

## 2015-03-29 DIAGNOSIS — R0609 Other forms of dyspnea: Secondary | ICD-10-CM | POA: Diagnosis not present

## 2015-03-29 DIAGNOSIS — I422 Other hypertrophic cardiomyopathy: Secondary | ICD-10-CM

## 2015-03-29 DIAGNOSIS — R0600 Dyspnea, unspecified: Secondary | ICD-10-CM | POA: Diagnosis not present

## 2015-03-29 DIAGNOSIS — Z79899 Other long term (current) drug therapy: Secondary | ICD-10-CM

## 2015-04-01 ENCOUNTER — Telehealth: Payer: Self-pay | Admitting: *Deleted

## 2015-04-01 NOTE — Telephone Encounter (Signed)
Spoke to patient. Result given . Verbalized understanding  

## 2015-04-01 NOTE — Telephone Encounter (Signed)
-----   Message from Skeet Latch, MD sent at 03/31/2015 11:03 AM EDT ----- Normal size and squeezing function of the heart muscle.  Her heart does not relax completely, which is likely because of her elevated blood pressure.  It will be important to control the blood pressure.

## 2015-05-13 ENCOUNTER — Ambulatory Visit (INDEPENDENT_AMBULATORY_CARE_PROVIDER_SITE_OTHER): Payer: BLUE CROSS/BLUE SHIELD | Admitting: Family Medicine

## 2015-05-13 ENCOUNTER — Encounter: Payer: Self-pay | Admitting: Family Medicine

## 2015-05-13 VITALS — BP 130/82 | HR 77 | Temp 98.8°F | Resp 16 | Ht 61.5 in | Wt 184.0 lb

## 2015-05-13 DIAGNOSIS — I10 Essential (primary) hypertension: Secondary | ICD-10-CM | POA: Diagnosis not present

## 2015-05-13 DIAGNOSIS — K760 Fatty (change of) liver, not elsewhere classified: Secondary | ICD-10-CM

## 2015-05-13 DIAGNOSIS — E559 Vitamin D deficiency, unspecified: Secondary | ICD-10-CM

## 2015-05-13 DIAGNOSIS — R350 Frequency of micturition: Secondary | ICD-10-CM

## 2015-05-13 LAB — COMPREHENSIVE METABOLIC PANEL
ALT: 86 U/L — ABNORMAL HIGH (ref 6–29)
AST: 55 U/L — ABNORMAL HIGH (ref 10–35)
Albumin: 4.1 g/dL (ref 3.6–5.1)
Alkaline Phosphatase: 87 U/L (ref 33–130)
BUN: 9 mg/dL (ref 7–25)
CO2: 29 mmol/L (ref 20–31)
Calcium: 9.2 mg/dL (ref 8.6–10.4)
Chloride: 95 mmol/L — ABNORMAL LOW (ref 98–110)
Creat: 0.92 mg/dL (ref 0.50–1.05)
Glucose, Bld: 78 mg/dL (ref 65–99)
Potassium: 4.2 mmol/L (ref 3.5–5.3)
Sodium: 134 mmol/L — ABNORMAL LOW (ref 135–146)
Total Bilirubin: 0.6 mg/dL (ref 0.2–1.2)
Total Protein: 6.9 g/dL (ref 6.1–8.1)

## 2015-05-13 LAB — POCT URINALYSIS DIP (MANUAL ENTRY)
Bilirubin, UA: NEGATIVE
Glucose, UA: NEGATIVE
Ketones, POC UA: NEGATIVE
Nitrite, UA: POSITIVE — AB
Protein Ur, POC: 30 — AB
Spec Grav, UA: 1.02
Urobilinogen, UA: 0.2
pH, UA: 7

## 2015-05-13 LAB — POC MICROSCOPIC URINALYSIS (UMFC): Mucus: ABSENT

## 2015-05-13 MED ORDER — NITROFURANTOIN MONOHYD MACRO 100 MG PO CAPS: 100.0000 mg | ORAL_CAPSULE | Freq: Two times a day (BID) | ORAL | Status: DC

## 2015-05-13 NOTE — Patient Instructions (Signed)
1. START TAKING VITAMIN D 1000 IU DAILY.  Urinary Tract Infection Urinary tract infections (UTIs) can develop anywhere along your urinary tract. Your urinary tract is your body's drainage system for removing wastes and extra water. Your urinary tract includes two kidneys, two ureters, a bladder, and a urethra. Your kidneys are a pair of bean-shaped organs. Each kidney is about the size of your fist. They are located below your ribs, one on each side of your spine. CAUSES Infections are caused by microbes, which are microscopic organisms, including fungi, viruses, and bacteria. These organisms are so small that they can only be seen through a microscope. Bacteria are the microbes that most commonly cause UTIs. SYMPTOMS  Symptoms of UTIs may vary by age and gender of the patient and by the location of the infection. Symptoms in young women typically include a frequent and intense urge to urinate and a painful, burning feeling in the bladder or urethra during urination. Older women and men are more likely to be tired, shaky, and weak and have muscle aches and abdominal pain. A fever may mean the infection is in your kidneys. Other symptoms of a kidney infection include pain in your back or sides below the ribs, nausea, and vomiting. DIAGNOSIS To diagnose a UTI, your caregiver will ask you about your symptoms. Your caregiver will also ask you to provide a urine sample. The urine sample will be tested for bacteria and white blood cells. White blood cells are made by your body to help fight infection. TREATMENT  Typically, UTIs can be treated with medication. Because most UTIs are caused by a bacterial infection, they usually can be treated with the use of antibiotics. The choice of antibiotic and length of treatment depend on your symptoms and the type of bacteria causing your infection. HOME CARE INSTRUCTIONS  If you were prescribed antibiotics, take them exactly as your caregiver instructs you. Finish the  medication even if you feel better after you have only taken some of the medication.  Drink enough water and fluids to keep your urine clear or pale yellow.  Avoid caffeine, tea, and carbonated beverages. They tend to irritate your bladder.  Empty your bladder often. Avoid holding urine for long periods of time.  Empty your bladder before and after sexual intercourse.  After a bowel movement, women should cleanse from front to back. Use each tissue only once. SEEK MEDICAL CARE IF:   You have back pain.  You develop a fever.  Your symptoms do not begin to resolve within 3 days. SEEK IMMEDIATE MEDICAL CARE IF:   You have severe back pain or lower abdominal pain.  You develop chills.  You have nausea or vomiting.  You have continued burning or discomfort with urination. MAKE SURE YOU:   Understand these instructions.  Will watch your condition.  Will get help right away if you are not doing well or get worse.   This information is not intended to replace advice given to you by your health care provider. Make sure you discuss any questions you have with your health care provider.   Document Released: 03/18/2005 Document Revised: 02/27/2015 Document Reviewed: 07/17/2011 Elsevier Interactive Patient Education [employer].
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
## 2015-05-14 LAB — URINE CULTURE
Colony Count: NO GROWTH
Organism ID, Bacteria: NO GROWTH

## 2015-05-20 ENCOUNTER — Telehealth: Payer: Self-pay

## 2015-05-20 NOTE — Telephone Encounter (Signed)
Pt states she was given an antibiotic for a UTI and it is about cleared up but not quite, would like to have another round called in. Please call pt at [place]

## 2015-05-21 NOTE — Telephone Encounter (Signed)
Spoke with pt, she is still having urinary urgency but is unable to urinate when she goes to the bathroom. The medication helped some but she feels it did not clear it up completely. Please advise.

## 2015-05-21 NOTE — Telephone Encounter (Signed)
Left message for pt to call back.   Ask what symptoms she is still having?

## 2015-05-22 ENCOUNTER — Ambulatory Visit: Payer: Self-pay | Admitting: Family Medicine

## 2015-05-23 MED ORDER — CIPROFLOXACIN HCL 500 MG PO TABS: 500.0000 mg | ORAL_TABLET | Freq: Two times a day (BID) | ORAL | Status: DC

## 2015-05-23 NOTE — Telephone Encounter (Signed)
Call --- urine culture was negative for urinary tract infection; however, initial urine did suggest a urinary tract infection.  I have sent in Cipro; if symptoms persist after this round of antibiotics, then a urinary tract infection is not the cause of symptoms.

## 2015-05-23 NOTE — Telephone Encounter (Signed)
Left voicemail letting pt know.

## 2015-05-24 ENCOUNTER — Ambulatory Visit: Payer: Self-pay | Admitting: Family Medicine

## 2015-07-08 ENCOUNTER — Other Ambulatory Visit: Payer: Self-pay | Admitting: Family Medicine

## 2015-09-13 ENCOUNTER — Ambulatory Visit: Payer: BLUE CROSS/BLUE SHIELD | Admitting: Family Medicine

## 2015-09-24 ENCOUNTER — Ambulatory Visit: Payer: BLUE CROSS/BLUE SHIELD | Admitting: Family Medicine

## 2015-09-27 ENCOUNTER — Ambulatory Visit (INDEPENDENT_AMBULATORY_CARE_PROVIDER_SITE_OTHER): Payer: BLUE CROSS/BLUE SHIELD | Admitting: Family Medicine

## 2015-09-27 ENCOUNTER — Ambulatory Visit (INDEPENDENT_AMBULATORY_CARE_PROVIDER_SITE_OTHER): Payer: BLUE CROSS/BLUE SHIELD

## 2015-09-27 VITALS — BP 124/70 | HR 97 | Temp 98.1°F | Resp 16 | Ht 62.0 in | Wt 186.0 lb

## 2015-09-27 DIAGNOSIS — R252 Cramp and spasm: Secondary | ICD-10-CM

## 2015-09-27 DIAGNOSIS — M545 Low back pain, unspecified: Secondary | ICD-10-CM

## 2015-09-27 DIAGNOSIS — I10 Essential (primary) hypertension: Secondary | ICD-10-CM

## 2015-09-27 DIAGNOSIS — R55 Syncope and collapse: Secondary | ICD-10-CM | POA: Diagnosis not present

## 2015-09-27 DIAGNOSIS — F32A Depression, unspecified: Secondary | ICD-10-CM

## 2015-09-27 DIAGNOSIS — F418 Other specified anxiety disorders: Secondary | ICD-10-CM | POA: Diagnosis not present

## 2015-09-27 DIAGNOSIS — K219 Gastro-esophageal reflux disease without esophagitis: Secondary | ICD-10-CM | POA: Diagnosis not present

## 2015-09-27 DIAGNOSIS — F419 Anxiety disorder, unspecified: Secondary | ICD-10-CM

## 2015-09-27 DIAGNOSIS — M47816 Spondylosis without myelopathy or radiculopathy, lumbar region: Secondary | ICD-10-CM | POA: Diagnosis not present

## 2015-09-27 DIAGNOSIS — R197 Diarrhea, unspecified: Secondary | ICD-10-CM

## 2015-09-27 DIAGNOSIS — A09 Infectious gastroenteritis and colitis, unspecified: Secondary | ICD-10-CM

## 2015-09-27 DIAGNOSIS — F329 Major depressive disorder, single episode, unspecified: Secondary | ICD-10-CM

## 2015-09-27 LAB — POCT CBC
Granulocyte percent: 68.8 %G (ref 37–80)
HCT, POC: 42 % (ref 37.7–47.9)
Hemoglobin: 14.7 g/dL (ref 12.2–16.2)
Lymph, poc: 1.3 (ref 0.6–3.4)
MCH, POC: 31.5 pg — AB (ref 27–31.2)
MCHC: 35.1 g/dL (ref 31.8–35.4)
MCV: 89.8 fL (ref 80–97)
MID (cbc): 0.4 (ref 0–0.9)
MPV: 7.9 fL (ref 0–99.8)
POC Granulocyte: 3.8 (ref 2–6.9)
POC LYMPH PERCENT: 24.4 %L (ref 10–50)
POC MID %: 6.8 %M (ref 0–12)
Platelet Count, POC: 240 10*3/uL (ref 142–424)
RBC: 4.68 M/uL (ref 4.04–5.48)
RDW, POC: 14 %
WBC: 5.6 10*3/uL (ref 4.6–10.2)

## 2015-09-27 LAB — POCT URINALYSIS DIP (MANUAL ENTRY)
Bilirubin, UA: NEGATIVE
Blood, UA: NEGATIVE
Glucose, UA: NEGATIVE
Ketones, POC UA: NEGATIVE
Leukocytes, UA: NEGATIVE
Nitrite, UA: NEGATIVE
Protein Ur, POC: NEGATIVE
Spec Grav, UA: 1.015
Urobilinogen, UA: 0.2
pH, UA: 8.5

## 2015-09-27 LAB — COMPREHENSIVE METABOLIC PANEL
ALT: 57 U/L — ABNORMAL HIGH (ref 6–29)
AST: 35 U/L (ref 10–35)
Albumin: 4.1 g/dL (ref 3.6–5.1)
Alkaline Phosphatase: 74 U/L (ref 33–130)
BUN: 7 mg/dL (ref 7–25)
CO2: 27 mmol/L (ref 20–31)
Calcium: 9.2 mg/dL (ref 8.6–10.4)
Chloride: 100 mmol/L (ref 98–110)
Creat: 0.93 mg/dL (ref 0.50–1.05)
Glucose, Bld: 96 mg/dL (ref 65–99)
Potassium: 4.2 mmol/L (ref 3.5–5.3)
Sodium: 139 mmol/L (ref 135–146)
Total Bilirubin: 0.4 mg/dL (ref 0.2–1.2)
Total Protein: 7.3 g/dL (ref 6.1–8.1)

## 2015-09-27 LAB — POC MICROSCOPIC URINALYSIS (UMFC): Mucus: ABSENT

## 2015-09-27 LAB — VITAMIN B12: Vitamin B-12: 387 pg/mL (ref 200–1100)

## 2015-09-27 LAB — TSH: TSH: 1.34 mIU/L

## 2015-09-27 LAB — GLUCOSE, POCT (MANUAL RESULT ENTRY): POC Glucose: 103 mg/dl — AB (ref 70–99)

## 2015-09-27 LAB — IRON: Iron: 49 ug/dL (ref 45–160)

## 2015-09-27 MED ORDER — DIAZEPAM 5 MG PO TABS: 2.5000 mg | ORAL_TABLET | Freq: Two times a day (BID) | ORAL | Status: DC | PRN
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
## 2015-09-27 NOTE — Patient Instructions (Signed)
Low Back Sprain With Rehab A sprain is an injury in which a ligament is torn. The ligaments of the lower back are vulnerable to sprains. However, they are strong and require great force to be injured. These ligaments are important for stabilizing the spinal column. Sprains are classified into three categories. Grade 1 sprains cause pain, but the tendon is not lengthened. Grade 2 sprains include a lengthened ligament, due to the ligament being stretched or partially ruptured. With grade 2 sprains there is still function, although the function may be decreased. Grade 3 sprains involve a complete tear of the tendon or muscle, and function is usually impaired. SYMPTOMS   Severe pain in the lower back.  Sometimes, a feeling of a "pop," "snap," or tear, at the time of injury.  Tenderness and sometimes swelling at the injury site.  Uncommonly, bruising (contusion) within 48 hours of injury.  Muscle spasms in the back. CAUSES  Low back sprains occur when a force is placed on the ligaments that is greater than they can handle. Common causes of injury include:  Performing a stressful act while off-balance.  Repetitive stressful activities that involve movement of the lower back.  Direct hit (trauma) to the lower back. RISK INCREASES WITH:  Contact sports (football, wrestling).  Collisions (major skiing accidents).  Sports that require throwing or lifting (baseball, weightlifting).  Sports involving twisting of the spine (gymnastics, diving, tennis, golf).  Poor strength and flexibility.  Inadequate protection.  Previous back injury or surgery (especially fusion). PREVENTION  Wear properly fitted and padded protective equipment.  Warm up and stretch properly before activity.  Allow for adequate recovery between workouts.  Maintain physical fitness:  Strength, flexibility, and endurance.  Cardiovascular fitness.  Maintain a healthy body weight. PROGNOSIS  If treated properly,  low back sprains usually heal with non-surgical treatment. The length of time for healing depends on the severity of the injury.  RELATED COMPLICATIONS   Recurring symptoms, resulting in a chronic problem.  Chronic inflammation and pain in the low back.  Delayed healing or resolution of symptoms, especially if activity is resumed too soon.  Prolonged impairment.  Unstable or arthritic joints of the low back. TREATMENT  Treatment first involves the use of ice and medicine, to reduce pain and inflammation. The use of strengthening and stretching exercises may help reduce pain with activity. These exercises may be performed at home or with a therapist. Severe injuries may require referral to a therapist for further evaluation and treatment, such as ultrasound. Your caregiver may advise that you wear a back brace or corset, to help reduce pain and discomfort. Often, prolonged bed rest results in greater harm then benefit. Corticosteroid injections may be recommended. However, these should be reserved for the most serious cases. It is important to avoid using your back when lifting objects. At night, sleep on your back on a firm mattress, with a pillow placed under your knees. If non-surgical treatment is unsuccessful, surgery may be needed.  MEDICATION   If pain medicine is needed, nonsteroidal anti-inflammatory medicines (aspirin and ibuprofen), or other minor pain relievers (acetaminophen), are often advised.  Do not take pain medicine for 7 days before surgery.  Prescription pain relievers may be given, if your caregiver thinks they are needed. Use only as directed and only as much as you need.  Ointments applied to the skin may be helpful.  Corticosteroid injections may be given by your caregiver. These injections should be reserved for the most serious cases, because   they may only be given a certain number of times. HEAT AND COLD  Cold treatment (icing) should be applied for 10 to 15  minutes every 2 to 3 hours for inflammation and pain, and immediately after activity that aggravates your symptoms. Use ice packs or an ice massage.  Heat treatment may be used before performing stretching and strengthening activities prescribed by your caregiver, physical therapist, or athletic trainer. Use a heat pack or a warm water soak. SEEK MEDICAL CARE IF:   Symptoms get worse or do not improve in 2 to 4 weeks, despite treatment.  You develop numbness or weakness in either leg.  You lose bowel or bladder function.  Any of the following occur after surgery: fever, increased pain, swelling, redness, drainage of fluids, or bleeding in the affected area.  New, unexplained symptoms develop. (Drugs used in treatment may produce side effects.) EXERCISES  RANGE OF MOTION (ROM) AND STRETCHING EXERCISES - Low Back Sprain Most people with lower back pain will find that their symptoms get worse with excessive bending forward (flexion) or arching at the lower back (extension). The exercises that will help resolve your symptoms will focus on the opposite motion.  Your physician, physical therapist or athletic trainer will help you determine which exercises will be most helpful to resolve your lower back pain. Do not complete any exercises without first consulting with your caregiver. Discontinue any exercises which make your symptoms worse, until you speak to your caregiver. If you have pain, numbness or tingling which travels down into your buttocks, leg or foot, the goal of the therapy is for these symptoms to move closer to your back and eventually resolve. Sometimes, these leg symptoms will get better, but your lower back pain may worsen. This is often an indication of progress in your rehabilitation. Be very alert to any changes in your symptoms and the activities in which you participated in the 24 hours prior to the change. Sharing this information with your caregiver will allow him or her to most  efficiently treat your condition. These exercises may help you when beginning to rehabilitate your injury. Your symptoms may resolve with or without further involvement from your physician, physical therapist or athletic trainer. While completing these exercises, remember:   Restoring tissue flexibility helps normal motion to return to the joints. This allows healthier, less painful movement and activity.  An effective stretch should be held for at least 30 seconds.  A stretch should never be painful. You should only feel a gentle lengthening or release in the stretched tissue. FLEXION RANGE OF MOTION AND STRETCHING EXERCISES: STRETCH - Flexion, Single Knee to Chest   Lie on a firm bed or floor with both legs extended in front of you.  Keeping one leg in contact with the floor, bring your opposite knee to your chest. Hold your leg in place by either grabbing behind your thigh or at your knee.  Pull until you feel a gentle stretch in your low back. Hold __________ seconds.  Slowly release your grasp and repeat the exercise with the opposite side. Repeat __________ times. Complete this exercise __________ times per day.  STRETCH - Flexion, Double Knee to Chest  Lie on a firm bed or floor with both legs extended in front of you.  Keeping one leg in contact with the floor, bring your opposite knee to your chest.  Tense your stomach muscles to support your back and then lift your other knee to your chest. Hold your legs in   place by either grabbing behind your thighs or at your knees.  Pull both knees toward your chest until you feel a gentle stretch in your low back. Hold __________ seconds.  Tense your stomach muscles and slowly return one leg at a time to the floor. Repeat __________ times. Complete this exercise __________ times per day.  STRETCH - Low Trunk Rotation  Lie on a firm bed or floor. Keeping your legs in front of you, bend your knees so they are both pointed toward the  ceiling and your feet are flat on the floor.  Extend your arms out to the side. This will stabilize your upper body by keeping your shoulders in contact with the floor.  Gently and slowly drop both knees together to one side until you feel a gentle stretch in your low back. Hold for __________ seconds.  Tense your stomach muscles to support your lower back as you bring your knees back to the starting position. Repeat the exercise to the other side. Repeat __________ times. Complete this exercise __________ times per day  EXTENSION RANGE OF MOTION AND FLEXIBILITY EXERCISES: STRETCH - Extension, Prone on Elbows   Lie on your stomach on the floor, a bed will be too soft. Place your palms about shoulder width apart and at the height of your head.  Place your elbows under your shoulders. If this is too painful, stack pillows under your chest.  Allow your body to relax so that your hips drop lower and make contact more completely with the floor.  Hold this position for __________ seconds.  Slowly return to lying flat on the floor. Repeat __________ times. Complete this exercise __________ times per day.  RANGE OF MOTION - Extension, Prone Press Ups  Lie on your stomach on the floor, a bed will be too soft. Place your palms about shoulder width apart and at the height of your head.  Keeping your back as relaxed as possible, slowly straighten your elbows while keeping your hips on the floor. You may adjust the placement of your hands to maximize your comfort. As you gain motion, your hands will come more underneath your shoulders.  Hold this position __________ seconds.  Slowly return to lying flat on the floor. Repeat __________ times. Complete this exercise __________ times per day.  RANGE OF MOTION- Quadruped, Neutral Spine   Assume a hands and knees position on a firm surface. Keep your hands under your shoulders and your knees under your hips. You may place padding under your knees for  comfort.  Drop your head and point your tailbone toward the ground below you. This will round out your lower back like an angry cat. Hold this position for __________ seconds.  Slowly lift your head and release your tail bone so that your back sags into a large arch, like an old horse.  Hold this position for __________ seconds.  Repeat this until you feel limber in your low back.  Now, find your "sweet spot." This will be the most comfortable position somewhere between the two previous positions. This is your neutral spine. Once you have found this position, tense your stomach muscles to support your low back.  Hold this position for __________ seconds. Repeat __________ times. Complete this exercise __________ times per day.  STRENGTHENING EXERCISES - Low Back Sprain These exercises may help you when beginning to rehabilitate your injury. These exercises should be done near your "sweet spot." This is the neutral, low-back arch, somewhere between fully rounded and   fully arched, that is your least painful position. When performed in this safe range of motion, these exercises can be used for people who have either a flexion or extension based injury. These exercises may resolve your symptoms with or without further involvement from your physician, physical therapist or athletic trainer. While completing these exercises, remember:   Muscles can gain both the endurance and the strength needed for everyday activities through controlled exercises.  Complete these exercises as instructed by your physician, physical therapist or athletic trainer. Increase the resistance and repetitions only as guided.  You may experience muscle soreness or fatigue, but the pain or discomfort you are trying to eliminate should never worsen during these exercises. If this pain does worsen, stop and make certain you are following the directions exactly. If the pain is still present after adjustments, discontinue the  exercise until you can discuss the trouble with your caregiver. STRENGTHENING - Deep Abdominals, Pelvic Tilt   Lie on a firm bed or floor. Keeping your legs in front of you, bend your knees so they are both pointed toward the ceiling and your feet are flat on the floor.  Tense your lower abdominal muscles to press your low back into the floor. This motion will rotate your pelvis so that your tail bone is scooping upwards rather than pointing at your feet or into the floor. With a gentle tension and even breathing, hold this position for __________ seconds. Repeat __________ times. Complete this exercise __________ times per day.  STRENGTHENING - Abdominals, Crunches   Lie on a firm bed or floor. Keeping your legs in front of you, bend your knees so they are both pointed toward the ceiling and your feet are flat on the floor. Cross your arms over your chest.  Slightly tip your chin down without bending your neck.  Tense your abdominals and slowly lift your trunk high enough to just clear your shoulder blades. Lifting higher can put excessive stress on the lower back and does not further strengthen your abdominal muscles.  Control your return to the starting position. Repeat __________ times. Complete this exercise __________ times per day.  STRENGTHENING - Quadruped, Opposite UE/LE Lift   Assume a hands and knees position on a firm surface. Keep your hands under your shoulders and your knees under your hips. You may place padding under your knees for comfort.  Find your neutral spine and gently tense your abdominal muscles so that you can maintain this position. Your shoulders and hips should form a rectangle that is parallel with the floor and is not twisted.  Keeping your trunk steady, lift your right hand no higher than your shoulder and then your left leg no higher than your hip. Make sure you are not holding your breath. Hold this position for __________ seconds.  Continuing to keep  your abdominal muscles tense and your back steady, slowly return to your starting position. Repeat with the opposite arm and leg. Repeat __________ times. Complete this exercise __________ times per day.  STRENGTHENING - Abdominals and Quadriceps, Straight Leg Raise   Lie on a firm bed or floor with both legs extended in front of you.  Keeping one leg in contact with the floor, bend the other knee so that your foot can rest flat on the floor.  Find your neutral spine, and tense your abdominal muscles to maintain your spinal position throughout the exercise.  Slowly lift your straight leg off the floor about 6 inches for a count of   15, making sure to not hold your breath.  Still keeping your neutral spine, slowly lower your leg all the way to the floor. Repeat this exercise with each leg __________ times. Complete this exercise __________ times per day. POSTURE AND BODY MECHANICS CONSIDERATIONS - Low Back Sprain Keeping correct posture when sitting, standing or completing your activities will reduce the stress put on different body tissues, allowing injured tissues a chance to heal and limiting painful experiences. The following are general guidelines for improved posture. Your physician or physical therapist will provide you with any instructions specific to your needs. While reading these guidelines, remember:  The exercises prescribed by your provider will help you have the flexibility and strength to maintain correct postures.  The correct posture provides the best environment for your joints to work. All of your joints have less wear and tear when properly supported by a spine with good posture. This means you will experience a healthier, less painful body.  Correct posture must be practiced with all of your activities, especially prolonged sitting and standing. Correct posture is as important when doing repetitive low-stress activities (typing) as it is when doing a single heavy-load  activity (lifting). RESTING POSITIONS Consider which positions are most painful for you when choosing a resting position. If you have pain with flexion-based activities (sitting, bending, stooping, squatting), choose a position that allows you to rest in a less flexed posture. You would want to avoid curling into a fetal position on your side. If your pain worsens with extension-based activities (prolonged standing, working overhead), avoid resting in an extended position such as sleeping on your stomach. Most people will find more comfort when they rest with their spine in a more neutral position, neither too rounded nor too arched. Lying on a non-sagging bed on your side with a pillow between your knees, or on your back with a pillow under your knees will often provide some relief. Keep in mind, being in any one position for a prolonged period of time, no matter how correct your posture, can still lead to stiffness. PROPER SITTING POSTURE In order to minimize stress and discomfort on your spine, you must sit with correct posture. Sitting with good posture should be effortless for a healthy body. Returning to good posture is a gradual process. Many people can work toward this most comfortably by using various supports until they have the flexibility and strength to maintain this posture on their own. When sitting with proper posture, your ears will fall over your shoulders and your shoulders will fall over your hips. You should use the back of the chair to support your upper back. Your lower back will be in a neutral position, just slightly arched. You may place a small pillow or folded towel at the base of your lower back for  support.  When working at a desk, create an environment that supports good, upright posture. Without extra support, muscles tire, which leads to excessive strain on joints and other tissues. Keep these recommendations in mind: CHAIR:  A chair should be able to slide under your desk  when your back makes contact with the back of the chair. This allows you to work closely.  The chair's height should allow your eyes to be level with the upper part of your monitor and your hands to be slightly lower than your elbows. BODY POSITION  Your feet should make contact with the floor. If this is not possible, use a foot rest.  Keep your ears   over your shoulders. This will reduce stress on your neck and low back. INCORRECT SITTING POSTURES  If you are feeling tired and unable to assume a healthy sitting posture, do not slouch or slump. This puts excessive strain on your back tissues, causing more damage and pain. Healthier options include:  Using more support, like a lumbar pillow.  Switching tasks to something that requires you to be upright or walking.  Talking a brief walk.  Lying down to rest in a neutral-spine position. PROLONGED STANDING WHILE SLIGHTLY LEANING FORWARD  When completing a task that requires you to lean forward while standing in one place for a long time, place either foot up on a stationary 2-4 inch high object to help maintain the best posture. When both feet are on the ground, the lower back tends to lose its slight inward curve. If this curve flattens (or becomes too large), then the back and your other joints will experience too much stress, tire more quickly, and can cause pain. CORRECT STANDING POSTURES Proper standing posture should be assumed with all daily activities, even if they only take a few moments, like when brushing your teeth. As in sitting, your ears should fall over your shoulders and your shoulders should fall over your hips. You should keep a slight tension in your abdominal muscles to brace your spine. Your tailbone should point down to the ground, not behind your body, resulting in an over-extended swayback posture.  INCORRECT STANDING POSTURES  Common incorrect standing postures include a forward head, locked knees and/or an excessive  swayback. WALKING Walk with an upright posture. Your ears, shoulders and hips should all line-up. PROLONGED ACTIVITY IN A FLEXED POSITION When completing a task that requires you to bend forward at your waist or lean over a low surface, try to find a way to stabilize 3 out of 4 of your limbs. You can place a hand or elbow on your thigh or rest a knee on the surface you are reaching across. This will provide you more stability, so that your muscles do not tire as quickly. By keeping your knees relaxed, or slightly bent, you will also reduce stress across your lower back. CORRECT LIFTING TECHNIQUES DO :  Assume a wide stance. This will provide you more stability and the opportunity to get as close as possible to the object which you are lifting.  Tense your abdominals to brace your spine. Bend at the knees and hips. Keeping your back locked in a neutral-spine position, lift using your leg muscles. Lift with your legs, keeping your back straight.  Test the weight of unknown objects before attempting to lift them.  Try to keep your elbows locked down at your sides in order get the best strength from your shoulders when carrying an object.  Always ask for help when lifting heavy or awkward objects. INCORRECT LIFTING TECHNIQUES DO NOT:   Lock your knees when lifting, even if it is a small object.  Bend and twist. Pivot at your feet or move your feet when needing to change directions.  Assume that you can safely pick up even a paperclip without proper posture.   This information is not intended to replace advice given to you by your health care provider. Make sure you discuss any questions you have with your health care provider.   Document Released: 06/08/2005 Document Revised: 06/29/2014 Document Reviewed: 09/20/2008 [employer] Interactive Patient Education 2016 [employer]  

## 2015-09-28 LAB — VITAMIN D 25 HYDROXY (VIT D DEFICIENCY, FRACTURES): Vit D, 25-Hydroxy: 12 ng/mL — ABNORMAL LOW (ref 30–100)

## 2015-10-03 ENCOUNTER — Other Ambulatory Visit: Payer: Self-pay | Admitting: Family Medicine

## 2015-10-03 DIAGNOSIS — E559 Vitamin D deficiency, unspecified: Secondary | ICD-10-CM

## 2015-10-03 DIAGNOSIS — K760 Fatty (change of) liver, not elsewhere classified: Secondary | ICD-10-CM

## 2015-10-03 MED ORDER — VITAMIN D (ERGOCALCIFEROL) 1.25 MG (50000 UNIT) PO CAPS: 50000.0000 [IU] | ORAL_CAPSULE | ORAL | Status: DC

## 2015-10-03 NOTE — Addendum Note (Signed)
Addended by: Wardell Honour on: 10/03/2015 02:59 PM   Modules accepted: Orders

## 2016-01-03 ENCOUNTER — Telehealth: Payer: Self-pay

## 2016-01-03 NOTE — Telephone Encounter (Signed)
Pt would like a refill on her acyclovir (ZOVIRAX) 400 MG tablet [id number]. She is having a CPE with Dr. Tamala Julian on 8/8 it was changed from July.       Pharmacy:  [facility] [place] [address]         Please advise at [phone]

## 2016-01-06 ENCOUNTER — Other Ambulatory Visit: Payer: Self-pay

## 2016-01-06 MED ORDER — ACYCLOVIR 400 MG PO TABS: 400.0000 mg | ORAL_TABLET | ORAL | Status: DC

## 2016-01-06 MED ORDER — OMEPRAZOLE 40 MG PO CPDR: 40.0000 mg | DELAYED_RELEASE_CAPSULE | Freq: Every day | ORAL | Status: DC

## 2016-01-06 NOTE — Telephone Encounter (Signed)
Pt. Has an appt with Dr. Tamala Julian on 08/08 but needed a Rx refill for her acyclovir 400mg . Pt. Was given enough to help cover her until her appt.

## 2016-01-06 NOTE — Telephone Encounter (Signed)
Patient also stated she needed a rx on the omeprazole 40mg . Rx sent in to [facility] on [address] here in [place].

## 2016-01-06 NOTE — Telephone Encounter (Signed)
Rx refills for acyclovir 400mg  and omeprazole 40mg  went sent in for the patient.

## 2016-01-28 ENCOUNTER — Encounter: Payer: Self-pay | Admitting: Family Medicine

## 2016-01-28 ENCOUNTER — Ambulatory Visit (INDEPENDENT_AMBULATORY_CARE_PROVIDER_SITE_OTHER): Payer: BLUE CROSS/BLUE SHIELD | Admitting: Family Medicine

## 2016-01-28 VITALS — BP 140/80 | HR 76 | Temp 98.9°F | Resp 18 | Ht 62.0 in | Wt 189.0 lb

## 2016-01-28 DIAGNOSIS — I10 Essential (primary) hypertension: Secondary | ICD-10-CM | POA: Diagnosis not present

## 2016-01-28 DIAGNOSIS — A609 Anogenital herpesviral infection, unspecified: Secondary | ICD-10-CM

## 2016-01-28 DIAGNOSIS — Z131 Encounter for screening for diabetes mellitus: Secondary | ICD-10-CM

## 2016-01-28 DIAGNOSIS — R251 Tremor, unspecified: Secondary | ICD-10-CM | POA: Diagnosis not present

## 2016-01-28 DIAGNOSIS — M1990 Unspecified osteoarthritis, unspecified site: Secondary | ICD-10-CM | POA: Diagnosis not present

## 2016-01-28 DIAGNOSIS — S161XXA Strain of muscle, fascia and tendon at neck level, initial encounter: Secondary | ICD-10-CM

## 2016-01-28 DIAGNOSIS — E559 Vitamin D deficiency, unspecified: Secondary | ICD-10-CM

## 2016-01-28 DIAGNOSIS — K219 Gastro-esophageal reflux disease without esophagitis: Secondary | ICD-10-CM

## 2016-01-28 DIAGNOSIS — Z13220 Encounter for screening for lipoid disorders: Secondary | ICD-10-CM

## 2016-01-28 DIAGNOSIS — E66812 Obesity, class 2: Secondary | ICD-10-CM

## 2016-01-28 DIAGNOSIS — F418 Other specified anxiety disorders: Secondary | ICD-10-CM

## 2016-01-28 DIAGNOSIS — Z0000 Encounter for general adult medical examination without abnormal findings: Secondary | ICD-10-CM | POA: Diagnosis not present

## 2016-01-28 DIAGNOSIS — F329 Major depressive disorder, single episode, unspecified: Secondary | ICD-10-CM

## 2016-01-28 DIAGNOSIS — Z1231 Encounter for screening mammogram for malignant neoplasm of breast: Secondary | ICD-10-CM | POA: Diagnosis not present

## 2016-01-28 DIAGNOSIS — A6000 Herpesviral infection of urogenital system, unspecified: Secondary | ICD-10-CM

## 2016-01-28 DIAGNOSIS — K760 Fatty (change of) liver, not elsewhere classified: Secondary | ICD-10-CM

## 2016-01-28 DIAGNOSIS — F419 Anxiety disorder, unspecified: Secondary | ICD-10-CM

## 2016-01-28 DIAGNOSIS — F32A Depression, unspecified: Secondary | ICD-10-CM

## 2016-01-28 DIAGNOSIS — E669 Obesity, unspecified: Secondary | ICD-10-CM | POA: Diagnosis not present

## 2016-01-28 LAB — POCT URINALYSIS DIP (MANUAL ENTRY)
Bilirubin, UA: NEGATIVE
Blood, UA: NEGATIVE
Glucose, UA: NEGATIVE
Ketones, POC UA: NEGATIVE
Leukocytes, UA: NEGATIVE
Nitrite, UA: NEGATIVE
Protein Ur, POC: NEGATIVE
Spec Grav, UA: 1.015
Urobilinogen, UA: 0.2
pH, UA: 7

## 2016-01-28 LAB — COMPREHENSIVE METABOLIC PANEL
ALT: 44 U/L — ABNORMAL HIGH (ref 6–29)
AST: 24 U/L (ref 10–35)
Albumin: 4.3 g/dL (ref 3.6–5.1)
Alkaline Phosphatase: 74 U/L (ref 33–130)
BUN: 13 mg/dL (ref 7–25)
CO2: 26 mmol/L (ref 20–31)
Calcium: 9.1 mg/dL (ref 8.6–10.4)
Chloride: 102 mmol/L (ref 98–110)
Creat: 0.95 mg/dL (ref 0.50–1.05)
Glucose, Bld: 106 mg/dL — ABNORMAL HIGH (ref 65–99)
Potassium: 3.9 mmol/L (ref 3.5–5.3)
Sodium: 136 mmol/L (ref 135–146)
Total Bilirubin: 0.5 mg/dL (ref 0.2–1.2)
Total Protein: 6.9 g/dL (ref 6.1–8.1)

## 2016-01-28 LAB — CBC WITH DIFFERENTIAL/PLATELET
Basophils Absolute: 0 cells/uL (ref 0–200)
Basophils Relative: 0 %
Eosinophils Absolute: 62 cells/uL (ref 15–500)
Eosinophils Relative: 1 %
HCT: 40.5 % (ref 35.0–45.0)
Hemoglobin: 13.4 g/dL (ref 11.7–15.5)
Lymphocytes Relative: 20 %
Lymphs Abs: 1240 cells/uL (ref 850–3900)
MCH: 29.7 pg (ref 27.0–33.0)
MCHC: 33.1 g/dL (ref 32.0–36.0)
MCV: 89.8 fL (ref 80.0–100.0)
MPV: 10.1 fL (ref 7.5–12.5)
Monocytes Absolute: 496 cells/uL (ref 200–950)
Monocytes Relative: 8 %
Neutro Abs: 4402 cells/uL (ref 1500–7800)
Neutrophils Relative %: 71 %
Platelets: 297 10*3/uL (ref 140–400)
RBC: 4.51 MIL/uL (ref 3.80–5.10)
RDW: 14 % (ref 11.0–15.0)
WBC: 6.2 10*3/uL (ref 3.8–10.8)

## 2016-01-28 LAB — LIPID PANEL
Cholesterol: 215 mg/dL — ABNORMAL HIGH (ref 125–200)
HDL: 69 mg/dL (ref >=46)
LDL Cholesterol: 126 mg/dL (ref <130)
Total CHOL/HDL Ratio: 3.1 Ratio (ref <=5.0)
Triglycerides: 102 mg/dL (ref <150)
VLDL: 20 mg/dL (ref <30)

## 2016-01-28 LAB — HEMOGLOBIN A1C
Hgb A1c MFr Bld: 5.3 % (ref <5.7)
Mean Plasma Glucose: 105 mg/dL

## 2016-01-28 LAB — POC MICROSCOPIC URINALYSIS (UMFC): Mucus: ABSENT

## 2016-01-28 LAB — VITAMIN D 25 HYDROXY (VIT D DEFICIENCY, FRACTURES): Vit D, 25-Hydroxy: 24 ng/mL — ABNORMAL LOW (ref 30–100)

## 2016-01-28 LAB — VITAMIN B12: Vitamin B-12: 303 pg/mL (ref 200–1100)

## 2016-01-28 LAB — TSH: TSH: 1.12 mIU/L

## 2016-01-28 MED ORDER — MELOXICAM 7.5 MG PO TABS: 7.5000 mg | ORAL_TABLET | Freq: Every day | ORAL | 2 refills | Status: DC | PRN

## 2016-01-28 MED ORDER — BUPROPION HCL ER (XL) 300 MG PO TB24: 300.0000 mg | ORAL_TABLET | Freq: Every day | ORAL | 3 refills | Status: DC

## 2016-01-28 MED ORDER — ESCITALOPRAM OXALATE 10 MG PO TABS: 10.0000 mg | ORAL_TABLET | Freq: Every day | ORAL | 3 refills | Status: DC

## 2016-01-28 MED ORDER — OMEPRAZOLE 20 MG PO CPDR: 20.0000 mg | DELAYED_RELEASE_CAPSULE | Freq: Every day | ORAL | 3 refills | Status: DC

## 2016-01-28 MED ORDER — METHOCARBAMOL 750 MG PO TABS: 750.0000 mg | ORAL_TABLET | Freq: Three times a day (TID) | ORAL | 0 refills | Status: DC | PRN

## 2016-01-28 MED ORDER — ACYCLOVIR 400 MG PO TABS: 400.0000 mg | ORAL_TABLET | ORAL | 5 refills | Status: DC

## 2016-01-28 MED ORDER — HYDROCHLOROTHIAZIDE 12.5 MG PO CAPS: 12.5000 mg | ORAL_CAPSULE | Freq: Every day | ORAL | 3 refills | Status: DC

## 2016-01-28 MED ORDER — DIAZEPAM 5 MG PO TABS: 2.5000 mg | ORAL_TABLET | Freq: Every day | ORAL | 0 refills | Status: DC | PRN

## 2016-01-28 NOTE — Patient Instructions (Addendum)
   IF you received an x-ray today, you will receive an invoice from [facility]. Please contact [facility] at [phone] with questions or concerns regarding your invoice.   IF you received labwork today, you will receive an invoice from [facility]. Please contact [facility] at [phone] with questions or concerns regarding your invoice.   Our billing staff will not be able to assist you with questions regarding bills from these companies.  You will be contacted with the lab results as soon as they are available. The fastest way to get your results is to activate your My Chart account. Instructions are located on the last page of this paperwork. If you have not heard from us regarding the results in 2 weeks, please contact this office.    Keeping You Healthy  Get These Tests  Blood Pressure- Have your blood pressure checked by your healthcare provider at least once a year.  Normal blood pressure is 120/80.  Weight- Have your body mass index (BMI) calculated to screen for obesity.  BMI is a measure of body fat based on height and weight.  You can calculate your own BMI at www.nhlbisupport.com/bmi/  Cholesterol- Have your cholesterol checked every year.  Diabetes- Have your blood sugar checked every year if you have high blood pressure, high cholesterol, a family history of diabetes or if you are overweight.  Pap Test - Have a pap test every 1 to 5 years if you have been sexually active.  If you are older than 65 and recent pap tests have been normal you may not need additional pap tests.  In addition, if you have had a hysterectomy  for benign disease additional pap tests are not necessary.  Mammogram-Yearly mammograms are essential for early detection of breast cancer  Screening for Colon Cancer- Colonoscopy starting at age 50. Screening may begin sooner depending on your family history and other health conditions.  Follow up colonoscopy  as directed by your Gastroenterologist.  Screening for Osteoporosis- Screening begins at age 65 with bone density scanning, sooner if you are at higher risk for developing Osteoporosis.  Get these medicines  Calcium with Vitamin D- Your body requires 1200-1500 mg of Calcium a day and 800-1000 IU of Vitamin D a day.  You can only absorb 500 mg of Calcium at a time therefore Calcium must be taken in 2 or 3 separate doses throughout the day.  Hormones- Hormone therapy has been associated with increased risk for certain cancers and heart disease.  Talk to your healthcare provider about if you need relief from menopausal symptoms.  Aspirin- Ask your healthcare provider about taking Aspirin to prevent Heart Disease and Stroke.  Get these Immuniztions  Flu shot- Every fall  Pneumonia shot- Once after the age of 65; if you are younger ask your healthcare provider if you need a pneumonia shot.  Tetanus- Every ten years.  Zostavax- Once after the age of 60 to prevent shingles.  Take these steps  Don't smoke- Your healthcare provider can help you quit. For tips on how to quit, ask your healthcare provider or go to www.smokefree.gov or call 1-800 QUIT-NOW.  Be physically active- Exercise 5 days a week for a minimum of 30 minutes.  If you are not already physically active, start slow and gradually work up to 30 minutes of moderate physical activity.  Try walking, dancing, bike riding, swimming, etc.  Eat a healthy diet- Eat a variety of healthy foods such as fruits, vegetables, whole   grains, low fat milk, low fat cheeses, yogurt, lean meats, chicken, fish, eggs, dried beans, tofu, etc.  For more information go to www.thenutritionsource.org  Dental visit- Brush and floss teeth twice daily; visit your dentist twice a year.  Eye exam- Visit your Optometrist or Ophthalmologist yearly.  Drink alcohol in moderation- Limit alcohol intake to one drink or less a day.  Never drink and  drive.  Depression- Your emotional health is as important as your physical health.  If you're feeling down or losing interest in things you normally enjoy, please talk to your healthcare provider.  Seat Belts- can save your life; always wear one  Smoke/Carbon Monoxide detectors- These detectors need to be installed on the appropriate level of your home.  Replace batteries at least once a year.  Violence- If anyone is threatening or hurting you, please tell your healthcare provider.  Living Will/ Health care power of attorney- Discuss with your healthcare provider and family. 
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
## 2016-02-14 ENCOUNTER — Encounter: Payer: Self-pay | Admitting: Neurology

## 2016-02-14 ENCOUNTER — Ambulatory Visit (INDEPENDENT_AMBULATORY_CARE_PROVIDER_SITE_OTHER): Payer: BLUE CROSS/BLUE SHIELD | Admitting: Neurology

## 2016-02-14 VITALS — BP 148/80 | HR 70 | Ht 62.0 in | Wt 191.0 lb

## 2016-02-14 DIAGNOSIS — E538 Deficiency of other specified B group vitamins: Secondary | ICD-10-CM | POA: Diagnosis not present

## 2016-02-14 DIAGNOSIS — I10 Essential (primary) hypertension: Secondary | ICD-10-CM | POA: Diagnosis not present

## 2016-02-14 DIAGNOSIS — H21563 Pupillary abnormality, bilateral: Secondary | ICD-10-CM

## 2016-02-14 DIAGNOSIS — R251 Tremor, unspecified: Secondary | ICD-10-CM
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
## 2016-02-14 NOTE — Patient Instructions (Signed)
1. We have sent a referral to [facility] for your CT and they will call you directly to schedule your appt. They are located at [place]. If you need to contact them directly please call [phone].  2. Start B12 supplements - 1000 mcg daily.

## 2016-02-26 ENCOUNTER — Ambulatory Visit (INDEPENDENT_AMBULATORY_CARE_PROVIDER_SITE_OTHER): Payer: BLUE CROSS/BLUE SHIELD | Admitting: Family Medicine

## 2016-02-26 VITALS — BP 126/80 | HR 74 | Temp 98.6°F | Resp 18 | Ht 62.0 in | Wt 194.0 lb

## 2016-02-26 DIAGNOSIS — B9689 Other specified bacterial agents as the cause of diseases classified elsewhere: Secondary | ICD-10-CM | POA: Diagnosis not present

## 2016-02-26 DIAGNOSIS — N898 Other specified noninflammatory disorders of vagina: Secondary | ICD-10-CM | POA: Diagnosis not present

## 2016-02-26 DIAGNOSIS — N760 Acute vaginitis: Secondary | ICD-10-CM | POA: Diagnosis not present

## 2016-02-26 DIAGNOSIS — R339 Retention of urine, unspecified: Secondary | ICD-10-CM

## 2016-02-26 LAB — POCT URINALYSIS DIP (MANUAL ENTRY)
Bilirubin, UA: NEGATIVE
Glucose, UA: 100 — AB
Ketones, POC UA: NEGATIVE
Nitrite, UA: POSITIVE — AB
Protein Ur, POC: NEGATIVE
Spec Grav, UA: 1.015
Urobilinogen, UA: 1
pH, UA: 7

## 2016-02-26 LAB — POCT WET + KOH PREP: Trich by wet prep: ABSENT

## 2016-02-26 LAB — POC MICROSCOPIC URINALYSIS (UMFC): Mucus: ABSENT

## 2016-02-26 MED ORDER — CIPROFLOXACIN HCL 500 MG PO TABS: 500.0000 mg | ORAL_TABLET | Freq: Two times a day (BID) | ORAL | 0 refills | Status: DC

## 2016-02-26 MED ORDER — METRONIDAZOLE 0.75 % VA GEL: 1.0000 | Freq: Two times a day (BID) | VAGINAL | 0 refills | Status: DC
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
## 2016-02-26 NOTE — Patient Instructions (Addendum)
Urinary Tract Infection Urinary tract infections (UTIs) can develop anywhere along your urinary tract. Your urinary tract is your body's drainage system for removing wastes and extra water. Your urinary tract includes two kidneys, two ureters, a bladder, and a urethra. Your kidneys are a pair of bean-shaped organs. Each kidney is about the size of your fist. They are located below your ribs, one on each side of your spine. CAUSES Infections are caused by microbes, which are microscopic organisms, including fungi, viruses, and bacteria. These organisms are so small that they can only be seen through a microscope. Bacteria are the microbes that most commonly cause UTIs. SYMPTOMS  Symptoms of UTIs may vary by age and gender of the patient and by the location of the infection. Symptoms in young women typically include a frequent and intense urge to urinate and a painful, burning feeling in the bladder or urethra during urination. Older women and men are more likely to be tired, shaky, and weak and have muscle aches and abdominal pain. A fever may mean the infection is in your kidneys. Other symptoms of a kidney infection include pain in your back or sides below the ribs, nausea, and vomiting. DIAGNOSIS To diagnose a UTI, your caregiver will ask you about your symptoms. Your caregiver will also ask you to provide a urine sample. The urine sample will be tested for bacteria and white blood cells. White blood cells are made by your body to help fight infection. TREATMENT  Typically, UTIs can be treated with medication. Because most UTIs are caused by a bacterial infection, they usually can be treated with the use of antibiotics. The choice of antibiotic and length of treatment depend on your symptoms and the type of bacteria causing your infection. HOME CARE INSTRUCTIONS  If you were prescribed antibiotics, take them exactly as your caregiver instructs you. Finish the medication even if you feel better after  you have only taken some of the medication.  Drink enough water and fluids to keep your urine clear or pale yellow.  Avoid caffeine, tea, and carbonated beverages. They tend to irritate your bladder.  Empty your bladder often. Avoid holding urine for long periods of time.  Empty your bladder before and after sexual intercourse.  After a bowel movement, women should cleanse from front to back. Use each tissue only once. SEEK MEDICAL CARE IF:   You have back pain.  You develop a fever.  Your symptoms do not begin to resolve within 3 days. SEEK IMMEDIATE MEDICAL CARE IF:   You have severe back pain or lower abdominal pain.  You develop chills.  You have nausea or vomiting.  You have continued burning or discomfort with urination. MAKE SURE YOU:   Understand these instructions.  Will watch your condition.  Will get help right away if you are not doing well or get worse.   This information is not intended to replace advice given to you by your health care provider. Make sure you discuss any questions you have with your health care provider.   Document Released: 03/18/2005 Document Revised: 02/27/2015 Document Reviewed: 07/17/2011 Elsevier Interactive Patient Education 2016 [employer].     IF you received an x-ray today, you will receive an invoice from [facility]. Please contact [facility] at [phone] with questions or concerns regarding your invoice.   IF you received labwork today, you will receive an invoice from [employer]. Please contact [facility] at [phone] with questions or concerns regarding your invoice.  Our billing staff will not be able to assist you with questions regarding bills from these companies.  You will be contacted with the lab results as soon as they are available. The fastest way to get your results is to activate your My Chart account. Instructions are located on the last page of this  paperwork. If you have not heard from [place] regarding the results in 2 weeks, please contact this office.

## 2016-02-28 ENCOUNTER — Other Ambulatory Visit: Payer: BLUE CROSS/BLUE SHIELD

## 2016-02-28 LAB — URINE CULTURE

## 2016-03-02 ENCOUNTER — Encounter: Payer: Self-pay | Admitting: Family Medicine

## 2016-03-13 ENCOUNTER — Ambulatory Visit
Admission: RE | Admit: 2016-03-13 | Discharge: 2016-03-13 | Disposition: A | Discharge status: Home / Self Care | Payer: BLUE CROSS/BLUE SHIELD | Source: Ambulatory Visit | Attending: Family Medicine | Admitting: Family Medicine

## 2016-03-13 DIAGNOSIS — Z1231 Encounter for screening mammogram for malignant neoplasm of breast: Secondary | ICD-10-CM | POA: Diagnosis not present

## 2016-03-22 ENCOUNTER — Other Ambulatory Visit: Payer: Self-pay | Admitting: Family Medicine

## 2016-05-08 ENCOUNTER — Ambulatory Visit (INDEPENDENT_AMBULATORY_CARE_PROVIDER_SITE_OTHER): Payer: BLUE CROSS/BLUE SHIELD | Admitting: Physician Assistant

## 2016-05-08 VITALS — BP 126/74 | HR 72 | Temp 97.8°F | Resp 16 | Ht 62.0 in | Wt 195.0 lb

## 2016-05-08 DIAGNOSIS — R05 Cough: Secondary | ICD-10-CM | POA: Diagnosis not present

## 2016-05-08 DIAGNOSIS — J069 Acute upper respiratory infection, unspecified: Secondary | ICD-10-CM

## 2016-05-08 DIAGNOSIS — R519 Headache, unspecified: Secondary | ICD-10-CM

## 2016-05-08 DIAGNOSIS — R51 Headache: Secondary | ICD-10-CM | POA: Diagnosis not present

## 2016-05-08 DIAGNOSIS — R0981 Nasal congestion: Secondary | ICD-10-CM | POA: Diagnosis not present

## 2016-05-08 DIAGNOSIS — R059 Cough, unspecified: Secondary | ICD-10-CM

## 2016-05-08 MED ORDER — FLUTICASONE PROPIONATE 50 MCG/ACT NA SUSP: 2.0000 | Freq: Every day | NASAL | 0 refills | Status: DC

## 2016-05-08 MED ORDER — BENZONATATE 100 MG PO CAPS: 100.0000 mg | ORAL_CAPSULE | Freq: Three times a day (TID) | ORAL | 0 refills | Status: DC | PRN

## 2016-05-08 MED ORDER — HYDROCOD POLST-CPM POLST ER 10-8 MG/5ML PO SUER: 5.0000 mL | Freq: Two times a day (BID) | ORAL | 0 refills | Status: DC | PRN

## 2016-05-08 MED ORDER — IBUPROFEN 200 MG PO TABS
400.0000 mg | ORAL_TABLET | Freq: Once | ORAL | Status: AC
  Administered 2016-05-08: 400 mg via ORAL

## 2016-05-08 NOTE — Progress Notes (Signed)
MRN: [id number] DOB: March 16, 1961  Subjective:   Terri Hurley is a 55 y.o. female presenting for chief complaint of Nasal Congestion (x 5 days) and Cough .  Reports 5 day history of sinus headache, rhinorrhea, ear fullness, sore throat and productive cough (no hemoptysis), nausea. Has tried mucinex for the past three days and ricola drops with mild relief. Denies fever, sinus pain, ear drainage, wheezing, shortness of breath, chest tightness, chest pain and myalgia, night sweats, chills, vomiting, abdominal pain and diarrhea. 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 No history of seasonal allergies, asthma, or COPD. Patient had not had flu shot this season. Denies smoking. She has no history of pneumonia. Denies any other aggravating or relieving factors, no other questions or concerns.  Terri Hurley has a current medication list which includes the following prescription(s): acyclovir, bupropion, diazepam, escitalopram, hydrochlorothiazide, meloxicam, methocarbamol, omeprazole, senna-docusate, benzonatate, chlorpheniramine-hydrocodone, and fluticasone. Also is allergic to penicillins.  Terri Hurley  has a past medical history of Arthritis; Depression; Fatty liver disease, nonalcoholic; GERD (gastroesophageal reflux disease); Hepatitis unknown type; High cholesterol; HNP (herniated nucleus pulposus), cervical; Plantar fasciitis; Recurrent genital HSV (herpes simplex virus) infection; Urinary incontinence; and Vitamin D deficiency. Also  has a past surgical history that includes Incontinence surgery (2009); Cholecystectomy; Total abdominal hysterectomy w/ bilateral salpingoophorectomy (2009); and Spine surgery (05/2014).   Objective:   Vitals: BP 126/74 (BP Location: Right Arm, Patient Position: Sitting, Cuff Size: Large)   Pulse 72   Temp 97.8 F (36.6 C)   Resp 16   Ht 5\' 2"  (1.575 m)   Wt 195 lb (88.5 kg)   SpO2 96%   BMI 35.67 kg/m   Physical Exam    Constitutional: She is oriented to person, place, and time. She appears well-developed and well-nourished. No distress.  HENT:  Head: Normocephalic and atraumatic.  Right Ear: Tympanic membrane is erythematous and retracted.  Left Ear: Tympanic membrane is erythematous and retracted.  Nose: Mucosal edema present. Right sinus exhibits no maxillary sinus tenderness and no frontal sinus tenderness. Left sinus exhibits no maxillary sinus tenderness and no frontal sinus tenderness.  Mouth/Throat: Uvula is midline and mucous membranes are normal. Posterior oropharyngeal erythema present.  Eyes: Conjunctivae are normal.  Neck: Normal range of motion.  Cardiovascular: Normal rate, regular rhythm and normal heart sounds.   Pulmonary/Chest: Effort normal and breath sounds normal.  Lymphadenopathy:       Head (right side): No submental, no submandibular, no tonsillar, no preauricular, no posterior auricular and no occipital adenopathy present.       Head (left side): No submental, no submandibular, no tonsillar, no preauricular, no posterior auricular and no occipital adenopathy present.    She has no cervical adenopathy.       Right: No supraclavicular adenopathy present.       Left: No supraclavicular adenopathy present.  Neurological: She is alert and oriented to person, place, and time.  Skin: Skin is warm and dry.  Psychiatric: She has a normal mood and affect.  Vitals reviewed.  No results found for this or any previous visit (from the past 24 hour(s)).  Assessment and Plan :  1. Nonintractable headache, unspecified chronicity pattern, unspecified headache type - ibuprofen (ADVIL,MOTRIN) tablet 400 mg; Take 2 tablets (400 mg total) by mouth once.  2. Acute upper respiratory infection -Likely viral etiology, will treat symptomatically.  -Return to clinic if symptoms worsen, do not improve, or as needed  3.  Nasal congestion - fluticasone (FLONASE) 50 MCG/ACT nasal spray; Place 2 sprays  into both nostrils daily.  Dispense: 16 g; Refill: 0  4. Cough - benzonatate (TESSALON) 100 MG capsule; Take 1-2 capsules (100-200 mg total) by mouth 3 (three) times daily as needed for cough.  Dispense: 40 capsule; Refill: 0 - chlorpheniramine-HYDROcodone (TUSSIONEX PENNKINETIC ER) 10-8 MG/5ML SUER; Take 5 mLs by mouth every 12 (twelve) hours as needed for cough.  Dispense: 100 mL; Refill: 0  Tenna Delaine, PA-C  [facility] 05/08/2016 11:24 AM

## 2016-05-08 NOTE — Patient Instructions (Addendum)
-   We will treat this as a respiratory viral infection.  - I recommend you rest, drink plenty of fluids, eat light meals including soups.  - You may use cough syrup at night for your cough and sore throat, Tessalon pearls during the day. Be aware that cough syrup can definitely make you drowsy and sleepy so do not drive or operate any heavy machinery if it is affecting you during the day.  - You may use flonase for your nasal congestion - You may also use Tylenol or ibuprofen over-the-counter for your sore throat and headache.  - Please let me know if you are not seeing any improvement or get worse in 7 days.      IF you received an x-ray today, you will receive an invoice from [facility]. Please contact [facility] at [phone] with questions or concerns regarding your invoice.   IF you received labwork today, you will receive an invoice from [employer]. Please contact [facility] at [phone] with questions or concerns regarding your invoice.   Our billing staff will not be able to assist you with questions regarding bills from these companies.  You will be contacted with the lab results as soon as they are available. The fastest way to get your results is to activate your My Chart account. Instructions are located on the last page of this paperwork. If you have not heard from [place] regarding the results in 2 weeks, please contact this office.

## 2016-07-28 ENCOUNTER — Ambulatory Visit (INDEPENDENT_AMBULATORY_CARE_PROVIDER_SITE_OTHER): Payer: BLUE CROSS/BLUE SHIELD | Admitting: Family Medicine

## 2016-07-28 ENCOUNTER — Encounter: Payer: Self-pay | Admitting: Family Medicine

## 2016-07-28 VITALS — BP 150/80 | HR 74 | Temp 98.1°F | Ht 62.0 in | Wt 192.0 lb

## 2016-07-28 DIAGNOSIS — E559 Vitamin D deficiency, unspecified: Secondary | ICD-10-CM

## 2016-07-28 DIAGNOSIS — F419 Anxiety disorder, unspecified: Secondary | ICD-10-CM

## 2016-07-28 DIAGNOSIS — R7302 Impaired glucose tolerance (oral): Secondary | ICD-10-CM | POA: Diagnosis not present

## 2016-07-28 DIAGNOSIS — E538 Deficiency of other specified B group vitamins: Secondary | ICD-10-CM | POA: Diagnosis not present

## 2016-07-28 DIAGNOSIS — E7800 Pure hypercholesterolemia, unspecified: Secondary | ICD-10-CM | POA: Diagnosis not present

## 2016-07-28 DIAGNOSIS — I10 Essential (primary) hypertension: Secondary | ICD-10-CM | POA: Diagnosis not present

## 2016-07-28 DIAGNOSIS — K760 Fatty (change of) liver, not elsewhere classified: Secondary | ICD-10-CM | POA: Diagnosis not present

## 2016-07-28 DIAGNOSIS — F418 Other specified anxiety disorders: Secondary | ICD-10-CM

## 2016-07-28 DIAGNOSIS — E66812 Obesity, class 2: Secondary | ICD-10-CM

## 2016-07-28 DIAGNOSIS — E669 Obesity, unspecified: Secondary | ICD-10-CM

## 2016-07-28 DIAGNOSIS — R0981 Nasal congestion: Secondary | ICD-10-CM

## 2016-07-28 DIAGNOSIS — F32A Depression, unspecified: Secondary | ICD-10-CM

## 2016-07-28 DIAGNOSIS — M5020 Other cervical disc displacement, unspecified cervical region: Secondary | ICD-10-CM

## 2016-07-28 DIAGNOSIS — K219 Gastro-esophageal reflux disease without esophagitis: Secondary | ICD-10-CM

## 2016-07-28 DIAGNOSIS — F329 Major depressive disorder, single episode, unspecified: Secondary | ICD-10-CM | POA: Diagnosis not present

## 2016-07-28 MED ORDER — ACYCLOVIR 400 MG PO TABS: 400.0000 mg | ORAL_TABLET | ORAL | 5 refills | Status: DC

## 2016-07-28 MED ORDER — DIAZEPAM 5 MG PO TABS: 2.5000 mg | ORAL_TABLET | Freq: Every day | ORAL | 0 refills | Status: DC | PRN

## 2016-07-28 MED ORDER — METHOCARBAMOL 750 MG PO TABS: 750.0000 mg | ORAL_TABLET | Freq: Three times a day (TID) | ORAL | 1 refills | Status: DC | PRN

## 2016-07-28 MED ORDER — FLUTICASONE PROPIONATE 50 MCG/ACT NA SUSP: 2.0000 | Freq: Every day | NASAL | 5 refills | Status: DC

## 2016-07-28 NOTE — Patient Instructions (Addendum)
RECOMMEND NASAL SALINE SPRAY --- 2 SPRAYS INTO EACH NOSTRIL EVERY NIGHT.  Dr. Carolynn Sayers is a good eye doctor.   START EXERCISING!!!!   IF you received an x-ray today, you will receive an invoice from [facility]. Please contact [facility] at [phone] with questions or concerns regarding your invoice.   IF you received labwork today, you will receive an invoice from [facility]. Please contact [facility] at [phone] with questions or concerns regarding your invoice.   Our billing staff will not be able to assist you with questions regarding bills from these companies.  You will be contacted with the lab results as soon as they are available. The fastest way to get your results is to activate your My Chart account. Instructions are located on the last page of this paperwork. If you have not heard from [place] regarding the results in 2 weeks, please contact this office.     Fat and Cholesterol Restricted Diet Introduction Getting too much fat and cholesterol in your diet may cause health problems. Following this diet helps keep your fat and cholesterol at normal levels. This can keep you from getting sick. What types of fat should I choose?  Choose monosaturated and polyunsaturated fats. These are found in foods such as olive oil, canola oil, flaxseeds, walnuts, almonds, and seeds.  Eat more omega-3 fats. Good choices include salmon, mackerel, sardines, tuna, flaxseed oil, and ground flaxseeds.  Limit saturated fats. These are in animal products such as meats, butter, and cream. They can also be in plant products such as palm oil, palm kernel oil, and coconut oil.  Avoid foods with partially hydrogenated oils in them. These contain trans fats. Examples of foods that have trans fats are stick margarine, some tub margarines, cookies, crackers, and other baked goods. What general guidelines do I need to follow?  Check food labels. Look for the words "trans fat" and "saturated  fat."  When preparing a meal:  Fill half of your plate with vegetables and green salads.  Fill one fourth of your plate with whole grains. Look for the word "whole" as the first word in the ingredient list.  Fill one fourth of your plate with lean protein foods.  Eat more foods that have fiber, like apples, carrots, beans, peas, and barley.  Eat more home-cooked foods. Eat less at restaurants and buffets.  Limit or avoid alcohol.  Limit foods high in starch and sugar.  Limit fried foods.  Cook foods without frying them. Baking, boiling, grilling, and broiling are all great options.  Lose weight if you are overweight. Losing even a small amount of weight can help your overall health. It can also help prevent diseases such as diabetes and heart disease. What foods can I eat? Grains  Whole grains, such as whole wheat or whole grain breads, crackers, cereals, and pasta. Unsweetened oatmeal, bulgur, barley, quinoa, or brown rice. Corn or whole wheat flour tortillas. Vegetables  Fresh or frozen vegetables (raw, steamed, roasted, or grilled). Green salads. Fruits  All fresh, canned (in natural juice), or frozen fruits. Meat and Other Protein Products  Ground beef (85% or leaner), grass-fed beef, or beef trimmed of fat. Skinless chicken or [place]. Ground chicken or [place]. Pork trimmed of fat. All fish and seafood. Eggs. Dried beans, peas, or lentils. Unsalted nuts or seeds. Unsalted canned or dry beans. Dairy  Low-fat dairy products, such as skim or 1% milk, 2% or reduced-fat cheeses, low-fat ricotta or cottage cheese, or plain low-fat yogurt. Fats and  Oils  Tub margarines without trans fats. Light or reduced-fat mayonnaise and salad dressings. Avocado. Olive, canola, sesame, or safflower oils. Natural peanut or almond butter (choose ones without added sugar and oil). The items listed above may not be a complete list of recommended foods or beverages. Contact your dietitian for more  options.  What foods are not recommended? Grains  White bread. White pasta. White rice. Cornbread. Bagels, pastries, and croissants. Crackers that contain trans fat. Vegetables  White potatoes. Corn. Creamed or fried vegetables. Vegetables in a cheese sauce. Fruits  Dried fruits. Canned fruit in light or heavy syrup. Fruit juice. Meat and Other Protein Products  Fatty cuts of meat. Ribs, chicken wings, bacon, sausage, bologna, salami, chitterlings, fatback, hot dogs, bratwurst, and packaged luncheon meats. Liver and organ meats. Dairy  Whole or 2% milk, cream, half-and-half, and cream cheese. Whole milk cheeses. Whole-fat or sweetened yogurt. Full-fat cheeses. Nondairy creamers and whipped toppings. Processed cheese, cheese spreads, or cheese curds. Sweets and Desserts  Corn syrup, sugars, honey, and molasses. Candy. Jam and jelly. Syrup. Sweetened cereals. Cookies, pies, cakes, donuts, muffins, and ice cream. Fats and Oils  Butter, stick margarine, lard, shortening, ghee, or bacon fat. Coconut, palm kernel, or palm oils. Beverages  Alcohol. Sweetened drinks (such as sodas, lemonade, and fruit drinks or punches). The items listed above may not be a complete list of foods and beverages to avoid. Contact your dietitian for more information.  This information is not intended to replace advice given to you by your health care provider. Make sure you discuss any questions you have with your health care provider. Document Released: 12/08/2011 Document Revised: 02/13/2016 Document Reviewed: 09/07/2013  2017 Elsevier
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
## 2016-07-29 ENCOUNTER — Encounter: Payer: Self-pay | Admitting: Family Medicine

## 2016-07-29 LAB — CBC WITH DIFFERENTIAL/PLATELET
Basophils Absolute: 0 10*3/uL (ref 0.0–0.2)
Basos: 1 %
EOS (ABSOLUTE): 0 10*3/uL (ref 0.0–0.4)
Eos: 0 %
Hematocrit: 41.8 % (ref 34.0–46.6)
Hemoglobin: 13.8 g/dL (ref 11.1–15.9)
Immature Grans (Abs): 0 10*3/uL (ref 0.0–0.1)
Immature Granulocytes: 0 %
Lymphocytes Absolute: 1.2 10*3/uL (ref 0.7–3.1)
Lymphs: 20 %
MCH: 29.5 pg (ref 26.6–33.0)
MCHC: 33 g/dL (ref 31.5–35.7)
MCV: 89 fL (ref 79–97)
Monocytes Absolute: 0.5 10*3/uL (ref 0.1–0.9)
Monocytes: 8 %
Neutrophils Absolute: 4.3 10*3/uL (ref 1.4–7.0)
Neutrophils: 71 %
Platelets: 286 10*3/uL (ref 150–379)
RBC: 4.68 x10E6/uL (ref 3.77–5.28)
RDW: 14.7 % (ref 12.3–15.4)
WBC: 6.1 10*3/uL (ref 3.4–10.8)

## 2016-07-29 LAB — LIPID PANEL
Chol/HDL Ratio: 3.2 ratio units (ref 0.0–4.4)
Cholesterol, Total: 213 mg/dL — ABNORMAL HIGH (ref 100–199)
HDL: 67 mg/dL (ref >39)
LDL Calculated: 124 mg/dL — ABNORMAL HIGH (ref 0–99)
Triglycerides: 110 mg/dL (ref 0–149)
VLDL Cholesterol Cal: 22 mg/dL (ref 5–40)

## 2016-07-29 LAB — COMPREHENSIVE METABOLIC PANEL
ALT: 34 IU/L — ABNORMAL HIGH (ref 0–32)
AST: 25 IU/L (ref 0–40)
Albumin/Globulin Ratio: 1.6 (ref 1.2–2.2)
Albumin: 4.5 g/dL (ref 3.5–5.5)
Alkaline Phosphatase: 78 IU/L (ref 39–117)
BUN/Creatinine Ratio: 9 (ref 9–23)
BUN: 10 mg/dL (ref 6–24)
Bilirubin Total: 0.4 mg/dL (ref 0.0–1.2)
CO2: 26 mmol/L (ref 18–29)
Calcium: 9.5 mg/dL (ref 8.7–10.2)
Chloride: 95 mmol/L — ABNORMAL LOW (ref 96–106)
Creatinine, Ser: 1.08 mg/dL — ABNORMAL HIGH (ref 0.57–1.00)
GFR calc Af Amer: 66 mL/min/{1.73_m2} (ref >59)
GFR calc non Af Amer: 58 mL/min/{1.73_m2} — ABNORMAL LOW (ref >59)
Globulin, Total: 2.8 g/dL (ref 1.5–4.5)
Glucose: 94 mg/dL (ref 65–99)
Potassium: 4.5 mmol/L (ref 3.5–5.2)
Sodium: 138 mmol/L (ref 134–144)
Total Protein: 7.3 g/dL (ref 6.0–8.5)

## 2016-07-29 LAB — HEMOGLOBIN A1C
Est. average glucose Bld gHb Est-mCnc: 111 mg/dL
Hgb A1c MFr Bld: 5.5 % (ref 4.8–5.6)

## 2016-07-29 LAB — VITAMIN D 25 HYDROXY (VIT D DEFICIENCY, FRACTURES): Vit D, 25-Hydroxy: 24.9 ng/mL — ABNORMAL LOW (ref 30.0–100.0)

## 2016-07-29 LAB — VITAMIN B12: Vitamin B-12: 1794 pg/mL — ABNORMAL HIGH (ref 232–1245)

## 2016-07-30 ENCOUNTER — Other Ambulatory Visit: Payer: Self-pay

## 2016-07-30 NOTE — Telephone Encounter (Signed)
Fax request from [facility]  To clarify directions on Acyclovir:   SIG:  take 1 tablet (400mg ) by mouth 1 day or 1 dose.  From annual visit with you, notes states daily acyclovir.  Please advise for pharmacy

## 2016-08-03 MED ORDER — FLUOCINOLONE ACETONIDE 0.01 % OT OIL: 1.0000 "application " | TOPICAL_OIL | Freq: Two times a day (BID) | OTIC | 0 refills | Status: DC | PRN

## 2016-08-03 MED ORDER — ACYCLOVIR 400 MG PO TABS: 400.0000 mg | ORAL_TABLET | Freq: Every day | ORAL | 5 refills | Status: DC

## 2016-11-23 ENCOUNTER — Encounter: Payer: Self-pay | Admitting: Physician Assistant

## 2016-11-23 ENCOUNTER — Ambulatory Visit (INDEPENDENT_AMBULATORY_CARE_PROVIDER_SITE_OTHER): Payer: BLUE CROSS/BLUE SHIELD | Admitting: Physician Assistant

## 2016-11-23 VITALS — BP 152/84 | HR 95 | Temp 100.1°F | Resp 18 | Ht 62.0 in | Wt 198.6 lb

## 2016-11-23 DIAGNOSIS — J069 Acute upper respiratory infection, unspecified: Secondary | ICD-10-CM | POA: Diagnosis not present

## 2016-11-23 DIAGNOSIS — R509 Fever, unspecified: Secondary | ICD-10-CM | POA: Diagnosis not present

## 2016-11-23 MED ORDER — HYDROCOD POLST-CPM POLST ER 10-8 MG/5ML PO SUER: 5.0000 mL | Freq: Two times a day (BID) | ORAL | 0 refills | Status: DC | PRN

## 2016-11-23 MED ORDER — GUAIFENESIN ER 1200 MG PO TB12: 1.0000 | ORAL_TABLET | Freq: Two times a day (BID) | ORAL | 0 refills | Status: AC

## 2016-11-23 MED ORDER — ACETAMINOPHEN 500 MG PO TABS
500.0000 mg | ORAL_TABLET | Freq: Once | ORAL | Status: AC
  Administered 2016-11-23: 500 mg via ORAL

## 2016-11-23 MED ORDER — DOXYCYCLINE HYCLATE 100 MG PO TABS: 100.0000 mg | ORAL_TABLET | Freq: Two times a day (BID) | ORAL | 0 refills | Status: AC

## 2016-11-23 NOTE — Patient Instructions (Addendum)
  Please push fluids.  Tylenol and Motrin for fever and body aches.      Nasal saline spray can be helpful to keep the mucus membranes moist and thin the nasal mucus    IF you received an x-ray today, you will receive an invoice from [facility]. Please contact [facility] at [phone] with questions or concerns regarding your invoice.   IF you received labwork today, you will receive an invoice from [facility]. Please contact [facility] at [phone] with questions or concerns regarding your invoice.   Our billing staff will not be able to assist you with questions regarding bills from these companies.  You will be contacted with the lab results as soon as they are available. The fastest way to get your results is to activate your My Chart account. Instructions are located on the last page of this paperwork. If you have not heard from us regarding the results in 2 weeks, please contact this office.     

## 2016-11-23 NOTE — Progress Notes (Signed)
Terri Hurley  MRN: [id number] DOB: Jul 08, 1960  PCP: Wardell Honour, MD  Chief Complaint  Patient presents with  . Fever    temp was 100.1  . Sore Throat  . Cough    x2days   . Headache    Subjective:  Pt presents to clinic for cold symptoms for the last 4 days.  Since then the symptoms have worsened.  The cough is the worse - it is dry and seems to be coming from her throat - she has some mild nasal congestion but not aware of PND.  She is not have any SOB but maybe some wheezing at times.  She is not sleeping well due to the cough.  Review of Systems  Constitutional: Positive for chills and fever.  HENT: Positive for congestion, postnasal drip, rhinorrhea (? color) and sore throat.   Respiratory: Positive for cough (dry - rare production) and wheezing. Negative for shortness of breath.        No h/o asthma, non-smoker  Neurological: Positive for headaches.  Psychiatric/Behavioral: Positive for sleep disturbance (2nd to cough).    Patient Active Problem List   Diagnosis Date Noted  . Essential hypertension 01/28/2016  . Low back pain radiating to left leg 11/19/2011  . Obesity, Class II, BMI 35-39.9 11/18/2011  . Incontinence of urine 12/26/2010  . Arthritis   . Depression   . GERD (gastroesophageal reflux disease)   . Fatty liver disease, nonalcoholic   . HNP (herniated nucleus pulposus), cervical   . Recurrent genital HSV (herpes simplex virus) infection   . Plantar fasciitis   . Elevated liver enzymes 12/12/2010  . Vitamin D deficiency 12/12/2010  . Paresthesia 12/12/2010    Current Outpatient Prescriptions on File Prior to Visit  Medication Sig Dispense Refill  . acyclovir (ZOVIRAX) 400 MG tablet Take 1 tablet (400 mg total) by mouth daily. 30 tablet 5  . buPROPion (WELLBUTRIN XL) 300 MG 24 hr tablet Take 1 tablet (300 mg total) by mouth daily. 90 tablet 3  . diazepam (VALIUM) 5 MG tablet Take 0.5-1 tablets (2.5-5 mg total) by mouth daily as needed for  anxiety. 30 tablet 0  . escitalopram (LEXAPRO) 10 MG tablet Take 1 tablet (10 mg total) by mouth daily. 90 tablet 3  . fluticasone (FLONASE) 50 MCG/ACT nasal spray Place 2 sprays into both nostrils daily. 16 g 5  . hydrochlorothiazide (MICROZIDE) 12.5 MG capsule Take 1 capsule (12.5 mg total) by mouth daily. 90 capsule 3  . omeprazole (PRILOSEC) 20 MG capsule Take 1 capsule (20 mg total) by mouth daily. 90 capsule 3  . senna-docusate (SENOKOT-S) 8.6-50 MG per tablet Take 2 tablets by mouth 2 (two) times daily.     . Fluocinolone Acetonide 0.01 % OIL Place 1 application in ear(s) 2 (two) times daily as needed. (Patient not taking: Reported on 11/23/2016) 20 mL 0   No current facility-administered medications on file prior to visit.     Allergies  Allergen Reactions  . Robaxin [Methocarbamol] Rash  . Penicillins     Pt patients past, family and social history were reviewed and updated.   Objective:  BP (!) 152/84   Pulse 95   Temp 100.1 F (37.8 C) (Oral)   Resp 18   Ht 5\' 2"  (1.575 m)   Wt 198 lb 9.6 oz (90.1 kg)   SpO2 98%   BMI 36.32 kg/m   Physical Exam  Constitutional: She is oriented to person, place, and time and well-developed,  well-nourished, and in no distress.  HENT:  Head: Normocephalic and atraumatic.  Right Ear: Hearing, tympanic membrane, external ear and ear canal normal.  Left Ear: Hearing, tympanic membrane, external ear and ear canal normal.  Nose: Nose normal.  Mouth/Throat: Uvula is midline, oropharynx is clear and moist and mucous membranes are normal.  Eyes: Conjunctivae are normal.  Neck: Normal range of motion.  Cardiovascular: Normal rate, regular rhythm and normal heart sounds.   No murmur heard. Pulmonary/Chest: Effort normal and breath sounds normal. No respiratory distress. She has no wheezes.  Neurological: She is alert and oriented to person, place, and time. Gait normal.  Skin: Skin is warm and dry.  Psychiatric: Mood, memory, affect and  judgment normal.  Vitals reviewed.   Assessment and Plan :  Fever, unspecified - Plan: acetaminophen (TYLENOL) tablet 500 mg, Care order/instruction:  URI with cough and congestion - Plan: chlorpheniramine-HYDROcodone (TUSSIONEX PENNKINETIC ER) 10-8 MG/5ML SUER, Guaifenesin (MUCINEX MAXIMUM STRENGTH) 1200 MG TB12, doxycycline (VIBRA-TABS) 100 MG tablet   Symptomatic care d/w pt - she was given an abx to use in 3-4 days if she is not improving - she is to use Rx as directed  Windell Hummingbird PA-C  [facility] at [place] 11/23/2016 2:40 PM

## 2016-12-21 ENCOUNTER — Ambulatory Visit (INDEPENDENT_AMBULATORY_CARE_PROVIDER_SITE_OTHER): Payer: BLUE CROSS/BLUE SHIELD | Admitting: Physician Assistant

## 2016-12-21 ENCOUNTER — Encounter: Payer: Self-pay | Admitting: Physician Assistant

## 2016-12-21 VITALS — BP 155/87 | HR 91 | Temp 98.4°F | Resp 18 | Ht 62.0 in | Wt 196.6 lb

## 2016-12-21 DIAGNOSIS — R350 Frequency of micturition: Secondary | ICD-10-CM | POA: Diagnosis not present

## 2016-12-21 DIAGNOSIS — N3001 Acute cystitis with hematuria: Secondary | ICD-10-CM | POA: Diagnosis not present

## 2016-12-21 LAB — POC MICROSCOPIC URINALYSIS (UMFC): Mucus: ABSENT

## 2016-12-21 LAB — POCT URINALYSIS DIP (MANUAL ENTRY)
Bilirubin, UA: NEGATIVE
Glucose, UA: NEGATIVE mg/dL
Ketones, POC UA: NEGATIVE mg/dL
Nitrite, UA: POSITIVE — AB
Protein Ur, POC: 100 mg/dL — AB
Spec Grav, UA: 1.015 (ref 1.010–1.025)
Urobilinogen, UA: 0.2 E.U./dL
pH, UA: 7 (ref 5.0–8.0)

## 2016-12-21 MED ORDER — PHENAZOPYRIDINE HCL 200 MG PO TABS: 200.0000 mg | ORAL_TABLET | Freq: Three times a day (TID) | ORAL | 0 refills | Status: DC | PRN

## 2016-12-21 MED ORDER — NITROFURANTOIN MONOHYD MACRO 100 MG PO CAPS: 100.0000 mg | ORAL_CAPSULE | Freq: Two times a day (BID) | ORAL | 0 refills | Status: AC

## 2016-12-21 NOTE — Progress Notes (Signed)
shay 

## 2016-12-21 NOTE — Patient Instructions (Signed)
     IF you received an x-ray today, you will receive an invoice from [facility]. Please contact [facility] at [phone] with questions or concerns regarding your invoice.   IF you received labwork today, you will receive an invoice from [facility]. Please contact [facility] at [phone] with questions or concerns regarding your invoice.   Our billing staff will not be able to assist you with questions regarding bills from these companies.  You will be contacted with the lab results as soon as they are available. The fastest way to get your results is to activate your My Chart account. Instructions are located on the last page of this paperwork. If you have not heard from us regarding the results in 2 weeks, please contact this office.     

## 2016-12-21 NOTE — Progress Notes (Signed)
Terri Hurley  MRN: [id number] DOB: 12-Jul-1960  PCP: Wardell Honour, MD  Chief Complaint  Patient presents with  . Urinary Frequency    but cant go  x4days   . Nausea    Subjective:  Pt presents to clinic for for UTI symptoms for the last 4 days.  She has urgency and frequency bur no dysuria which is typical for her.  She has had some mild nausea that is intermittent and abd pressure and low back pain.  She has tried nothing OTC.  Review of Systems  Constitutional: Negative for chills and fever.  Gastrointestinal: Positive for abdominal pain (pressure) and nausea (slight).  Genitourinary: Positive for frequency and urgency. Negative for dysuria, hematuria and vaginal discharge.  Musculoskeletal: Positive for back pain.    Patient Active Problem List   Diagnosis Date Noted  . Essential hypertension 01/28/2016  . Low back pain radiating to left leg 11/19/2011  . Obesity, Class II, BMI 35-39.9 11/18/2011  . Incontinence of urine 12/26/2010  . Arthritis   . Depression   . GERD (gastroesophageal reflux disease)   . Fatty liver disease, nonalcoholic   . HNP (herniated nucleus pulposus), cervical   . Recurrent genital HSV (herpes simplex virus) infection   . Plantar fasciitis   . Elevated liver enzymes 12/12/2010  . Vitamin D deficiency 12/12/2010  . Paresthesia 12/12/2010    Current Outpatient Prescriptions on File Prior to Visit  Medication Sig Dispense Refill  . acyclovir (ZOVIRAX) 400 MG tablet Take 1 tablet (400 mg total) by mouth daily. 30 tablet 5  . buPROPion (WELLBUTRIN XL) 300 MG 24 hr tablet Take 1 tablet (300 mg total) by mouth daily. 90 tablet 3  . chlorpheniramine-HYDROcodone (TUSSIONEX PENNKINETIC ER) 10-8 MG/5ML SUER Take 5 mLs by mouth 2 (two) times daily as needed for cough. 70 mL 0  . diazepam (VALIUM) 5 MG tablet Take 0.5-1 tablets (2.5-5 mg total) by mouth daily as needed for anxiety. 30 tablet 0  . escitalopram (LEXAPRO) 10 MG tablet Take 1 tablet (10  mg total) by mouth daily. 90 tablet 3  . fluticasone (FLONASE) 50 MCG/ACT nasal spray Place 2 sprays into both nostrils daily. 16 g 5  . hydrochlorothiazide (MICROZIDE) 12.5 MG capsule Take 1 capsule (12.5 mg total) by mouth daily. 90 capsule 3  . omeprazole (PRILOSEC) 20 MG capsule Take 1 capsule (20 mg total) by mouth daily. 90 capsule 3  . senna-docusate (SENOKOT-S) 8.6-50 MG per tablet Take 2 tablets by mouth 2 (two) times daily.     . Fluocinolone Acetonide 0.01 % OIL Place 1 application in ear(s) 2 (two) times daily as needed. (Patient not taking: Reported on 11/23/2016) 20 mL 0   No current facility-administered medications on file prior to visit.     Allergies  Allergen Reactions  . Robaxin [Methocarbamol] Rash  . Penicillins     Pt patients past, family and social history were reviewed and updated.   Objective:  BP (!) 155/87   Pulse 91   Temp 98.4 F (36.9 C) (Oral)   Resp 18   Ht 5\' 2"  (1.575 m)   Wt 196 lb 9.6 oz (89.2 kg)   SpO2 98%   BMI 35.96 kg/m   Physical Exam  Constitutional: She is oriented to person, place, and time and well-developed, well-nourished, and in no distress.  HENT:  Head: Normocephalic and atraumatic.  Right Ear: External ear normal.  Left Ear: External ear normal.  Cardiovascular: Normal rate, regular rhythm  and normal heart sounds.   No murmur heard. Pulmonary/Chest: Effort normal and breath sounds normal.  Abdominal: Soft. There is tenderness in the suprapubic area. There is no CVA tenderness.  Neurological: She is alert and oriented to person, place, and time. Gait normal.  Skin: Skin is warm and dry.  Psychiatric: Mood, memory, affect and judgment normal.  Vitals reviewed.  Results for orders placed or performed in visit on 12/21/16  POCT Microscopic Urinalysis ([facility])  Result Value Ref Range   WBC,UR,HPF,POC Too numerous to count  (A) None WBC/hpf   RBC,UR,HPF,POC Few (A) None RBC/hpf   Bacteria None None, Too numerous to count    Mucus Absent Absent   Epithelial Cells, UR Per Microscopy Few (A) None, Too numerous to count cells/hpf  POCT urinalysis dipstick  Result Value Ref Range   Color, UA yellow yellow   Clarity, UA clear clear   Glucose, UA negative negative mg/dL   Bilirubin, UA negative negative   Ketones, POC UA negative negative mg/dL   Spec Grav, UA 1.015 1.010 - 1.025   Blood, UA large (A) negative   pH, UA 7.0 5.0 - 8.0   Protein Ur, POC =100 (A) negative mg/dL   Urobilinogen, UA 0.2 0.2 or 1.0 E.U./dL   Nitrite, UA Positive (A) Negative   Leukocytes, UA Moderate (2+) (A) Negative    Assessment and Plan :  Frequency of urination - Plan: POCT Microscopic Urinalysis ([facility]), POCT urinalysis dipstick, Urine Culture  Acute cystitis with hematuria - Plan: nitrofurantoin, macrocrystal-monohydrate, (MACROBID) 100 MG capsule, phenazopyridine (PYRIDIUM) 200 MG tablet   Push fluids and finish all abx.  Windell Hummingbird PA-C  [facility] at [facility] 12/22/2016 7:06 AM

## 2016-12-24 LAB — URINE CULTURE

## 2016-12-26 MED ORDER — CIPROFLOXACIN HCL 500 MG PO TABS: 500.0000 mg | ORAL_TABLET | Freq: Two times a day (BID) | ORAL | 0 refills | Status: AC

## 2016-12-26 NOTE — Addendum Note (Signed)
Addended by: Mancel Bale on: 12/26/2016 05:12 PM   Modules accepted: Orders

## 2017-03-03 ENCOUNTER — Encounter: Payer: Self-pay | Admitting: Family Medicine

## 2017-03-03 ENCOUNTER — Ambulatory Visit (INDEPENDENT_AMBULATORY_CARE_PROVIDER_SITE_OTHER): Payer: BLUE CROSS/BLUE SHIELD | Admitting: Family Medicine

## 2017-03-03 VITALS — BP 146/80 | HR 87 | Temp 98.0°F | Resp 16 | Ht 61.42 in | Wt 199.0 lb

## 2017-03-03 DIAGNOSIS — H608X3 Other otitis externa, bilateral: Secondary | ICD-10-CM | POA: Diagnosis not present

## 2017-03-03 DIAGNOSIS — E559 Vitamin D deficiency, unspecified: Secondary | ICD-10-CM

## 2017-03-03 DIAGNOSIS — K219 Gastro-esophageal reflux disease without esophagitis: Secondary | ICD-10-CM | POA: Diagnosis not present

## 2017-03-03 DIAGNOSIS — N390 Urinary tract infection, site not specified: Secondary | ICD-10-CM | POA: Diagnosis not present

## 2017-03-03 DIAGNOSIS — Z131 Encounter for screening for diabetes mellitus: Secondary | ICD-10-CM

## 2017-03-03 DIAGNOSIS — F32A Depression, unspecified: Secondary | ICD-10-CM

## 2017-03-03 DIAGNOSIS — F329 Major depressive disorder, single episode, unspecified: Secondary | ICD-10-CM

## 2017-03-03 DIAGNOSIS — M5020 Other cervical disc displacement, unspecified cervical region: Secondary | ICD-10-CM | POA: Diagnosis not present

## 2017-03-03 DIAGNOSIS — R0981 Nasal congestion: Secondary | ICD-10-CM | POA: Diagnosis not present

## 2017-03-03 DIAGNOSIS — F419 Anxiety disorder, unspecified: Secondary | ICD-10-CM | POA: Diagnosis not present

## 2017-03-03 DIAGNOSIS — Z0000 Encounter for general adult medical examination without abnormal findings: Secondary | ICD-10-CM | POA: Diagnosis not present

## 2017-03-03 DIAGNOSIS — N3941 Urge incontinence: Secondary | ICD-10-CM

## 2017-03-03 DIAGNOSIS — I10 Essential (primary) hypertension: Secondary | ICD-10-CM | POA: Diagnosis not present

## 2017-03-03 DIAGNOSIS — K760 Fatty (change of) liver, not elsewhere classified: Secondary | ICD-10-CM

## 2017-03-03 LAB — POCT URINALYSIS DIP (MANUAL ENTRY)
Bilirubin, UA: NEGATIVE
Glucose, UA: NEGATIVE mg/dL
Ketones, POC UA: NEGATIVE mg/dL
Nitrite, UA: POSITIVE — AB
Protein Ur, POC: 30 mg/dL — AB
Spec Grav, UA: 1.02 (ref 1.010–1.025)
Urobilinogen, UA: 0.2 E.U./dL
pH, UA: 7 (ref 5.0–8.0)

## 2017-03-03 MED ORDER — NEOMYCIN-POLYMYXIN-HC 3.5-10000-1 OT SOLN: 4.0000 [drp] | Freq: Four times a day (QID) | OTIC | 2 refills | Status: DC

## 2017-03-03 MED ORDER — FLUTICASONE PROPIONATE 50 MCG/ACT NA SUSP: 2.0000 | Freq: Every day | NASAL | 5 refills | Status: DC

## 2017-03-03 MED ORDER — ESCITALOPRAM OXALATE 10 MG PO TABS: 10.0000 mg | ORAL_TABLET | Freq: Every day | ORAL | 3 refills | Status: DC

## 2017-03-03 MED ORDER — OMEPRAZOLE 20 MG PO CPDR: 20.0000 mg | DELAYED_RELEASE_CAPSULE | Freq: Every day | ORAL | 3 refills | Status: DC

## 2017-03-03 MED ORDER — CIPROFLOXACIN HCL 250 MG PO TABS
250.0000 mg | ORAL_TABLET | Freq: Two times a day (BID) | ORAL | 0 refills | Status: DC
Start: 2017-03-03 — End: 2017-06-02

## 2017-03-03 MED ORDER — LOSARTAN POTASSIUM-HCTZ 50-12.5 MG PO TABS: 1.0000 | ORAL_TABLET | Freq: Every day | ORAL | 3 refills | Status: DC

## 2017-03-03 MED ORDER — DIAZEPAM 5 MG PO TABS: 2.5000 mg | ORAL_TABLET | Freq: Every day | ORAL | 0 refills | Status: DC | PRN

## 2017-03-03 MED ORDER — BUPROPION HCL ER (XL) 300 MG PO TB24: 300.0000 mg | ORAL_TABLET | Freq: Every day | ORAL | 3 refills | Status: DC
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
## 2017-03-03 NOTE — Progress Notes (Signed)
   Subjective:    Patient ID: Terri Hurley, female    DOB: November 23, 1960, 56 y.o.   MRN: [id number]  HPI    Review of Systems  Constitutional: Positive for activity change, diaphoresis and fatigue.  HENT: Positive for ear discharge and trouble swallowing.   Eyes: Positive for photophobia and itching.  Endocrine: Positive for heat intolerance and polydipsia.  Genitourinary: Positive for decreased urine volume, difficulty urinating, dysuria, flank pain and pelvic pain.  Musculoskeletal: Positive for arthralgias, back pain, joint swelling, myalgias, neck pain and neck stiffness.  Allergic/Immunologic: Positive for environmental allergies.  Neurological: Positive for weakness and light-headedness.  Psychiatric/Behavioral: Positive for agitation, confusion, decreased concentration and sleep disturbance. The patient is nervous/anxious.        Objective:   Physical Exam        Assessment & Plan:

## 2017-03-03 NOTE — Patient Instructions (Addendum)
IF you received an x-ray today, you will receive an invoice from [facility]. Please contact [facility] at [phone] with questions or concerns regarding your invoice.   IF you received labwork today, you will receive an invoice from [facility]. Please contact [facility] at [phone] with questions or concerns regarding your invoice.   Our billing staff will not be able to assist you with questions regarding bills from these companies.  You will be contacted with the lab results as soon as they are available. The fastest way to get your results is to activate your My Chart account. Instructions are located on the last page of this paperwork. If you have not heard from [place] regarding the results in 2 weeks, please contact this office.      Preventive Care 40-64 Years, Female Preventive care refers to lifestyle choices and visits with your health care provider that can promote health and wellness. What does preventive care include?  A yearly physical exam. This is also called an annual well check.  Dental exams once or twice a year.  Routine eye exams. Ask your health care provider how often you should have your eyes checked.  Personal lifestyle choices, including: ? Daily care of your teeth and gums. ? Regular physical activity. ? Eating a healthy diet. ? Avoiding tobacco and drug use. ? Limiting alcohol use. ? Practicing safe sex. ? Taking low-dose aspirin daily starting at age 34. ? Taking vitamin and mineral supplements as recommended by your health care provider. What happens during an annual well check? The services and screenings done by your health care provider during your annual well check will depend on your age, overall health, lifestyle risk factors, and family history of disease. Counseling Your health care provider may ask you questions about your:  Alcohol use.  Tobacco use.  Drug use.  Emotional well-being.  Home and relationship  well-being.  Sexual activity.  Eating habits.  Work and work Statistician.  Method of birth control.  Menstrual cycle.  Pregnancy history.  Screening You may have the following tests or measurements:  Height, weight, and BMI.  Blood pressure.  Lipid and cholesterol levels. These may be checked every 5 years, or more frequently if you are over 65 years old.  Skin check.  Lung cancer screening. You may have this screening every year starting at age 102 if you have a 30-pack-year history of smoking and currently smoke or have quit within the past 15 years.  Fecal occult blood test (FOBT) of the stool. You may have this test every year starting at age 33.  Flexible sigmoidoscopy or colonoscopy. You may have a sigmoidoscopy every 5 years or a colonoscopy every 10 years starting at age 23.  Hepatitis C blood test.  Hepatitis B blood test.  Sexually transmitted disease (STD) testing.  Diabetes screening. This is done by checking your blood sugar (glucose) after you have not eaten for a while (fasting). You may have this done every 1-3 years.  Mammogram. This may be done every 1-2 years. Talk to your health care provider about when you should start having regular mammograms. This may depend on whether you have a family history of breast cancer.  BRCA-related cancer screening. This may be done if you have a family history of breast, ovarian, tubal, or peritoneal cancers.  Pelvic exam and Pap test. This may be done every 3 years starting at age 3. Starting at age 55, this may be done every 5 years if  you have a Pap test in combination with an HPV test.  Bone density scan. This is done to screen for osteoporosis. You may have this scan if you are at high risk for osteoporosis.  Discuss your test results, treatment options, and if necessary, the need for more tests with your health care provider. Vaccines Your health care provider may recommend certain vaccines, such  as:  Influenza vaccine. This is recommended every year.  Tetanus, diphtheria, and acellular pertussis (Tdap, Td) vaccine. You may need a Td booster every 10 years.  Varicella vaccine. You may need this if you have not been vaccinated.  Zoster vaccine. You may need this after age 70.  Measles, mumps, and rubella (MMR) vaccine. You may need at least one dose of MMR if you were born in 1957 or later. You may also need a second dose.  Pneumococcal 13-valent conjugate (PCV13) vaccine. You may need this if you have certain conditions and were not previously vaccinated.  Pneumococcal polysaccharide (PPSV23) vaccine. You may need one or two doses if you smoke cigarettes or if you have certain conditions.  Meningococcal vaccine. You may need this if you have certain conditions.  Hepatitis A vaccine. You may need this if you have certain conditions or if you travel or work in places where you may be exposed to hepatitis A.  Hepatitis B vaccine. You may need this if you have certain conditions or if you travel or work in places where you may be exposed to hepatitis B.  Haemophilus influenzae type b (Hib) vaccine. You may need this if you have certain conditions.  Talk to your health care provider about which screenings and vaccines you need and how often you need them. This information is not intended to replace advice given to you by your health care provider. Make sure you discuss any questions you have with your health care provider. Document Released: 07/05/2015 Document Revised: 02/26/2016 Document Reviewed: 04/09/2015 Elsevier Interactive Patient Education  2017 [employer].

## 2017-03-04 LAB — COMPREHENSIVE METABOLIC PANEL
ALT: 58 IU/L — ABNORMAL HIGH (ref 0–32)
AST: 39 IU/L (ref 0–40)
Albumin/Globulin Ratio: 1.5 (ref 1.2–2.2)
Albumin: 4.3 g/dL (ref 3.5–5.5)
Alkaline Phosphatase: 72 IU/L (ref 39–117)
BUN/Creatinine Ratio: 8 — ABNORMAL LOW (ref 9–23)
BUN: 8 mg/dL (ref 6–24)
Bilirubin Total: 0.5 mg/dL (ref 0.0–1.2)
CO2: 25 mmol/L (ref 20–29)
Calcium: 9.3 mg/dL (ref 8.7–10.2)
Chloride: 98 mmol/L (ref 96–106)
Creatinine, Ser: 1.06 mg/dL — ABNORMAL HIGH (ref 0.57–1.00)
GFR calc Af Amer: 67 mL/min/{1.73_m2} (ref >59)
GFR calc non Af Amer: 58 mL/min/{1.73_m2} — ABNORMAL LOW (ref >59)
Globulin, Total: 2.8 g/dL (ref 1.5–4.5)
Glucose: 76 mg/dL (ref 65–99)
Potassium: 3.8 mmol/L (ref 3.5–5.2)
Sodium: 142 mmol/L (ref 134–144)
Total Protein: 7.1 g/dL (ref 6.0–8.5)

## 2017-03-04 LAB — CBC WITH DIFFERENTIAL/PLATELET
Basophils Absolute: 0 10*3/uL (ref 0.0–0.2)
Basos: 0 %
EOS (ABSOLUTE): 0 10*3/uL (ref 0.0–0.4)
Eos: 0 %
Hematocrit: 39 % (ref 34.0–46.6)
Hemoglobin: 12.7 g/dL (ref 11.1–15.9)
Immature Grans (Abs): 0 10*3/uL (ref 0.0–0.1)
Immature Granulocytes: 0 %
Lymphocytes Absolute: 1.5 10*3/uL (ref 0.7–3.1)
Lymphs: 18 %
MCH: 29.4 pg (ref 26.6–33.0)
MCHC: 32.6 g/dL (ref 31.5–35.7)
MCV: 90 fL (ref 79–97)
Monocytes Absolute: 0.6 10*3/uL (ref 0.1–0.9)
Monocytes: 8 %
Neutrophils Absolute: 5.9 10*3/uL (ref 1.4–7.0)
Neutrophils: 74 %
Platelets: 296 10*3/uL (ref 150–379)
RBC: 4.32 x10E6/uL (ref 3.77–5.28)
RDW: 14.4 % (ref 12.3–15.4)
WBC: 8.1 10*3/uL (ref 3.4–10.8)

## 2017-03-04 LAB — TSH: TSH: 1.26 u[IU]/mL (ref 0.450–4.500)

## 2017-03-04 LAB — URINALYSIS, MICROSCOPIC ONLY
Casts: NONE SEEN /lpf
RBC, UA: >30 /hpf — AB (ref 0–?)
WBC, UA: >30 /hpf — AB (ref 0–?)

## 2017-03-04 LAB — LIPID PANEL
Chol/HDL Ratio: 2.8 ratio (ref 0.0–4.4)
Cholesterol, Total: 194 mg/dL (ref 100–199)
HDL: 69 mg/dL (ref >39)
LDL Calculated: 106 mg/dL — ABNORMAL HIGH (ref 0–99)
Triglycerides: 97 mg/dL (ref 0–149)
VLDL Cholesterol Cal: 19 mg/dL (ref 5–40)

## 2017-03-04 LAB — HEMOGLOBIN A1C
Est. average glucose Bld gHb Est-mCnc: 111 mg/dL
Hgb A1c MFr Bld: 5.5 % (ref 4.8–5.6)

## 2017-03-04 LAB — URINE CULTURE

## 2017-03-13 ENCOUNTER — Encounter: Payer: Self-pay | Admitting: Family Medicine

## 2017-03-28 ENCOUNTER — Other Ambulatory Visit: Payer: Self-pay | Admitting: Family Medicine

## 2017-06-02 ENCOUNTER — Other Ambulatory Visit: Payer: Self-pay

## 2017-06-02 ENCOUNTER — Ambulatory Visit: Payer: BLUE CROSS/BLUE SHIELD | Admitting: Family Medicine

## 2017-06-02 ENCOUNTER — Encounter: Payer: Self-pay | Admitting: Family Medicine

## 2017-06-02 VITALS — BP 130/80 | HR 79 | Temp 98.1°F | Resp 16 | Ht 62.6 in | Wt 203.0 lb

## 2017-06-02 DIAGNOSIS — K760 Fatty (change of) liver, not elsewhere classified: Secondary | ICD-10-CM | POA: Diagnosis not present

## 2017-06-02 DIAGNOSIS — E559 Vitamin D deficiency, unspecified: Secondary | ICD-10-CM | POA: Diagnosis not present

## 2017-06-02 DIAGNOSIS — K219 Gastro-esophageal reflux disease without esophagitis: Secondary | ICD-10-CM | POA: Diagnosis not present

## 2017-06-02 DIAGNOSIS — N390 Urinary tract infection, site not specified: Secondary | ICD-10-CM | POA: Diagnosis not present

## 2017-06-02 DIAGNOSIS — M791 Myalgia, unspecified site: Secondary | ICD-10-CM

## 2017-06-02 DIAGNOSIS — I10 Essential (primary) hypertension: Secondary | ICD-10-CM | POA: Diagnosis not present

## 2017-06-02 DIAGNOSIS — N3941 Urge incontinence: Secondary | ICD-10-CM

## 2017-06-02 LAB — POCT URINALYSIS DIP (MANUAL ENTRY)
Bilirubin, UA: NEGATIVE
Blood, UA: NEGATIVE
Glucose, UA: NEGATIVE mg/dL
Ketones, POC UA: NEGATIVE mg/dL
Leukocytes, UA: NEGATIVE
Nitrite, UA: NEGATIVE
Protein Ur, POC: NEGATIVE mg/dL
Spec Grav, UA: 1.015 (ref 1.010–1.025)
Urobilinogen, UA: 0.2 E.U./dL
pH, UA: 7 (ref 5.0–8.0)

## 2017-06-02 MED ORDER — OXYBUTYNIN CHLORIDE ER 5 MG PO TB24: 5.0000 mg | ORAL_TABLET | Freq: Every day | ORAL | 5 refills | Status: DC

## 2017-06-02 MED ORDER — CYCLOBENZAPRINE HCL 5 MG PO TABS: 5.0000 mg | ORAL_TABLET | Freq: Three times a day (TID) | ORAL | 4 refills | Status: DC | PRN

## 2017-06-02 NOTE — Patient Instructions (Signed)
     IF you received an x-ray today, you will receive an invoice from [facility]. Please contact [facility] at [phone] with questions or concerns regarding your invoice.   IF you received labwork today, you will receive an invoice from [facility]. Please contact [facility] at [phone] with questions or concerns regarding your invoice.   Our billing staff will not be able to assist you with questions regarding bills from these companies.  You will be contacted with the lab results as soon as they are available. The fastest way to get your results is to activate your My Chart account. Instructions are located on the last page of this paperwork. If you have not heard from us regarding the results in 2 weeks, please contact this office.     
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
## 2017-06-03 LAB — VITAMIN D 25 HYDROXY (VIT D DEFICIENCY, FRACTURES): Vit D, 25-Hydroxy: 21.6 ng/mL — ABNORMAL LOW (ref 30.0–100.0)

## 2017-06-03 LAB — COMPREHENSIVE METABOLIC PANEL
ALT: 65 IU/L — ABNORMAL HIGH (ref 0–32)
AST: 44 IU/L — ABNORMAL HIGH (ref 0–40)
Albumin/Globulin Ratio: 1.5 (ref 1.2–2.2)
Albumin: 4.1 g/dL (ref 3.5–5.5)
Alkaline Phosphatase: 76 IU/L (ref 39–117)
BUN/Creatinine Ratio: 8 — ABNORMAL LOW (ref 9–23)
BUN: 8 mg/dL (ref 6–24)
Bilirubin Total: 0.3 mg/dL (ref 0.0–1.2)
CO2: 26 mmol/L (ref 20–29)
Calcium: 8.9 mg/dL (ref 8.7–10.2)
Chloride: 93 mmol/L — ABNORMAL LOW (ref 96–106)
Creatinine, Ser: 0.96 mg/dL (ref 0.57–1.00)
GFR calc Af Amer: 76 mL/min/{1.73_m2} (ref >59)
GFR calc non Af Amer: 66 mL/min/{1.73_m2} (ref >59)
Globulin, Total: 2.8 g/dL (ref 1.5–4.5)
Glucose: 77 mg/dL (ref 65–99)
Potassium: 3.7 mmol/L (ref 3.5–5.2)
Sodium: 135 mmol/L (ref 134–144)
Total Protein: 6.9 g/dL (ref 6.0–8.5)

## 2017-06-03 LAB — CBC WITH DIFFERENTIAL/PLATELET
Basophils Absolute: 0 10*3/uL (ref 0.0–0.2)
Basos: 1 %
EOS (ABSOLUTE): 0 10*3/uL (ref 0.0–0.4)
Eos: 1 %
Hematocrit: 38.1 % (ref 34.0–46.6)
Hemoglobin: 12.7 g/dL (ref 11.1–15.9)
Immature Grans (Abs): 0 10*3/uL (ref 0.0–0.1)
Immature Granulocytes: 0 %
Lymphocytes Absolute: 1.6 10*3/uL (ref 0.7–3.1)
Lymphs: 26 %
MCH: 30 pg (ref 26.6–33.0)
MCHC: 33.3 g/dL (ref 31.5–35.7)
MCV: 90 fL (ref 79–97)
Monocytes Absolute: 0.6 10*3/uL (ref 0.1–0.9)
Monocytes: 9 %
Neutrophils Absolute: 3.9 10*3/uL (ref 1.4–7.0)
Neutrophils: 63 %
Platelets: 296 10*3/uL (ref 150–379)
RBC: 4.23 x10E6/uL (ref 3.77–5.28)
RDW: 14.6 % (ref 12.3–15.4)
WBC: 6.2 10*3/uL (ref 3.4–10.8)

## 2017-06-03 LAB — SEDIMENTATION RATE: Sed Rate: 12 mm/hr (ref 0–40)

## 2017-06-03 LAB — CK: Total CK: 354 U/L — ABNORMAL HIGH (ref 24–173)

## 2017-07-08 ENCOUNTER — Telehealth: Payer: Self-pay | Admitting: Family Medicine

## 2017-07-08 NOTE — Telephone Encounter (Signed)
Pt. Called and given lab results and instructions. Would like a referral to rheumatology. Would like a Friday appointment. Please call her with appointment.

## 2017-07-13 NOTE — Telephone Encounter (Signed)
Referral placed on 06/02/18.  Please check status of referral.

## 2017-07-14 ENCOUNTER — Telehealth: Payer: Self-pay | Admitting: Family Medicine

## 2017-07-14 NOTE — Telephone Encounter (Signed)
Spoke with pt and advised her referral was sent to [facility] and it looks like in Epic they are ready to schedule pt. I gave pt their phone number and she said she would give them the rest of the week to contact her and then she would call if she hasn't heard anything.
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
## 2017-08-23 ENCOUNTER — Encounter: Payer: Self-pay | Admitting: Rheumatology

## 2017-08-23 ENCOUNTER — Ambulatory Visit: Payer: BLUE CROSS/BLUE SHIELD | Admitting: Rheumatology

## 2017-08-23 VITALS — BP 142/82 | HR 71 | Resp 17 | Ht 62.0 in | Wt 200.0 lb

## 2017-08-23 DIAGNOSIS — A6000 Herpesviral infection of urogenital system, unspecified: Secondary | ICD-10-CM

## 2017-08-23 DIAGNOSIS — M5136 Other intervertebral disc degeneration, lumbar region: Secondary | ICD-10-CM | POA: Diagnosis not present

## 2017-08-23 DIAGNOSIS — Z8659 Personal history of other mental and behavioral disorders: Secondary | ICD-10-CM

## 2017-08-23 DIAGNOSIS — M7711 Lateral epicondylitis, right elbow: Secondary | ICD-10-CM

## 2017-08-23 DIAGNOSIS — M791 Myalgia, unspecified site: Secondary | ICD-10-CM | POA: Diagnosis not present

## 2017-08-23 DIAGNOSIS — K760 Fatty (change of) liver, not elsewhere classified: Secondary | ICD-10-CM

## 2017-08-23 DIAGNOSIS — M722 Plantar fascial fibromatosis: Secondary | ICD-10-CM | POA: Diagnosis not present

## 2017-08-23 DIAGNOSIS — R5383 Other fatigue: Secondary | ICD-10-CM | POA: Diagnosis not present

## 2017-08-23 DIAGNOSIS — I10 Essential (primary) hypertension: Secondary | ICD-10-CM

## 2017-08-23 DIAGNOSIS — M5030 Other cervical disc degeneration, unspecified cervical region: Secondary | ICD-10-CM | POA: Diagnosis not present

## 2017-08-23 DIAGNOSIS — R748 Abnormal levels of other serum enzymes: Secondary | ICD-10-CM

## 2017-08-23 DIAGNOSIS — E559 Vitamin D deficiency, unspecified: Secondary | ICD-10-CM | POA: Diagnosis not present

## 2017-08-23 DIAGNOSIS — N3941 Urge incontinence: Secondary | ICD-10-CM

## 2017-08-23 DIAGNOSIS — K219 Gastro-esophageal reflux disease without esophagitis: Secondary | ICD-10-CM

## 2017-08-23 DIAGNOSIS — M7712 Lateral epicondylitis, left elbow: Secondary | ICD-10-CM

## 2017-08-23 NOTE — Patient Instructions (Signed)
Elbow and Forearm Exercises Ask your health care provider which exercises are safe for you. Do exercises exactly as told by your health care provider and adjust them as directed. It is normal to feel mild stretching, pulling, tightness, or discomfort as you do these exercises, but you should stop right away if you feel sudden pain or your pain gets worse.Do not begin these exercises until told by your health care provider. RANGE OF MOTION EXERCISES These exercises warm up your muscles and joints and improve the movement and flexibility of your injured elbow and forearm. These exercises also help to relieve pain, numbness, and tingling.These exercises are done using the muscles in your injured elbow and forearm. Exercise A: Elbow Flexion, Active 1. Hold your left / right arm at your side, and bend your elbow as far as you can using your left / right arm muscles. 2. Hold this position for __________ seconds. 3. Slowly return to the starting position. Repeat __________ times. Complete this exercise __________ times a day. Exercise B: Elbow Extension, Active 1. Hold your left / right arm at your side, and straighten your elbow as much as you can using your left / right arm muscles. 2. Hold this position for __________ seconds. 3. Slowly return to the starting position. Repeat __________ times. Complete this exercise __________ times a day. Exercise C: Forearm Rotation, Supination, Active 1. Stand or sit with your elbows at your sides. 2. Bend your left / right elbow to an "L" shape (90 degrees). 3. Turn your palm upward until you feel a gentle stretch on the inside of your forearm. 4. Hold this position for __________ seconds. 5. Slowly release and return to the starting position. Repeat __________ times. Complete this exercise __________ times a day. Exercise D: Forearm Rotation, Pronation, Active 1. Stand or sit with your elbows at your side. 2. Bend your left / right elbow to an "L" shape (90  degrees). 3. Turn your left / right palm downward until you feel a gentle stretch on the top of your forearm. 4. Hold this position for __________ seconds. 5. Slowly release and return to the starting position. Repeat__________ times. Complete this exercise __________ times a day. STRETCHING EXERCISES These exercises warm up your muscles and joints and improve the movement and flexibility of your injured elbow and forearm. These exercises also help to relieve pain, numbness, and tingling.These exercises are done using your healthy elbow and forearm to help stretch the muscles in your injured elbow and forearm. Exercise E: Elbow Flexion, Active-Assisted  1. Hold your left / right arm at your side, and bend your elbow as much as you can using your left / right arm muscles. 2. Use your other hand to bend your left / right elbow farther. To do this, gently push up on your forearm until you feel a gentle stretch on the back of your elbow. 3. Hold this position for __________ seconds. 4. Slowly return to the starting position. Repeat __________ times. Complete this exercise __________ times a day. Exercise F: Elbow Extension, Active-Assisted  1. Hold your left / right arm at your side, and straighten your elbow as much as you can using your left / right arm muscles. 2. Use your other hand to straighten the left / right elbow farther. To do this, gently push down on your forearm until you feel a gentle stretch on the inside of your elbow. 3. Hold this position for __________ seconds. 4. Slowly return to the starting position. Repeat __________  times. Complete this exercise __________ times a day. Exercise G: Forearm Rotation, Supination, Active-Assisted  1. Sit with your left / right elbow bent in an "L" shape (90 degrees) with your forearm resting on a table. 2. Keeping your upper body and shoulder still, rotate your forearm so your left / right palm faces upward. 3. Use your other hand to help  rotate your forearm further until you feel a gentle to moderate stretch. 4. Hold this position for __________ seconds. 5. Slowly release the stretch and return to the starting position. Repeat __________ times. Complete this exercise __________ times a day. Exercise H: Forearm Rotation, Pronation, Active-Assisted  1. Sit with your left / right elbow bent in an "L" shape (90 degrees) with your forearm resting on a table. 2. Keeping your upper body and shoulder still, rotate your forearm so your palm faces the tabletop. 3. Use your other hand to help rotate your forearm further until you feel a gentle to moderate stretch. 4. Hold this position for __________ seconds. 5. Slowly release the stretch and return to the starting position. Repeat __________ times. Complete this exercise __________ times a day. Exercise I: Elbow Flexion, Supine, Passive 1. Lie on your back. 2. Extend your left / right arm up in the air, bracing it with your other hand. 3. Let your left / right your hand slowly lower toward your shoulder, while your elbow stays pointed toward the ceiling. You should feel a gentle stretch along the back of your upper arm and elbow. 4. If instructed by your health care provider, you may increase the intensity of your stretch by adding a small wrist weight or hand weight. 5. Hold this position for __________ seconds. 6. Slowly return to the starting position. Repeat __________ times. Complete this exercise __________ times a day. Exercise J: Elbow Extension, Supine, Passive  1. Lie on your back. Make sure that you are in a comfortable position that lets you relax your arm muscles. 2. Place a folded towel under your left / right upper arm so your elbow and shoulder are at the same height. Straighten your left / right arm so your elbow does not rest on the bed or towel. 3. Let the weight of your hand stretch your elbow. Keep your arm and chest muscles relaxed. You should feel a stretch on  the inside of your elbow. 4. If told by your health care provider, you may increase the intensity of your stretch by adding a small wrist weight or hand weight. 5. Hold this position for__________ seconds. 6. Slowly release the stretch. Repeat __________ times. Complete this exercise __________ times a day. STRENGTHENING EXERCISES These exercises build strength and endurance in your elbow and forearm. Endurance is the ability to use your muscles for a long time, even after they get tired. Exercise K: Elbow Flexion, Isometric  1. Stand or sit up straight. 2. Bend your left / right elbow in an "L" shape (90 degrees) and turn your palm up so your forearm is at the height of your waist. 3. Place your other hand on top of your forearm. Gently push down as your left / right arm resists. Push as hard as you can with both arms without causing any pain or movement at your left / right elbow. 4. Hold this position for __________ seconds. 5. Slowly release the tension in both arms. Let your muscles relax completely before repeating. Repeat __________ times. Complete this exercise __________ times a day. Exercise L: Elbow Extensors, Isometric  1. Stand or sit up straight. 2. Place your left / right arm so your palm faces your abdomen and it is at the height of your waist. 3. Place your other hand on the underside of your forearm. Gently push up as your left / right arm resists. Push as hard as you can with both arms, without causing any pain or movement at your left / right elbow. 4. Hold this position for __________ seconds. 5. Slowly release the tension in both arms. Let your muscles relax completely before repeating. Repeat __________ times. Complete this exercise __________ times a day. Exercise M: Elbow Flexion With Forearm Palm Up  1. Sit upright on a firm chair without armrests, or stand. 2. Place your left / right arm at your side with your palm facing forward. 3. Holding a __________weight  or gripping a rubber exercise band or tubing, bend your elbow to bring your hand toward your shoulder. 4. Hold this position for __________ seconds. 5. Slowly return to the starting position. Repeat __________times. Complete this exercise __________times a day. Exercise N: Elbow Extension  1. Sit on a firm chair without armrests, or stand. 2. Keeping your upper arms at your sides, bring both hands up toward your left / right shoulder while you grip a rubber exercise band or tubing. Your left / right hand should be just below the other hand. 3. Straighten your left / right elbow. 4. Hold this position for __________ seconds. 5. Control the resistance of the band or tubing as your hand returns to your side. Repeat __________times. Complete this exercise __________times a day. Exercise O: Forearm Rotation, Supination  1. Sit with your left / right forearm supported on a table. Keep your elbow at waist height. 2. Rest your hand over the edge of the table with your palm facing down. 3. Gently hold a lightweight hammer. 4. Without moving your elbow, slowly rotate your forearm to turn your palm and hand upward to a "thumbs-up" position. 5. Hold this position for __________ seconds. 6. Slowly return to the starting position. Repeat __________times. Complete this exercise __________times a day. Exercise P: Forearm Rotation, Pronation  1. Sit with your left / right forearm supported on a table. Keep your elbow below shoulder height. 2. Rest your hand over the edge of the table with your palm facing up. 3. Gently hold a lightweight hammer. 4. Without moving your elbow, slowly rotate your forearm to turn your palm and hand upward to a "thumbs-up" position. 5. Hold this position for __________seconds. 6. Slowly return to the starting position. Repeat __________times. Complete this exercise __________times a day. This information is not intended to replace advice given to you by your health care  provider. Make sure you discuss any questions you have with your health care provider. Document Released: 04/22/2005 Document Revised: 10/17/2015 Document Reviewed: 03/03/2015 Elsevier Interactive Patient Education  2018 [employer]  

## 2017-08-25 LAB — CK: Total CK: 232 U/L — ABNORMAL HIGH (ref 29–143)

## 2017-08-25 LAB — COMPLETE METABOLIC PANEL WITH GFR
AG Ratio: 1.5 (calc) (ref 1.0–2.5)
ALT: 56 U/L — ABNORMAL HIGH (ref 6–29)
AST: 36 U/L — ABNORMAL HIGH (ref 10–35)
Albumin: 4.2 g/dL (ref 3.6–5.1)
Alkaline phosphatase (APISO): 71 U/L (ref 33–130)
BUN/Creatinine Ratio: 8 (calc) (ref 6–22)
BUN: 9 mg/dL (ref 7–25)
CO2: 30 mmol/L (ref 20–32)
Calcium: 9.4 mg/dL (ref 8.6–10.4)
Chloride: 99 mmol/L (ref 98–110)
Creat: 1.08 mg/dL — ABNORMAL HIGH (ref 0.50–1.05)
GFR, Est African American: 66 mL/min/{1.73_m2} (ref >=60)
GFR, Est Non African American: 57 mL/min/{1.73_m2} — ABNORMAL LOW (ref >=60)
Globulin: 2.8 g/dL (calc) (ref 1.9–3.7)
Glucose, Bld: 94 mg/dL (ref 65–99)
Potassium: 4.3 mmol/L (ref 3.5–5.3)
Sodium: 137 mmol/L (ref 135–146)
Total Bilirubin: 0.5 mg/dL (ref 0.2–1.2)
Total Protein: 7 g/dL (ref 6.1–8.1)

## 2017-08-25 LAB — ALDOLASE: Aldolase: 5.5 U/L (ref <=8.1)

## 2017-08-25 LAB — ANA: Anti Nuclear Antibody(ANA): POSITIVE — AB

## 2017-08-25 LAB — SEDIMENTATION RATE: Sed Rate: 11 mm/h (ref 0–30)

## 2017-08-25 LAB — PROTEIN ELECTROPHORESIS, SERUM, WITH REFLEX
Albumin ELP: 4 g/dL (ref 3.8–4.8)
Alpha 1: 0.3 g/dL (ref 0.2–0.3)
Alpha 2: 0.6 g/dL (ref 0.5–0.9)
Beta 2: 0.3 g/dL (ref 0.2–0.5)
Beta Globulin: 0.5 g/dL (ref 0.4–0.6)
Gamma Globulin: 1.1 g/dL (ref 0.8–1.7)
Total Protein: 6.9 g/dL (ref 6.1–8.1)

## 2017-08-25 LAB — TSH: TSH: 1.12 mIU/L (ref 0.40–4.50)

## 2017-08-25 LAB — MAGNESIUM: Magnesium: 2.3 mg/dL (ref 1.5–2.5)

## 2017-08-25 LAB — VITAMIN D 25 HYDROXY (VIT D DEFICIENCY, FRACTURES): Vit D, 25-Hydroxy: 21 ng/mL — ABNORMAL LOW (ref 30–100)

## 2017-08-25 LAB — ANTI-NUCLEAR AB-TITER (ANA TITER): ANA Titer 1: 1:160 {titer} — ABNORMAL HIGH

## 2017-08-25 NOTE — Progress Notes (Signed)
Vitamin D deficiency.  Please call in vitamin D 50,000 units once a week for 3 months.  Will discuss rest of the labs at follow-up visit.

## 2017-08-26 ENCOUNTER — Telehealth: Payer: Self-pay | Admitting: *Deleted

## 2017-08-26 DIAGNOSIS — E559 Vitamin D deficiency, unspecified: Secondary | ICD-10-CM

## 2017-08-26 MED ORDER — VITAMIN D (ERGOCALCIFEROL) 1.25 MG (50000 UNIT) PO CAPS: 50000.0000 [IU] | ORAL_CAPSULE | ORAL | 0 refills | Status: DC

## 2017-08-26 NOTE — Telephone Encounter (Signed)
-----   Message from Bo Merino, MD sent at 08/25/2017 12:18 PM EST ----- Vitamin D deficiency.  Please call in vitamin D 50,000 units once a week for 3 months.  Will discuss rest of the labs at follow-up visit.

## 2017-09-07 ENCOUNTER — Encounter: Payer: Self-pay | Admitting: Family Medicine

## 2017-09-10 ENCOUNTER — Ambulatory Visit: Payer: BLUE CROSS/BLUE SHIELD | Admitting: Physician Assistant

## 2017-09-10 ENCOUNTER — Encounter: Payer: Self-pay | Admitting: Physician Assistant

## 2017-09-10 VITALS — BP 114/78 | HR 81 | Temp 98.2°F | Resp 16 | Ht 62.0 in | Wt 200.0 lb

## 2017-09-10 DIAGNOSIS — N3010 Interstitial cystitis (chronic) without hematuria: Secondary | ICD-10-CM | POA: Diagnosis not present

## 2017-09-10 DIAGNOSIS — R339 Retention of urine, unspecified: Secondary | ICD-10-CM

## 2017-09-10 DIAGNOSIS — R350 Frequency of micturition: Secondary | ICD-10-CM

## 2017-09-10 LAB — POCT URINALYSIS DIP (MANUAL ENTRY)
Bilirubin, UA: NEGATIVE
Blood, UA: NEGATIVE
Glucose, UA: NEGATIVE mg/dL
Ketones, POC UA: NEGATIVE mg/dL
Leukocytes, UA: NEGATIVE
Nitrite, UA: NEGATIVE
Protein Ur, POC: NEGATIVE mg/dL
Spec Grav, UA: 1.015 (ref 1.010–1.025)
Urobilinogen, UA: 0.2 E.U./dL
pH, UA: 6.5 (ref 5.0–8.0)

## 2017-09-10 LAB — POC MICROSCOPIC URINALYSIS (UMFC): Mucus: ABSENT

## 2017-09-10 MED ORDER — HYDROXYZINE HCL 25 MG PO TABS: 25.0000 mg | ORAL_TABLET | Freq: Every day | ORAL | 0 refills | Status: DC

## 2017-09-10 NOTE — Progress Notes (Signed)
Terri Hurley  MRN: [id number] DOB: 24-Mar-1961  PCP: Wardell Honour, MD  Subjective:  Pt is a 57 year old female who presents to clinic for urinary symptoms.   Feels discomfort in her genital region "like I need to go" but she cannot. Feels like a pressure. Denies burning with urination, increased frequency, increased urgency.  H/o multiple UTIs. She used to sleep backwards on the toilet at night when she had UTI's.  Last UTI was about 8 months ago. She has made significant improvements to her diet, including cutting out soda, dark drinks.    Review of Systems  Constitutional: Negative for chills, fatigue and fever.  Respiratory: Negative for cough, shortness of breath and wheezing.   Cardiovascular: Negative for chest pain and palpitations.  Gastrointestinal: Negative for abdominal pain, diarrhea, nausea and vomiting.  Genitourinary: Positive for dysuria ("discomfort"). Negative for decreased urine volume, difficulty urinating, enuresis, flank pain, frequency, hematuria and urgency.  Musculoskeletal: Negative for back pain.  Neurological: Negative for dizziness, weakness, light-headedness and headaches.    Patient Active Problem List   Diagnosis Date Noted  . DDD (degenerative disc disease), cervical 08/23/2017  . DDD (degenerative disc disease), lumbar 08/23/2017  . Essential hypertension 01/28/2016  . Low back pain radiating to left leg 11/19/2011  . Obesity, Class II, BMI 35-39.9 11/18/2011  . Incontinence of urine 12/26/2010  . Arthritis   . Depression   . GERD (gastroesophageal reflux disease)   . Fatty liver disease, nonalcoholic   . HNP (herniated nucleus pulposus), cervical   . Recurrent genital HSV (herpes simplex virus) infection   . Plantar fasciitis   . Elevated liver enzymes 12/12/2010  . Vitamin D deficiency 12/12/2010  . Paresthesia 12/12/2010    Current Outpatient Medications on File Prior to Visit  Medication Sig Dispense Refill  . acyclovir (ZOVIRAX)  400 MG tablet TAKE 1 TABLET BY MOUTH ONCE DAILY 30 tablet 5  . buPROPion (WELLBUTRIN XL) 300 MG 24 hr tablet Take 1 tablet (300 mg total) by mouth daily. 90 tablet 3  . cyclobenzaprine (FLEXERIL) 5 MG tablet Take 1 tablet (5 mg total) by mouth 3 (three) times daily as needed for muscle spasms. 90 tablet 4  . diazepam (VALIUM) 5 MG tablet Take 0.5-1 tablets (2.5-5 mg total) by mouth daily as needed for anxiety. 30 tablet 0  . escitalopram (LEXAPRO) 10 MG tablet Take 1 tablet (10 mg total) by mouth daily. 90 tablet 3  . fluticasone (FLONASE) 50 MCG/ACT nasal spray Place 2 sprays into both nostrils daily. (Patient taking differently: Place 2 sprays into both nostrils as needed. ) 16 g 5  . losartan-hydrochlorothiazide (HYZAAR) 50-12.5 MG tablet Take 1 tablet by mouth daily. 90 tablet 3  . neomycin-polymyxin-hydrocortisone (CORTISPORIN) OTIC solution Place 4 drops into both ears 4 (four) times daily. (Patient taking differently: Place 4 drops into both ears as needed. ) 10 mL 2  . omeprazole (PRILOSEC) 20 MG capsule Take 1 capsule (20 mg total) by mouth daily. 90 capsule 3  . oxybutynin (DITROPAN-XL) 5 MG 24 hr tablet Take 1 tablet (5 mg total) by mouth at bedtime. 30 tablet 5  . senna-docusate (SENOKOT-S) 8.6-50 MG per tablet Take 2 tablets by mouth 2 (two) times daily.     . Vitamin D, Ergocalciferol, (DRISDOL) 50000 units CAPS capsule Take 1 capsule (50,000 Units total) by mouth every 7 (seven) days. 12 capsule 0   No current facility-administered medications on file prior to visit.     Allergies  Allergen Reactions  . Robaxin [Methocarbamol] Rash  . Penicillins      Objective:  BP 114/78   Pulse 81   Temp 98.2 F (36.8 C) (Oral)   Resp 16   Ht 5\' 2"  (1.575 m)   Wt 200 lb (90.7 kg)   SpO2 96%   BMI 36.58 kg/m   Physical Exam  Constitutional: She is oriented to person, place, and time and well-developed, well-nourished, and in no distress. No distress.  Abdominal: Soft. Normal  appearance. There is no tenderness. There is no CVA tenderness.  Neurological: She is alert and oriented to person, place, and time. GCS score is 15.  Skin: Skin is warm and dry.  Psychiatric: Mood, memory, affect and judgment normal.  Vitals reviewed.   Assessment and Plan :  1. Interstitial cystitis - hydrOXYzine (ATARAX/VISTARIL) 25 MG tablet; Take 1-2 tablets (25-50 mg total) by mouth at bedtime.  Dispense: 60 tablet; Refill: 0 - Pt presents for urinary pressure without symptoms of frequency, urgency, dysuria. UA is completely clear. Plan to send culture and treat for interstitial cystitis with Hydroxyzine. RTC in 1 weeks for recheck.  2. Urinary frequency 3. Urinary retention - POCT urinalysis dipstick - POCT Microscopic Urinalysis ([facility]) - Urine Culture   Mercer Pod, PA-C  [facility] 09/10/2017 2:43 PM

## 2017-09-10 NOTE — Patient Instructions (Addendum)
I am treating you today for interstitial cystitis (seee below).  Start taking hydroxyzine 25- 50 mg at bedtime. It is usually well tolerated but may cause sedation or dizziness. Drowsiness is the most common side effect, but this is typically mild. Come back and see me in 1 week. I will call you if bacteria grown on your urine culture.   Follow these instructions at home: - Apply heat or cold over the bladder or perineum. -Avoidance of activities or food or beverages that exacerbate symptoms - Common irritants include caffeine, alcohol, artificial sweeteners, hot pepper, and vitamin C-containing foods. These foods may be avoided until symptoms resolve, at which time they may be reintroduced. - Identify and avoid exercises, recreational activities, sexual activities, or body positions that seem to worsen the bladder symptoms. A symptom diary may be useful for some patients to identify such factors. - Patients who experience worsening of symptoms with concentrated urine may find increasing fluid intake helpful. Others experience pain with bladder filling and may find that moderate fluid restriction provides some relief. Patients should be instructed to avoid extremes of fluid intake. In most patients, it is not necessary to drink more than 2 L of fluid per day. Those who restrict fluids should strive to maintain a pale yellow color to their urine.    Interstitial Cystitis Interstitial cystitis is a condition that causes inflammation of the bladder. The bladder is a hollow organ in the lower part of your abdomen. It stores urine after the urine is made by your kidneys. With interstitial cystitis, you may have pain in the bladder area. You may also have a frequent and urgent need to urinate. The severity of interstitial cystitis can vary from person to person. You may have flare-ups of the condition, and then it may go away for a while. For many people who have this condition, it becomes a long-term  problem. What are the causes? The cause of this condition is not known. What increases the risk? This condition is more likely to develop in women. What are the signs or symptoms? Symptoms of interstitial cystitis vary, and they can change over time. Symptoms may include:  Discomfort or pain in the bladder area. This can range from mild to severe. The pain may change in intensity as the bladder fills with urine or as it empties.  Pelvic pain.  An urgent need to urinate.  Frequent urination.  Pain during sexual intercourse.  Pinpoint bleeding on the bladder wall.  For women, the symptoms often get worse during menstruation.  How is this treated? There is no cure for interstitial cystitis, but treatment methods are available to control your symptoms. Work closely with your health care provider to find the treatments that will be most effective for you. Treatment options may include:  Medicines to relieve pain and to help reduce the number of times that you feel the need to urinate.  Bladder training. This involves learning ways to control when you urinate, such as: ? Urinating at scheduled times. ? Training yourself to delay urination. ? Doing exercises (Kegel exercises) to strengthen the muscles that control urine flow.  Lifestyle changes, such as changing your diet or taking steps to control stress.  Use of a device that provides electrical stimulation in order to reduce pain.  A procedure that stretches your bladder by filling it with air or fluid.  Surgery. This is rare. It is only done for extreme cases if other treatments do not help.  Follow these instructions  at home:  Take medicines only as directed by your health care provider.  Use bladder training techniques as directed. ? Keep a bladder diary to find out which foods, liquids, or activities make your symptoms worse. ? Use your bladder diary to schedule bathroom trips. If you are away from home, plan to be near  a bathroom at each of your scheduled times. ? Make sure you urinate just before you leave the house and just before you go to bed.  Do Kegel exercises as directed by your health care provider.  Do not drink alcohol.  Do not use any tobacco products, including cigarettes, chewing tobacco, or electronic cigarettes. If you need help quitting, ask your health care provider.  Make dietary changes as directed by your health care provider. You may need to avoid spicy foods and foods that contain a high amount of potassium.  Limit your drinking of beverages that stimulate urination. These include soda, coffee, and tea.  Keep all follow-up visits as directed by your health care provider. This is important. Contact a health care provider if:  Your symptoms do not get better after treatment.  Your pain and discomfort are getting worse.  You have more frequent urges to urinate.  You have a fever. Get help right away if:  You are not able to control your bladder at all. This information is not intended to replace advice given to you by your health care provider. Make sure you discuss any questions you have with your health care provider. Document Released: 02/07/2004 Document Revised: 11/14/2015 Document Reviewed: 02/13/2014 Elsevier Interactive Patient Education  2018 [employer].   IF you received an x-ray today, you will receive an invoice from [facility]. Please contact [facility] at [phone] with questions or concerns regarding your invoice.   IF you received labwork today, you will receive an invoice from [facility]. Please contact [facility] at [phone] with questions or concerns regarding your invoice.   Our billing staff will not be able to assist you with questions regarding bills from these companies.  You will be contacted with the lab results as soon as they are available. The fastest way to get your results is to activate your My Chart account.  Instructions are located on the last page of this paperwork. If you have not heard from [place] regarding the results in 2 weeks, please contact this office.

## 2017-09-12 LAB — URINE CULTURE

## 2017-09-17 ENCOUNTER — Ambulatory Visit: Payer: BLUE CROSS/BLUE SHIELD | Admitting: Physician Assistant
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
## 2017-09-24 ENCOUNTER — Other Ambulatory Visit: Payer: Self-pay | Admitting: Family Medicine

## 2017-10-01 ENCOUNTER — Encounter: Payer: Self-pay | Admitting: Rheumatology

## 2017-10-01 ENCOUNTER — Ambulatory Visit: Payer: BLUE CROSS/BLUE SHIELD | Admitting: Rheumatology

## 2017-10-01 VITALS — BP 137/87 | HR 74 | Resp 15 | Ht 62.0 in | Wt 203.0 lb

## 2017-10-01 DIAGNOSIS — K219 Gastro-esophageal reflux disease without esophagitis: Secondary | ICD-10-CM | POA: Diagnosis not present

## 2017-10-01 DIAGNOSIS — M791 Myalgia, unspecified site: Secondary | ICD-10-CM

## 2017-10-01 DIAGNOSIS — E559 Vitamin D deficiency, unspecified: Secondary | ICD-10-CM

## 2017-10-01 DIAGNOSIS — M5136 Other intervertebral disc degeneration, lumbar region: Secondary | ICD-10-CM

## 2017-10-01 DIAGNOSIS — R5383 Other fatigue: Secondary | ICD-10-CM

## 2017-10-01 DIAGNOSIS — F5101 Primary insomnia: Secondary | ICD-10-CM

## 2017-10-01 DIAGNOSIS — M19042 Primary osteoarthritis, left hand: Secondary | ICD-10-CM

## 2017-10-01 DIAGNOSIS — R748 Abnormal levels of other serum enzymes: Secondary | ICD-10-CM

## 2017-10-01 DIAGNOSIS — M19041 Primary osteoarthritis, right hand: Secondary | ICD-10-CM | POA: Diagnosis not present

## 2017-10-01 DIAGNOSIS — M5030 Other cervical disc degeneration, unspecified cervical region: Secondary | ICD-10-CM | POA: Diagnosis not present

## 2017-10-01 DIAGNOSIS — R7689 Other specified abnormal immunological findings in serum: Secondary | ICD-10-CM

## 2017-10-01 DIAGNOSIS — I10 Essential (primary) hypertension: Secondary | ICD-10-CM | POA: Diagnosis not present

## 2017-10-01 DIAGNOSIS — K760 Fatty (change of) liver, not elsewhere classified: Secondary | ICD-10-CM

## 2017-10-01 DIAGNOSIS — M51369 Other intervertebral disc degeneration, lumbar region without mention of lumbar back pain or lower extremity pain: Secondary | ICD-10-CM

## 2017-10-01 DIAGNOSIS — R768 Other specified abnormal immunological findings in serum: Secondary | ICD-10-CM | POA: Diagnosis not present

## 2017-10-01 DIAGNOSIS — E669 Obesity, unspecified: Secondary | ICD-10-CM | POA: Diagnosis not present

## 2017-10-01 DIAGNOSIS — E66812 Obesity, class 2: Secondary | ICD-10-CM

## 2017-10-04 LAB — RNP ANTIBODY: Ribonucleic Protein(ENA) Antibody, IgG: <1 AI

## 2017-10-04 LAB — C3 AND C4
C3 Complement: 125 mg/dL (ref 83–193)
C4 Complement: 14 mg/dL — ABNORMAL LOW (ref 15–57)

## 2017-10-04 LAB — ANTI-SCLERODERMA ANTIBODY: Scleroderma (Scl-70) (ENA) Antibody, IgG: <1 AI

## 2017-10-04 LAB — SJOGRENS SYNDROME-A EXTRACTABLE NUCLEAR ANTIBODY: SSA (Ro) (ENA) Antibody, IgG: <1 AI

## 2017-10-04 LAB — CK: Total CK: 281 U/L — ABNORMAL HIGH (ref 29–143)

## 2017-10-04 LAB — ANTI-DNA ANTIBODY, DOUBLE-STRANDED: ds DNA Ab: 12 IU/mL — ABNORMAL HIGH

## 2017-10-04 LAB — SJOGRENS SYNDROME-B EXTRACTABLE NUCLEAR ANTIBODY: SSB (La) (ENA) Antibody, IgG: <1 AI

## 2017-10-04 LAB — ANTI-SMITH ANTIBODY: ENA SM Ab Ser-aCnc: <1 AI

## 2017-10-05 NOTE — Progress Notes (Signed)
Will discuss at the fu visit

## 2017-10-06 ENCOUNTER — Ambulatory Visit: Payer: BLUE CROSS/BLUE SHIELD | Admitting: Family Medicine

## 2017-11-07 ENCOUNTER — Other Ambulatory Visit: Payer: Self-pay | Admitting: Physician Assistant

## 2017-11-07 DIAGNOSIS — N3010 Interstitial cystitis (chronic) without hematuria: Secondary | ICD-10-CM

## 2017-11-08 NOTE — Telephone Encounter (Signed)
Patient requesting refill on Hydroxyzine 25 mg. Please advise, thank you.

## 2017-11-15 ENCOUNTER — Encounter: Payer: Self-pay | Admitting: Family Medicine

## 2017-11-18 ENCOUNTER — Telehealth: Payer: Self-pay | Admitting: Family Medicine

## 2017-11-18 NOTE — Telephone Encounter (Signed)
I left a voicemail in regards to cancelling/rescheduling her appt with Dr. Tamala Julian on 03/09/2018 due to provider leaving.

## 2017-11-22 ENCOUNTER — Encounter: Payer: Self-pay | Admitting: Family Medicine

## 2017-11-22 ENCOUNTER — Ambulatory Visit: Payer: BLUE CROSS/BLUE SHIELD | Admitting: Family Medicine

## 2017-11-22 ENCOUNTER — Other Ambulatory Visit: Payer: Self-pay

## 2017-11-22 VITALS — BP 130/82 | HR 85 | Temp 98.0°F | Resp 16 | Ht 61.81 in | Wt 204.0 lb

## 2017-11-22 DIAGNOSIS — E7800 Pure hypercholesterolemia, unspecified: Secondary | ICD-10-CM | POA: Diagnosis not present

## 2017-11-22 DIAGNOSIS — F329 Major depressive disorder, single episode, unspecified: Secondary | ICD-10-CM

## 2017-11-22 DIAGNOSIS — F331 Major depressive disorder, recurrent, moderate: Secondary | ICD-10-CM

## 2017-11-22 DIAGNOSIS — E559 Vitamin D deficiency, unspecified: Secondary | ICD-10-CM

## 2017-11-22 DIAGNOSIS — I10 Essential (primary) hypertension: Secondary | ICD-10-CM

## 2017-11-22 DIAGNOSIS — R0981 Nasal congestion: Secondary | ICD-10-CM

## 2017-11-22 DIAGNOSIS — M797 Fibromyalgia: Secondary | ICD-10-CM

## 2017-11-22 DIAGNOSIS — F419 Anxiety disorder, unspecified: Secondary | ICD-10-CM

## 2017-11-22 DIAGNOSIS — K760 Fatty (change of) liver, not elsewhere classified: Secondary | ICD-10-CM | POA: Diagnosis not present

## 2017-11-22 DIAGNOSIS — K219 Gastro-esophageal reflux disease without esophagitis: Secondary | ICD-10-CM

## 2017-11-22 DIAGNOSIS — F32A Depression, unspecified: Secondary | ICD-10-CM

## 2017-11-22 DIAGNOSIS — A6000 Herpesviral infection of urogenital system, unspecified: Secondary | ICD-10-CM

## 2017-11-22 MED ORDER — MELOXICAM 7.5 MG PO TABS: 7.5000 mg | ORAL_TABLET | Freq: Every day | ORAL | 1 refills | Status: DC

## 2017-11-22 MED ORDER — DIAZEPAM 5 MG PO TABS: 2.5000 mg | ORAL_TABLET | Freq: Every day | ORAL | 0 refills | Status: DC | PRN

## 2017-11-22 MED ORDER — NYSTATIN 100000 UNIT/GM EX CREA: 1.0000 "application " | TOPICAL_CREAM | Freq: Two times a day (BID) | CUTANEOUS | 0 refills | Status: DC

## 2017-11-22 MED ORDER — ESCITALOPRAM OXALATE 10 MG PO TABS: 10.0000 mg | ORAL_TABLET | Freq: Every day | ORAL | 1 refills | Status: DC

## 2017-11-22 MED ORDER — ACYCLOVIR 400 MG PO TABS: 400.0000 mg | ORAL_TABLET | Freq: Every day | ORAL | 1 refills | Status: DC

## 2017-11-22 MED ORDER — BUPROPION HCL ER (XL) 150 MG PO TB24: 150.0000 mg | ORAL_TABLET | Freq: Every day | ORAL | 1 refills | Status: DC

## 2017-11-22 MED ORDER — LOSARTAN POTASSIUM-HCTZ 50-12.5 MG PO TABS: 1.0000 | ORAL_TABLET | Freq: Every day | ORAL | 1 refills | Status: DC

## 2017-11-22 MED ORDER — FLUTICASONE PROPIONATE 50 MCG/ACT NA SUSP: 2.0000 | Freq: Every day | NASAL | 3 refills | Status: DC

## 2017-11-22 MED ORDER — OMEPRAZOLE 20 MG PO CPDR: 20.0000 mg | DELAYED_RELEASE_CAPSULE | Freq: Every day | ORAL | 3 refills | Status: DC

## 2017-11-22 MED ORDER — GABAPENTIN 300 MG PO CAPS: 300.0000 mg | ORAL_CAPSULE | Freq: Every day | ORAL | 1 refills | Status: DC

## 2017-11-22 NOTE — Patient Instructions (Addendum)
   IF you received an x-ray today, you will receive an invoice from [facility]. Please contact  Radiology at [phone] with questions or concerns regarding your invoice.   IF you received labwork today, you will receive an invoice from [facility]. Please contact [facility] at [phone] with questions or concerns regarding your invoice.   Our billing staff will not be able to assist you with questions regarding bills from these companies.  You will be contacted with the lab results as soon as they are available. The fastest way to get your results is to activate your My Chart account. Instructions are located on the last page of this paperwork. If you have not heard from us regarding the results in 2 weeks, please contact this office.     Restless Legs Syndrome Restless legs syndrome is a condition that causes uncomfortable feelings or sensations in the legs, especially while sitting or lying down. The sensations usually cause an overwhelming urge to move the legs. The arms can also sometimes be affected. The condition can range from mild to severe. The symptoms often interfere with a person's ability to sleep. What are the causes? The cause of this condition is not known. What increases the risk? This condition is more likely to develop in:  People who are older than age 50.  Pregnant women. In general, restless legs syndrome is more common in women than in men.  People who have a family history of the condition.  People who have certain medical conditions, such as iron deficiency, kidney disease, Parkinson disease, or nerve damage.  People who take certain medicines, such as medicines for high blood pressure, nausea, colds, allergies, depression, and some heart conditions.  What are the signs or symptoms? The main symptom of this condition is uncomfortable sensations in the legs. These sensations may be:  Described as pulling, tingling, prickling, throbbing,  crawling, or burning.  Worse while you are sitting or lying down.  Worse during periods of rest or inactivity.  Worse at night, often interfering with your sleep.  Accompanied by a very strong urge to move your legs.  Temporarily relieved by movement of your legs.  The sensations usually affect both sides of the body. The arms can also be affected, but this is rare. People who have this condition often have tiredness during the day because of their lack of sleep at night. How is this diagnosed? This condition may be diagnosed based on your description of the symptoms. You may also have tests, including blood tests, to check for other conditions that may lead to your symptoms. In some cases, you may be asked to spend some time in a sleep lab so your sleeping can be monitored. How is this treated? Treatment for this condition is focused on managing the symptoms. Treatment may include:  Self-help and lifestyle changes.  Medicines.  Follow these instructions at home:  Take medicines only as directed by your health care provider.  Try these methods to get temporary relief from the uncomfortable sensations: ? Massage your legs. ? Walk or stretch. ? Take a cold or hot bath.  Practice good sleep habits. For example, go to bed and get up at the same time every day.  Exercise regularly.  Practice ways of relaxing, such as yoga or meditation.  Avoid caffeine and alcohol.  Do not use any tobacco products, including cigarettes, chewing tobacco, or electronic cigarettes. If you need help quitting, ask your health care provider.  Keep all follow-up visits   as directed by your health care provider. This is important. Contact a health care provider if: Your symptoms do not improve with treatment, or they get worse. This information is not intended to replace advice given to you by your health care provider. Make sure you discuss any questions you have with your health care  provider. Document Released: 05/29/2002 Document Revised: 11/14/2015 Document Reviewed: 06/04/2014 Elsevier Interactive Patient Education  2018 Elsevier Inc.  
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
## 2017-11-23 LAB — CBC WITH DIFFERENTIAL/PLATELET
Basophils Absolute: 0 10*3/uL (ref 0.0–0.2)
Basos: 1 %
EOS (ABSOLUTE): 0.1 10*3/uL (ref 0.0–0.4)
Eos: 2 %
Hematocrit: 38 % (ref 34.0–46.6)
Hemoglobin: 12.6 g/dL (ref 11.1–15.9)
Immature Grans (Abs): 0 10*3/uL (ref 0.0–0.1)
Immature Granulocytes: 0 %
Lymphocytes Absolute: 1.5 10*3/uL (ref 0.7–3.1)
Lymphs: 24 %
MCH: 29.6 pg (ref 26.6–33.0)
MCHC: 33.2 g/dL (ref 31.5–35.7)
MCV: 89 fL (ref 79–97)
Monocytes Absolute: 0.6 10*3/uL (ref 0.1–0.9)
Monocytes: 10 %
Neutrophils Absolute: 4 10*3/uL (ref 1.4–7.0)
Neutrophils: 63 %
Platelets: 301 10*3/uL (ref 150–450)
RBC: 4.26 x10E6/uL (ref 3.77–5.28)
RDW: 14.9 % (ref 12.3–15.4)
WBC: 6.3 10*3/uL (ref 3.4–10.8)

## 2017-11-23 LAB — COMPREHENSIVE METABOLIC PANEL
ALT: 56 IU/L — ABNORMAL HIGH (ref 0–32)
AST: 46 IU/L — ABNORMAL HIGH (ref 0–40)
Albumin/Globulin Ratio: 1.5 (ref 1.2–2.2)
Albumin: 4.1 g/dL (ref 3.5–5.5)
Alkaline Phosphatase: 94 IU/L (ref 39–117)
BUN/Creatinine Ratio: 6 — ABNORMAL LOW (ref 9–23)
BUN: 6 mg/dL (ref 6–24)
Bilirubin Total: 0.2 mg/dL (ref 0.0–1.2)
CO2: 24 mmol/L (ref 20–29)
Calcium: 9.5 mg/dL (ref 8.7–10.2)
Chloride: 101 mmol/L (ref 96–106)
Creatinine, Ser: 1.05 mg/dL — ABNORMAL HIGH (ref 0.57–1.00)
GFR calc Af Amer: 68 mL/min/{1.73_m2} (ref >59)
GFR calc non Af Amer: 59 mL/min/{1.73_m2} — ABNORMAL LOW (ref >59)
Globulin, Total: 2.8 g/dL (ref 1.5–4.5)
Glucose: 104 mg/dL — ABNORMAL HIGH (ref 65–99)
Potassium: 3.9 mmol/L (ref 3.5–5.2)
Sodium: 142 mmol/L (ref 134–144)
Total Protein: 6.9 g/dL (ref 6.0–8.5)

## 2017-11-23 LAB — LIPID PANEL
Chol/HDL Ratio: 3.7 ratio (ref 0.0–4.4)
Cholesterol, Total: 205 mg/dL — ABNORMAL HIGH (ref 100–199)
HDL: 55 mg/dL (ref >39)
LDL Calculated: 108 mg/dL — ABNORMAL HIGH (ref 0–99)
Triglycerides: 212 mg/dL — ABNORMAL HIGH (ref 0–149)
VLDL Cholesterol Cal: 42 mg/dL — ABNORMAL HIGH (ref 5–40)

## 2017-12-06 ENCOUNTER — Encounter: Payer: Self-pay | Admitting: Family Medicine
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
## 2017-12-27 ENCOUNTER — Emergency Department (HOSPITAL_COMMUNITY)
Admission: EM | Admit: 2017-12-27 | Discharge: 2017-12-27 | Disposition: A | Discharge status: Home / Self Care | Payer: BLUE CROSS/BLUE SHIELD | Attending: Emergency Medicine | Admitting: Emergency Medicine

## 2017-12-27 ENCOUNTER — Other Ambulatory Visit: Payer: Self-pay

## 2017-12-27 ENCOUNTER — Emergency Department (HOSPITAL_COMMUNITY): Payer: BLUE CROSS/BLUE SHIELD

## 2017-12-27 ENCOUNTER — Encounter (HOSPITAL_COMMUNITY): Payer: Self-pay | Admitting: Emergency Medicine

## 2017-12-27 DIAGNOSIS — W260XXA Contact with knife, initial encounter: Secondary | ICD-10-CM | POA: Diagnosis not present

## 2017-12-27 DIAGNOSIS — Z23 Encounter for immunization: Secondary | ICD-10-CM | POA: Diagnosis not present

## 2017-12-27 DIAGNOSIS — Z79899 Other long term (current) drug therapy: Secondary | ICD-10-CM | POA: Insufficient documentation

## 2017-12-27 DIAGNOSIS — Y93H9 Activity, other involving exterior property and land maintenance, building and construction: Secondary | ICD-10-CM | POA: Diagnosis not present

## 2017-12-27 DIAGNOSIS — I959 Hypotension, unspecified: Secondary | ICD-10-CM | POA: Diagnosis not present

## 2017-12-27 DIAGNOSIS — S61511A Laceration without foreign body of right wrist, initial encounter: Secondary | ICD-10-CM

## 2017-12-27 DIAGNOSIS — Y998 Other external cause status: Secondary | ICD-10-CM | POA: Diagnosis not present

## 2017-12-27 DIAGNOSIS — Y92009 Unspecified place in unspecified non-institutional (private) residence as the place of occurrence of the external cause: Secondary | ICD-10-CM | POA: Diagnosis not present

## 2017-12-27 DIAGNOSIS — R58 Hemorrhage, not elsewhere classified: Secondary | ICD-10-CM | POA: Diagnosis not present

## 2017-12-27 DIAGNOSIS — I10 Essential (primary) hypertension: Secondary | ICD-10-CM | POA: Insufficient documentation

## 2017-12-27 DIAGNOSIS — R200 Anesthesia of skin: Secondary | ICD-10-CM | POA: Diagnosis not present

## 2017-12-27 MED ORDER — LIDOCAINE-EPINEPHRINE (PF) 2 %-1:200000 IJ SOLN
20.0000 mL | Freq: Once | INTRAMUSCULAR | Status: AC
  Administered 2017-12-27: 20 mL
  Filled 2017-12-27: qty 20

## 2017-12-27 MED ORDER — BACITRACIN ZINC 500 UNIT/GM EX OINT
TOPICAL_OINTMENT | Freq: Two times a day (BID) | CUTANEOUS | Status: DC
  Administered 2017-12-27: 1 via TOPICAL
  Filled 2017-12-27: qty 0.9

## 2017-12-27 MED ORDER — TETANUS-DIPHTH-ACELL PERTUSSIS 5-2.5-18.5 LF-MCG/0.5 IM SUSP
0.5000 mL | Freq: Once | INTRAMUSCULAR | Status: AC
  Administered 2017-12-27: 0.5 mL via INTRAMUSCULAR
  Filled 2017-12-27: qty 0.5

## 2017-12-27 NOTE — ED Provider Notes (Signed)
[facility] DEPT Provider Note   CSN: [id number] Arrival date & time: 12/27/17  1638     History   Chief Complaint Chief Complaint  Patient presents with  . Laceration    HPI Terri Hurley is a 57 y.o. female.  HPI Patient states she was cutting a lattice with a utility night when she slipped and cut the dorsum of the right wrist.  Since she lost significant amount of blood.  Bleeding is now controlled.  She complains of mild sensation loss to the dorsum of the hand near the first through third digits.  Denies having any decreased range of motion.  Denies lightheadedness or dizziness.  Unsure last tetanus shot. Past Medical History:  Diagnosis Date  . Arthritis    low back, hip, hands  . Depression    Sees [name], NP @ [facility]  counseling center.Marland Kitchen Has h/o hospitalization  . Fatty liver disease, nonalcoholic    clinical diagnosis by endocrinologist  . GERD (gastroesophageal reflux disease)    controlled with PPI therapy  . Hepatitis unknown type   . High cholesterol    No medical therapy. Last lab March '12  LDL 91, T. Chol 170. Minimal elevation in '08  . HNP (herniated nucleus pulposus), cervical    C6-7. Has had PT, no surgery  . Plantar fasciitis   . Recurrent genital HSV (herpes simplex virus) infection    on daily suppression with acyclovir  . Urinary incontinence   . Vitamin D deficiency    lab '09  Vit D = 19    Patient Active Problem List   Diagnosis Date Noted  . Primary osteoarthritis of both hands 10/01/2017  . DDD (degenerative disc disease), cervical 08/23/2017  . DDD (degenerative disc disease), lumbar 08/23/2017  . Essential hypertension 01/28/2016  . Low back pain radiating to left leg 11/19/2011  . Obesity, Class II, BMI 35-39.9 11/18/2011  . Incontinence of urine 12/26/2010  . Arthritis   . Depression   . GERD (gastroesophageal reflux disease)   . Fatty liver disease, nonalcoholic   . HNP (herniated  nucleus pulposus), cervical   . Recurrent genital HSV (herpes simplex virus) infection   . Plantar fasciitis   . Elevated liver enzymes 12/12/2010  . Vitamin D deficiency 12/12/2010  . Paresthesia 12/12/2010    Past Surgical History:  Procedure Laterality Date  . CHOLECYSTECTOMY    . INCONTINENCE SURGERY  2009  . SPINE SURGERY  05/2014   Cervical spine surgery C4-5, C5-6.  [place].  Marland Kitchen TOTAL ABDOMINAL HYSTERECTOMY W/ BILATERAL SALPINGOOPHORECTOMY  2009   with repair of cystocele and rectocele      OB History   None      Home Medications    Prior to Admission medications   Medication Sig Start Date End Date Taking? Authorizing Provider  acyclovir (ZOVIRAX) 400 MG tablet Take 1 tablet (400 mg total) by mouth daily. 11/22/17  Yes Wardell Honour, MD  buPROPion (WELLBUTRIN XL) 150 MG 24 hr tablet Take 1 tablet (150 mg total) by mouth daily. 11/22/17  Yes Wardell Honour, MD  diazepam (VALIUM) 5 MG tablet Take 0.5-1 tablets (2.5-5 mg total) by mouth daily as needed for anxiety. 11/22/17  Yes Wardell Honour, MD  escitalopram (LEXAPRO) 10 MG tablet Take 1 tablet (10 mg total) by mouth daily. 11/22/17  Yes Wardell Honour, MD  fluticasone Asencion Islam) 50 MCG/ACT nasal spray Place 2 sprays into both nostrils daily. 11/22/17  Yes Reginia Forts  M, MD  gabapentin (NEURONTIN) 300 MG capsule Take 1-2 capsules (300-600 mg total) by mouth at bedtime. 11/22/17  Yes Wardell Honour, MD  hydrOXYzine (ATARAX/VISTARIL) 25 MG tablet TAKE 1 TO 2 TABLETS BY MOUTH AT BEDTIME 11/09/17  Yes Wardell Honour, MD  losartan-hydrochlorothiazide (HYZAAR) 50-12.5 MG tablet Take 1 tablet by mouth daily. 11/22/17  Yes Wardell Honour, MD  meloxicam (MOBIC) 7.5 MG tablet Take 1 tablet (7.5 mg total) by mouth daily. 11/22/17  Yes Wardell Honour, MD  omeprazole (PRILOSEC) 20 MG capsule Take 1 capsule (20 mg total) by mouth daily. 11/22/17  Yes Wardell Honour, MD  senna-docusate (SENOKOT-S) 8.6-50 MG per tablet Take 2 tablets by mouth 2  (two) times daily.    Yes [provider]  neomycin-polymyxin-hydrocortisone (CORTISPORIN) OTIC solution Place 4 drops into both ears 4 (four) times daily. Patient taking differently: Place 4 drops into both ears as needed.  03/03/17   Wardell Honour, MD  nystatin cream (MYCOSTATIN) Apply 1 application topically 2 (two) times daily. 11/22/17   Wardell Honour, MD    Family History Family History  Problem Relation Age of Onset  . Heart disease Mother 71       AMI  . Diabetes Mother        peripheral neuropathy  . Hypertension Mother   . Hyperlipidemia Mother   . Mental illness Mother        depression and anxiety  . Gout Mother   . Diabetes Father   . Heart disease Father   . Hyperlipidemia Father   . Mental illness Father        depression and anxiety  . Heart disease Brother        arrythmia, DCC  . Hyperlipidemia Brother   . Mental illness Brother   . Cancer Sister 3       breast cancer  . Mental illness Sister   . Asthma Sister   . Thyroid disease Sister   . COPD Sister   . Mental illness Sister   . COPD Brother   . Stroke Brother   . Hyperlipidemia Brother   . Hypertension Brother   . Mental illness Brother   . Mental illness Sister   . Mental illness Brother   . Mental illness Brother   . GER disease Son     Social History Social History   Tobacco Use  . Smoking status: Never Smoker  . Smokeless tobacco: Never Used  Substance Use Topics  . Alcohol use: No  . Drug use: No     Allergies   Robaxin [methocarbamol] and Penicillins   Review of Systems Review of Systems  Constitutional: Negative for chills and fever.  Eyes: Negative for visual disturbance.  Respiratory: Negative for shortness of breath.   Cardiovascular: Negative for chest pain and palpitations.  Gastrointestinal: Negative for abdominal pain, nausea and vomiting.  Musculoskeletal: Negative for arthralgias, back pain, myalgias and neck pain.  Skin: Positive for wound.    Neurological: Positive for numbness. Negative for dizziness, weakness, light-headedness and headaches.  All other systems reviewed and are negative.    Physical Exam Updated Vital Signs BP (!) 103/58   Pulse 67   Temp 98 F (36.7 C) (Oral)   Resp 18   SpO2 97%   Physical Exam  Constitutional: She is oriented to person, place, and time. She appears well-developed and well-nourished. No distress.  HENT:  Head: Normocephalic and atraumatic.  Mouth/Throat: Oropharynx is clear and  moist. No oropharyngeal exudate.  Eyes: Pupils are equal, round, and reactive to light. EOM are normal.  Neck: Normal range of motion. Neck supple. No JVD present.  Cardiovascular: Normal rate and regular rhythm. Exam reveals no gallop and no friction rub.  No murmur heard. Pulmonary/Chest: Effort normal and breath sounds normal. No stridor. No respiratory distress. She has no wheezes. She has no rales. She exhibits no tenderness.  Abdominal: Soft. Bowel sounds are normal. There is no tenderness. There is no rebound and no guarding.  Musculoskeletal: Normal range of motion. She exhibits no edema or tenderness.  Full range of motion of the right elbow and right wrist.  Full extension of the digits of the right hand.  Lymphadenopathy:    She has no cervical adenopathy.  Neurological: She is alert and oriented to person, place, and time.  Mildly diminished sensation over the dorsum of the right hand over the metacarpals of the first through third digits.  Good grip strength, wrist and digit extension and finger abduction.  Skin: Skin is warm and dry. Capillary refill takes less than 2 seconds. No rash noted. She is not diaphoretic. No erythema.  Patient has a 2 cm linear laceration just proximal to the right wrist.  Blood clot in place.  No active bleeding.  No obvious deformity or foreign bodies.  Psychiatric: She has a normal mood and affect. Her behavior is normal.  Nursing note and vitals reviewed.    ED  Treatments / Results  Labs (all labs ordered are listed, but only abnormal results are displayed) Labs Reviewed - No data to display  EKG None  Radiology Dg Wrist Complete Right  Result Date: 12/27/2017 CLINICAL DATA:  Patient cut right wrist with utility knife today. Laceration along the radial aspect of right wrist running perpendicular to bone. EXAM: RIGHT WRIST - COMPLETE 3+ VIEW COMPARISON:  None. FINDINGS: Soft tissue bandaging is noted along the radial aspect of the distal forearm and wrist limiting soft tissue assessment. There is suggestion of soft tissue laceration along the radial aspect of the wrist. No radiopaque foreign body nor osseous involvement is seen. Carpal rows are maintained. IMPRESSION: Soft tissue laceration along the radial aspect of the wrist slightly limited in assessment due to overlying bandaging. No underlying osseous involvement nor radiopaque foreign body is seen. Electronically Signed   By: Ashley Royalty M.D.   On: 12/27/2017 18:31    Procedures Procedures (including critical care time)  Medications Ordered in ED Medications  bacitracin ointment (has no administration in time range)  Tdap (BOOSTRIX) injection 0.5 mL (0.5 mLs Intramuscular Given 12/27/17 1814)  lidocaine-EPINEPHrine (XYLOCAINE W/EPI) 2 %-1:200000 (PF) injection 20 mL (20 mLs Infiltration Given by Other 12/27/17 2037)     Initial Impression / Assessment and Plan / ED Course  I have reviewed the triage vital signs and the nursing notes.  Pertinent labs & imaging results that were available during my care of the patient were reviewed by me and considered in my medical decision making (see chart for details).     Evaluated by hand surgery, Dr. Grandville Silos.  Will follow-up as an outpatient.  Couture, PA-C repaired laceration in the emergency department.  Please see her procedure note.  Final Clinical Impressions(s) / ED Diagnoses   Final diagnoses:  Wrist laceration, right, initial encounter     ED Discharge Orders    None       Julianne Rice, MD 12/27/17 2148

## 2017-12-27 NOTE — ED Notes (Signed)
Patient transported to X-ray 
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
## 2017-12-27 NOTE — Discharge Instructions (Addendum)
You will need to have your sutures removed in 1 week either by the hand surgeon or by your prior physician.

## 2017-12-27 NOTE — ED Triage Notes (Signed)
Per EMS pt has laceration to right wrist post cutting lettuce; pressure applied with gauze; "blood squirting and about a pint of blood loss." Some bleeding still present.

## 2017-12-27 NOTE — ED Provider Notes (Signed)
Procedures .Marland KitchenLaceration Repair Date/Time: 12/27/2017 9:10 PM Performed by: Rodney Booze, PA-C Authorized by: Rodney Booze, PA-C   Consent:    Consent obtained:  Verbal   Consent given by:  Patient   Risks discussed:  Infection and pain   Alternatives discussed:  No treatment Anesthesia (see MAR for exact dosages):    Anesthesia method:  Local infiltration   Local anesthetic:  Lidocaine 2% w/o epi Laceration details:    Location: right wrist.   Wound length (cm): 3. Repair type:    Repair type:  Simple Pre-procedure details:    Preparation:  Patient was prepped and draped in usual sterile fashion Exploration:    Hemostasis achieved with:  Direct pressure   Wound exploration: wound explored through full range of motion and entire depth of wound probed and visualized     Contaminated: no   Treatment:    Area cleansed with:  Betadine and saline   Amount of cleaning:  Extensive   Irrigation solution:  Sterile saline   Irrigation volume:  1L   Irrigation method:  Pressure wash   Visualized foreign bodies/material removed: no   Skin repair:    Repair method:  Sutures   Suture size:  5-0   Suture material:  Prolene   Suture technique:  Simple interrupted   Number of sutures:  5 Approximation:    Approximation:  Close Post-procedure details:    Dressing:  Antibiotic ointment, non-adherent dressing and tube gauze   Patient tolerance of procedure:  Tolerated well, no immediate complications     Bishop Dublin 12/27/17 2112    Julianne Rice, MD 12/27/17 2322
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
## 2017-12-27 NOTE — ED Notes (Signed)
ED Provider at bedside for suturing.  

## 2018-01-04 ENCOUNTER — Encounter: Payer: Self-pay | Admitting: Family Medicine

## 2018-01-04 NOTE — Telephone Encounter (Signed)
Sent to referrals

## 2018-01-07 ENCOUNTER — Encounter: Payer: Self-pay | Admitting: Rheumatology

## 2018-01-07 ENCOUNTER — Ambulatory Visit: Payer: BLUE CROSS/BLUE SHIELD | Admitting: Rheumatology

## 2018-01-07 VITALS — BP 113/72 | HR 67 | Resp 14 | Ht 62.0 in | Wt 204.0 lb

## 2018-01-07 DIAGNOSIS — M5030 Other cervical disc degeneration, unspecified cervical region: Secondary | ICD-10-CM

## 2018-01-07 DIAGNOSIS — M19041 Primary osteoarthritis, right hand: Secondary | ICD-10-CM

## 2018-01-07 DIAGNOSIS — R748 Abnormal levels of other serum enzymes: Secondary | ICD-10-CM | POA: Diagnosis not present

## 2018-01-07 DIAGNOSIS — E669 Obesity, unspecified: Secondary | ICD-10-CM

## 2018-01-07 DIAGNOSIS — E559 Vitamin D deficiency, unspecified: Secondary | ICD-10-CM | POA: Diagnosis not present

## 2018-01-07 DIAGNOSIS — M5136 Other intervertebral disc degeneration, lumbar region: Secondary | ICD-10-CM | POA: Diagnosis not present

## 2018-01-07 DIAGNOSIS — K760 Fatty (change of) liver, not elsewhere classified: Secondary | ICD-10-CM | POA: Diagnosis not present

## 2018-01-07 DIAGNOSIS — M797 Fibromyalgia: Secondary | ICD-10-CM

## 2018-01-07 DIAGNOSIS — R5383 Other fatigue: Secondary | ICD-10-CM | POA: Diagnosis not present

## 2018-01-07 DIAGNOSIS — M19042 Primary osteoarthritis, left hand: Secondary | ICD-10-CM

## 2018-01-07 DIAGNOSIS — K219 Gastro-esophageal reflux disease without esophagitis: Secondary | ICD-10-CM | POA: Diagnosis not present

## 2018-01-07 DIAGNOSIS — F5101 Primary insomnia: Secondary | ICD-10-CM | POA: Diagnosis not present

## 2018-01-07 DIAGNOSIS — R768 Other specified abnormal immunological findings in serum: Secondary | ICD-10-CM

## 2018-01-07 DIAGNOSIS — I10 Essential (primary) hypertension: Secondary | ICD-10-CM

## 2018-01-07 NOTE — Patient Instructions (Signed)
[facility] labs in 3 months

## 2018-02-05 ENCOUNTER — Encounter: Payer: Self-pay | Admitting: Family Medicine

## 2018-02-05 ENCOUNTER — Ambulatory Visit: Payer: BLUE CROSS/BLUE SHIELD | Admitting: Family Medicine

## 2018-02-05 ENCOUNTER — Other Ambulatory Visit: Payer: Self-pay

## 2018-02-05 VITALS — BP 132/83 | HR 85 | Temp 98.4°F | Resp 16 | Ht 62.0 in | Wt 201.6 lb

## 2018-02-05 DIAGNOSIS — N390 Urinary tract infection, site not specified: Secondary | ICD-10-CM | POA: Diagnosis not present

## 2018-02-05 DIAGNOSIS — R300 Dysuria: Secondary | ICD-10-CM

## 2018-02-05 LAB — POCT URINALYSIS DIP (MANUAL ENTRY)
Glucose, UA: NEGATIVE mg/dL
Nitrite, UA: NEGATIVE
Protein Ur, POC: 100 mg/dL — AB
Spec Grav, UA: 1.025 (ref 1.010–1.025)
Urobilinogen, UA: 0.2 E.U./dL
pH, UA: 6 (ref 5.0–8.0)

## 2018-02-05 MED ORDER — NITROFURANTOIN MONOHYD MACRO 100 MG PO CAPS: 100.0000 mg | ORAL_CAPSULE | Freq: Two times a day (BID) | ORAL | 0 refills | Status: AC

## 2018-02-05 MED ORDER — PHENAZOPYRIDINE HCL 100 MG PO TABS: 100.0000 mg | ORAL_TABLET | Freq: Three times a day (TID) | ORAL | 0 refills | Status: AC | PRN

## 2018-02-05 NOTE — Patient Instructions (Addendum)
Antibiotic - You have received the antibiotic Macrobid. It may take several days before you feel any benefit from this medicine. Be sure to take all the medication prescribed, even if you are feeling better before it is finished. Do not drink alcohol while taking antibiotics.  It is unknown whether you will develop an adverse reaction (diarrhea, rash, nausea or vomiting) or an allergic reaction (trouble breathing, anaphylaxis).        If you have lab work done today you will be contacted with your lab results within the next 2 weeks.  If you have not heard from [place] then please contact us. The fastest way to get your results is to register for My Chart.   IF you received an x-ray today, you will receive an invoice from [facility]. Please contact [facility] at [phone] with questions or concerns regarding your invoice.   IF you received labwork today, you will receive an invoice from [facility]. Please contact [facility] at [phone] with questions or concerns regarding your invoice.   Our billing staff will not be able to assist you with questions regarding bills from these companies.  You will be contacted with the lab results as soon as they are available. The fastest way to get your results is to activate your My Chart account. Instructions are located on the last page of this paperwork. If you have not heard from [place] regarding the results in 2 weeks, please contact this office.    We recommend that you schedule a mammogram for breast cancer screening. Typically, you do not need a referral to do this. Please contact a local imaging center to schedule your mammogram.  [facility] - [phone]  *ask for the Radiology [place] - [phone] or [phone]  [facility] - [phone] [place] [phone] [facility] - [phone]  *ask for the [facility] - [phone]  *ask for the Radiology Department [facility] - [phone]  *ask for the [facility] - [phone] Culture and Sensitivity Testing Why am I having this test? A urine culture is a test to see if germs grow from your urine sample. Normally, urine is free of germs (sterile). Germs in urine are usually bacteria. Sometimes they can be yeasts. These germs can cause a urinary tract infection (UTI). You may have this test if you have symptoms of a UTI. These may include:  Frequent urination.  Burning pain when passing urine.  If you are pregnant, your health care provider may order this test to screen you for a UTI. When you pass urine, the urine flows through the tube that empties your bladder (urethra). In men, urine comes out through an opening at the tip of the penis. In women, it comes out of the body from just above the vaginal opening. These areas may have bacteria near them that normally live on the skin (normal flora). What kind of sample is taken? A urine sample for a culture test must be collected in a way that keeps normal flora from getting into the sample. The method used most often is called a clean-catch sample. In a few cases, urine may need to be collected directly from the bladder using a thin, flexible tube (catheter). The health care provider puts the catheter through the person's urethra and into the bladder. Your urine sample will be placed onto plates containing a substance  that encourages bacteria to grow (agar plates). These plates are kept at body temperature for 24-48 hours to see if bacteria or other germs grow. Then, a lab technician examines them under a microscope to check for germs. Any germs that grow from the culture will be tested against a variety of medicines to find the one that works best (sensitivity testing). For a UTI caused by bacteria, several types of antibiotic medicines  may be tested. How do I prepare for this test?  Do not urinate for about an hour before collecting the sample.  Drink a glass of water about 20 minutes before collecting the sample.  Tell your health care provider if you have been taking antibiotics. This may affect the results of your test. Your health care provider may give you sterile wipes to clean your vagina or penis to prepare for collecting a clean-catch sample. To collect the sample, you will need to do the following: For Women and Girls  Sit on the toilet and spread the lips of your vagina.  Use one wipe to clean your vaginal area from front to back.  Use a second wipe to clean the opening of your urethra.  Pass a small amount of urine directly into the toilet while still spreading your vagina.  Then, hold the sterile cup underneath you and urinate into it.  Fill the cup about halfway. Cap it and return it for testing. For Men and Boys  Use the sterile wipe to clean the tip of your penis.  Pass a small amount of urine directly into the toilet first.  Then, urinate into the sterile cup.  Fill the cup about halfway. Cap it and return it for testing. What do the results mean? The result of a urine culture and sensitivity test will be positive or negative.  If enough bacteria grow from your urine sample, your test result is considered positive.  If many different bacteria grow from your urine sample, your test may be reported as contaminated.  If no bacteria grow from your sample after 24-48 hours, your test result is considered negative.  Results of sensitivity testing let your health care provider know which medicines to use to treat your infection.  If the results of your urine culture are negative, this means:  It is less likely that you have a UTI.  Your test may be repeated if you still have symptoms.  If the results of your urine culture are positive, this means:  It is more likely that you have a  UTI.  You may need to start treatment based on your sensitivity results.  Talk to your health care provider to discuss your results, treatment options, and if necessary, the need for more tests. It is your responsibility to obtain your test results. Ask the lab or department performing the test when and how you will get your results. Talk with your health care provider if you have any questions about your results. Talk with your health care provider to discuss your results, treatment options, and if necessary, the need for more tests. Talk with your health care provider if you have any questions about your results. This information is not intended to replace advice given to you by your health care provider. Make sure you discuss any questions you have with your health care provider. Document Released: 07/03/2004 Document Revised: 02/12/2016 Document Reviewed: 10/05/2013 Elsevier Interactive Patient Education  [employer].
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
## 2018-02-09 LAB — URINE CULTURE

## 2018-02-10 MED ORDER — SULFAMETHOXAZOLE-TRIMETHOPRIM 800-160 MG PO TABS: 1.0000 | ORAL_TABLET | Freq: Two times a day (BID) | ORAL | 0 refills | Status: AC

## 2018-02-10 NOTE — Addendum Note (Signed)
Addended by: Delia Chimes A on: 02/10/2018 06:25 AM   Modules accepted: Orders

## 2018-02-10 NOTE — Progress Notes (Signed)
uti with proteus vulgaris. Sent in bactrim.

## 2018-03-09 ENCOUNTER — Encounter: Payer: BLUE CROSS/BLUE SHIELD | Admitting: Family Medicine

## 2018-03-11 ENCOUNTER — Encounter: Payer: Self-pay | Admitting: Family Medicine

## 2018-03-11 ENCOUNTER — Other Ambulatory Visit: Payer: Self-pay

## 2018-03-11 ENCOUNTER — Ambulatory Visit (INDEPENDENT_AMBULATORY_CARE_PROVIDER_SITE_OTHER): Payer: BLUE CROSS/BLUE SHIELD | Admitting: Family Medicine

## 2018-03-11 VITALS — BP 121/80 | HR 68 | Temp 98.3°F | Ht 61.08 in | Wt 200.0 lb

## 2018-03-11 DIAGNOSIS — Z1231 Encounter for screening mammogram for malignant neoplasm of breast: Secondary | ICD-10-CM | POA: Diagnosis not present

## 2018-03-11 DIAGNOSIS — K760 Fatty (change of) liver, not elsewhere classified: Secondary | ICD-10-CM | POA: Diagnosis not present

## 2018-03-11 DIAGNOSIS — R300 Dysuria: Secondary | ICD-10-CM | POA: Diagnosis not present

## 2018-03-11 DIAGNOSIS — Z0001 Encounter for general adult medical examination with abnormal findings: Secondary | ICD-10-CM | POA: Diagnosis not present

## 2018-03-11 DIAGNOSIS — M797 Fibromyalgia: Secondary | ICD-10-CM

## 2018-03-11 DIAGNOSIS — I10 Essential (primary) hypertension: Secondary | ICD-10-CM

## 2018-03-11 DIAGNOSIS — F419 Anxiety disorder, unspecified: Secondary | ICD-10-CM

## 2018-03-11 DIAGNOSIS — Z0000 Encounter for general adult medical examination without abnormal findings: Secondary | ICD-10-CM

## 2018-03-11 DIAGNOSIS — R0981 Nasal congestion: Secondary | ICD-10-CM

## 2018-03-11 DIAGNOSIS — F32A Depression, unspecified: Secondary | ICD-10-CM

## 2018-03-11 DIAGNOSIS — E7800 Pure hypercholesterolemia, unspecified: Secondary | ICD-10-CM | POA: Diagnosis not present

## 2018-03-11 DIAGNOSIS — F329 Major depressive disorder, single episode, unspecified: Secondary | ICD-10-CM

## 2018-03-11 DIAGNOSIS — K219 Gastro-esophageal reflux disease without esophagitis: Secondary | ICD-10-CM

## 2018-03-11 DIAGNOSIS — N3010 Interstitial cystitis (chronic) without hematuria: Secondary | ICD-10-CM

## 2018-03-11 LAB — POC MICROSCOPIC URINALYSIS (UMFC): Mucus: ABSENT

## 2018-03-11 LAB — POCT URINALYSIS DIP (MANUAL ENTRY)
Bilirubin, UA: NEGATIVE
Blood, UA: NEGATIVE
Glucose, UA: NEGATIVE mg/dL
Ketones, POC UA: NEGATIVE mg/dL
Leukocytes, UA: NEGATIVE
Nitrite, UA: NEGATIVE
Protein Ur, POC: NEGATIVE mg/dL
Spec Grav, UA: >=1.03 — AB (ref 1.010–1.025)
Urobilinogen, UA: 0.2 E.U./dL
pH, UA: 6 (ref 5.0–8.0)

## 2018-03-11 MED ORDER — DIAZEPAM 5 MG PO TABS: 2.5000 mg | ORAL_TABLET | Freq: Every day | ORAL | 0 refills | Status: DC | PRN

## 2018-03-11 MED ORDER — BUPROPION HCL ER (XL) 150 MG PO TB24: 150.0000 mg | ORAL_TABLET | Freq: Every day | ORAL | 1 refills | Status: DC

## 2018-03-11 MED ORDER — GABAPENTIN 300 MG PO CAPS: 300.0000 mg | ORAL_CAPSULE | Freq: Every day | ORAL | 1 refills | Status: DC

## 2018-03-11 MED ORDER — MELOXICAM 7.5 MG PO TABS: 7.5000 mg | ORAL_TABLET | Freq: Every day | ORAL | 1 refills | Status: DC

## 2018-03-11 MED ORDER — SENNOSIDES-DOCUSATE SODIUM 8.6-50 MG PO TABS: 2.0000 | ORAL_TABLET | Freq: Two times a day (BID) | ORAL | 2 refills | Status: AC

## 2018-03-11 MED ORDER — HYDROXYZINE HCL 25 MG PO TABS: 25.0000 mg | ORAL_TABLET | Freq: Every evening | ORAL | 5 refills | Status: DC | PRN

## 2018-03-11 MED ORDER — LOSARTAN POTASSIUM-HCTZ 50-12.5 MG PO TABS: 1.0000 | ORAL_TABLET | Freq: Every day | ORAL | 1 refills | Status: DC

## 2018-03-11 MED ORDER — ACYCLOVIR 400 MG PO TABS: 400.0000 mg | ORAL_TABLET | Freq: Every day | ORAL | 1 refills | Status: AC

## 2018-03-11 MED ORDER — FLUTICASONE PROPIONATE 50 MCG/ACT NA SUSP: 2.0000 | NASAL | 5 refills | Status: AC | PRN

## 2018-03-11 MED ORDER — OMEPRAZOLE 20 MG PO CPDR: 20.0000 mg | DELAYED_RELEASE_CAPSULE | Freq: Every day | ORAL | 3 refills | Status: DC

## 2018-03-11 MED ORDER — ESCITALOPRAM OXALATE 10 MG PO TABS: 10.0000 mg | ORAL_TABLET | Freq: Every day | ORAL | 1 refills | Status: DC

## 2018-03-11 NOTE — Progress Notes (Signed)
9/20/20198:16 AM  Terri Hurley 1960-10-07, 57 y.o. female [id number]  Chief Complaint  Patient presents with  . Annual Exam    not sure if she is experiencing a UTI    HPI:   Patient is a 57 y.o. female with past medical history significant for fibromyalgia, DDD, HTN, fatty liver, GERD and vitamin D  who presents today for CPE  Last CPE 03/03/2017 Cervical Cancer Screening: n/a. hyst for benign reasons Breast Cancer Screening: 2017, has sister with breast cancer (50s) Colorectal Cancer Screening: 2013, normal Bone Density Testing: at age 70 HIV Screening: 2016 STI Screening: 2016 Seasonal Influenza Vaccination: declines Td/Tdap Vaccination: 2019 Pneumococcal Vaccination: at age 49 Zoster Vaccination: declines Frequency of Dental evaluation: Q6 months Frequency of Eye evaluation: wears eyeglasses, has eye doctor, will make appt  Fall Risk  03/11/2018 02/05/2018 11/22/2017 09/10/2017 06/02/2017  Falls in the past year? No No No No No  Number falls in past yr: - - - - -  Injury with Fall? - - - - -  Follow up - - - - -     Depression screen [facility] 2/9 03/11/2018 03/11/2018 02/05/2018  Decreased Interest 2 0 0  Down, Depressed, Hopeless 0 0 0  PHQ - 2 Score 2 0 0  Altered sleeping 3 - -  Tired, decreased energy 3 - -  Change in appetite 3 - -  Feeling bad or failure about yourself  0 - -  Trouble concentrating 3 - -  Moving slowly or fidgety/restless 3 - -  Suicidal thoughts 0 - -  PHQ-9 Score 17 - -  Difficult doing work/chores Very difficult - -   GAD 7 : Generalized Anxiety Score 03/11/2018  Nervous, Anxious, on Edge 1  Control/stop worrying 1  Worry too much - different things 2  Trouble relaxing 3  Restless 0  Easily annoyed or irritable 3  Afraid - awful might happen 2  Total GAD 7 Score 12  Anxiety Difficulty Somewhat difficult     Allergies  Allergen Reactions  . Robaxin [Methocarbamol] Rash  . Penicillins     Has patient had a PCN reaction causing  immediate rash, facial/tongue/throat swelling, SOB or lightheadedness with hypotension: Yes Has patient had a PCN reaction causing severe rash involving mucus membranes or skin necrosis: No Has patient had a PCN reaction that required hospitalization: No Has patient had a PCN reaction occurring within the last 10 years: No If all of the above answers are "NO", then may proceed with Cephalosporin use.     Prior to Admission medications   Medication Sig Start Date End Date Taking? Authorizing Provider  acyclovir (ZOVIRAX) 400 MG tablet Take 1 tablet (400 mg total) by mouth daily. 11/22/17  Yes Wardell Honour, MD  buPROPion (WELLBUTRIN XL) 150 MG 24 hr tablet Take 1 tablet (150 mg total) by mouth daily. 11/22/17  Yes Wardell Honour, MD  diazepam (VALIUM) 5 MG tablet Take 0.5-1 tablets (2.5-5 mg total) by mouth daily as needed for anxiety. 11/22/17  Yes Wardell Honour, MD  escitalopram (LEXAPRO) 10 MG tablet Take 1 tablet (10 mg total) by mouth daily. 11/22/17  Yes Wardell Honour, MD  fluticasone Asencion Islam) 50 MCG/ACT nasal spray Place 2 sprays into both nostrils daily. Patient taking differently: Place 2 sprays into both nostrils as needed.  11/22/17  Yes Wardell Honour, MD  gabapentin (NEURONTIN) 300 MG capsule Take 1-2 capsules (300-600 mg total) by mouth at bedtime. 11/22/17  Yes Tamala Julian,  Renette Butters, MD  hydrOXYzine (ATARAX/VISTARIL) 25 MG tablet TAKE 1 TO 2 TABLETS BY MOUTH AT BEDTIME Patient taking differently: TAKE 1 TO 2 TABLETS BY MOUTH AT BEDTIME, as needed 11/09/17  Yes Wardell Honour, MD  losartan-hydrochlorothiazide (HYZAAR) 50-12.5 MG tablet Take 1 tablet by mouth daily. 11/22/17  Yes Wardell Honour, MD  meloxicam (MOBIC) 7.5 MG tablet Take 1 tablet (7.5 mg total) by mouth daily. 11/22/17  Yes Wardell Honour, MD  nystatin cream (MYCOSTATIN) Apply 1 application topically 2 (two) times daily. Patient taking differently: Apply 1 application topically as needed.  11/22/17  Yes Wardell Honour, MD    omeprazole (PRILOSEC) 20 MG capsule Take 1 capsule (20 mg total) by mouth daily. 11/22/17  Yes Wardell Honour, MD  senna-docusate (SENOKOT-S) 8.6-50 MG per tablet Take 2 tablets by mouth 2 (two) times daily.    Yes [provider]    Past Medical History:  Diagnosis Date  . Arthritis    low back, hip, hands  . Depression    Sees [name], NP @ [facility]  counseling center.Marland Kitchen Has h/o hospitalization  . Fatty liver disease, nonalcoholic    clinical diagnosis by endocrinologist  . GERD (gastroesophageal reflux disease)    controlled with PPI therapy  . Hepatitis unknown type   . High cholesterol    No medical therapy. Last lab March '12  LDL 91, T. Chol 170. Minimal elevation in '08  . HNP (herniated nucleus pulposus), cervical    C6-7. Has had PT, no surgery  . Plantar fasciitis   . Recurrent genital HSV (herpes simplex virus) infection    on daily suppression with acyclovir  . Urinary incontinence   . Vitamin D deficiency    lab '09  Vit D = 19    Past Surgical History:  Procedure Laterality Date  . CHOLECYSTECTOMY    . INCONTINENCE SURGERY  2009  . SPINE SURGERY  05/2014   Cervical spine surgery C4-5, C5-6.  [place].  Marland Kitchen TOTAL ABDOMINAL HYSTERECTOMY W/ BILATERAL SALPINGOOPHORECTOMY  2009   with repair of cystocele and rectocele     Social History   Tobacco Use  . Smoking status: Never Smoker  . Smokeless tobacco: Never Used  Substance Use Topics  . Alcohol use: No    Family History  Problem Relation Age of Onset  . Heart disease Mother 39       AMI  . Diabetes Mother        peripheral neuropathy  . Hypertension Mother   . Hyperlipidemia Mother   . Mental illness Mother        depression and anxiety  . Gout Mother   . Diabetes Father   . Heart disease Father   . Hyperlipidemia Father   . Mental illness Father        depression and anxiety  . Heart disease Brother        arrythmia, DCC  . Hyperlipidemia Brother   . Mental illness Brother    . Cancer Sister 73       breast cancer  . Mental illness Sister   . Asthma Sister   . Thyroid disease Sister   . COPD Sister   . Mental illness Sister   . COPD Brother   . Stroke Brother   . Hyperlipidemia Brother   . Hypertension Brother   . Mental illness Brother   . Mental illness Sister   . Mental illness Brother   . Mental illness Brother   .  GER disease Son     Review of Systems  Constitutional: Positive for malaise/fatigue. Negative for chills and fever.  HENT: Positive for ear pain (right ear, intermittent, chronic). Negative for congestion, sinus pain and sore throat.   Eyes: Positive for blurred vision. Negative for pain, discharge and redness.  Respiratory: Negative for cough, shortness of breath and wheezing.   Cardiovascular: Negative for chest pain, palpitations and leg swelling.  Gastrointestinal: Positive for constipation (well managed with meds), heartburn (well managed with meds) and nausea. Negative for abdominal pain, blood in stool, diarrhea, melena and vomiting.  Genitourinary: Positive for dysuria and frequency (nocturia). Negative for hematuria and urgency.  Musculoskeletal: Positive for joint pain, myalgias and neck pain.  Neurological: Negative for dizziness, focal weakness and headaches.  Endo/Heme/Allergies: Positive for environmental allergies (well controlled on meds).  Psychiatric/Behavioral: Positive for depression (well controlled on meds). Negative for suicidal ideas. The patient is nervous/anxious (well controlled on meds).      OBJECTIVE:  Blood pressure 121/80, pulse 68, temperature 98.3 F (36.8 C), temperature source Oral, height 5' 1.08" (1.551 m), weight 200 lb (90.7 kg), SpO2 95 %. Body mass index is 37.69 kg/m.   Wt Readings from Last 3 Encounters:  03/11/18 200 lb (90.7 kg)  02/05/18 201 lb 9.6 oz (91.4 kg)  01/07/18 204 lb (92.5 kg)   BP Readings from Last 3 Encounters:  03/11/18 121/80  02/05/18 132/83  01/07/18 113/72     Visual Acuity Screening   Right eye Left eye Both eyes  Without correction:     With correction: 20/70 20/70 20/200    Physical Exam  Constitutional: She is oriented to person, place, and time. She appears well-developed and well-nourished.  HENT:  Head: Normocephalic and atraumatic.  Right Ear: Hearing, tympanic membrane, external ear and ear canal normal.  Left Ear: Hearing, tympanic membrane, external ear and ear canal normal.  Mouth/Throat: Oropharynx is clear and moist.  Eyes: Pupils are equal, round, and reactive to light. Conjunctivae and EOM are normal.  Neck: Neck supple. No thyromegaly present.  Cardiovascular: Normal rate, regular rhythm, normal heart sounds and intact distal pulses. Exam reveals no gallop and no friction rub.  No murmur heard. Pulmonary/Chest: Effort normal and breath sounds normal. She has no wheezes. She has no rales. Right breast exhibits no inverted nipple, no mass, no nipple discharge and no skin change. Left breast exhibits no inverted nipple, no mass, no nipple discharge and no skin change.  Abdominal: Soft. Bowel sounds are normal. She exhibits no distension and no mass. There is no tenderness.  Musculoskeletal: Normal range of motion. She exhibits no edema.  Lymphadenopathy:    She has no cervical adenopathy.    She has no axillary adenopathy.       Right: No supraclavicular adenopathy present.       Left: No supraclavicular adenopathy present.  Neurological: She is alert and oriented to person, place, and time. She has normal reflexes. No cranial nerve deficit. Gait normal.  Skin: Skin is warm and dry.  Psychiatric: She has a normal mood and affect.  Nursing note and vitals reviewed.   Results for orders placed or performed in visit on 03/11/18 (from the past 24 hour(s))  POCT urinalysis dipstick     Status: Abnormal   Collection Time: 03/11/18  8:23 AM  Result Value Ref Range   Color, UA yellow yellow   Clarity, UA cloudy (A) clear    Glucose, UA negative negative mg/dL   Bilirubin, UA  negative negative   Ketones, POC UA negative negative mg/dL   Spec Grav, UA >=1.030 (A) 1.010 - 1.025   Blood, UA negative negative   pH, UA 6.0 5.0 - 8.0   Protein Ur, POC negative negative mg/dL   Urobilinogen, UA 0.2 0.2 or 1.0 E.U./dL   Nitrite, UA Negative Negative   Leukocytes, UA Negative Negative  POCT Microscopic Urinalysis ([facility])     Status: Abnormal   Collection Time: 03/11/18  8:27 AM  Result Value Ref Range   WBC,UR,HPF,POC None None WBC/hpf   RBC,UR,HPF,POC None None RBC/hpf   Bacteria None None, Too numerous to count   Mucus Absent Absent   Epithelial Cells, UR Per Microscopy Moderate (A) None, Too numerous to count cells/hpf      ASSESSMENT and PLAN  1. Annual physical exam  Routine HCM labs ordered. HCM reviewed/discussed. Anticipatory guidance regarding healthy weight, lifestyle and choices given.   2. Visit for screening mammogram - MM DIGITAL SCREENING BILATERAL; Future  3. Dysuria  Neg ua. Discussed pushing fluids and avoidance of bladder irritants - POCT urinalysis dipstick - POCT Microscopic Urinalysis ([facility])  4. Anxiety and depression - TSH - diazepam (VALIUM) 5 MG tablet; Take 0.5-1 tablets (2.5-5 mg total) by mouth daily as needed for anxiety.  5. Pure hypercholesterolemia - Lipid panel  6. Essential hypertension - CBC with Differential/Platelet - Comprehensive metabolic panel  7. Gastroesophageal reflux disease without esophagitis  8. Fatty liver disease, nonalcoholic - CBC with Differential/Platelet - Comprehensive metabolic panel  9. Fibromyalgia  10. Interstitial cystitis - hydrOXYzine (ATARAX/VISTARIL) 25 MG tablet; Take 1-2 tablets (25-50 mg total) by mouth at bedtime as needed.  11. Nasal congestion - fluticasone (FLONASE) 50 MCG/ACT nasal spray; Place 2 sprays into both nostrils as needed.  Other orders - acyclovir (ZOVIRAX) 400 MG tablet; Take 1 tablet (400 mg total) by  mouth daily. - buPROPion (WELLBUTRIN XL) 150 MG 24 hr tablet; Take 1 tablet (150 mg total) by mouth daily. - escitalopram (LEXAPRO) 10 MG tablet; Take 1 tablet (10 mg total) by mouth daily. - gabapentin (NEURONTIN) 300 MG capsule; Take 1-2 capsules (300-600 mg total) by mouth at bedtime. - losartan-hydrochlorothiazide (HYZAAR) 50-12.5 MG tablet; Take 1 tablet by mouth daily. - meloxicam (MOBIC) 7.5 MG tablet; Take 1 tablet (7.5 mg total) by mouth daily. - omeprazole (PRILOSEC) 20 MG capsule; Take 1 capsule (20 mg total) by mouth daily. - senna-docusate (SENOKOT-S) 8.6-50 MG tablet; Take 2 tablets by mouth 2 (two) times daily.   Chronic medical conditions are stable. Cont current regime. meds refilled.  Return for to discuss arm pain.    Rutherford Guys, MD [facility] [address], [place] [address] Ph.  [phone] Fax [phone]

## 2018-03-11 NOTE — Patient Instructions (Addendum)
Please schedule appointment with your eye doctor   If you have lab work done today you will be contacted with your lab results within the next 2 weeks.  If you have not heard from us then please contact us. The fastest way to get your results is to register for My Chart.   IF you received an x-ray today, you will receive an invoice from [facility]. Please contact [facility] at [phone] with questions or concerns regarding your invoice.   IF you received labwork today, you will receive an invoice from [facility]. Please contact [facility] at [phone] with questions or concerns regarding your invoice.   Our billing staff will not be able to assist you with questions regarding bills from these companies.  You will be contacted with the lab results as soon as they are available. The fastest way to get your results is to activate your My Chart account. Instructions are located on the last page of this paperwork. If you have not heard from us regarding the results in 2 weeks, please contact this office.     Preventive Care 40-64 Years, Female Preventive care refers to lifestyle choices and visits with your health care provider that can promote health and wellness. What does preventive care include?  A yearly physical exam. This is also called an annual well check.  Dental exams once or twice a year.  Routine eye exams. Ask your health care provider how often you should have your eyes checked.  Personal lifestyle choices, including: ? Daily care of your teeth and gums. ? Regular physical activity. ? Eating a healthy diet. ? Avoiding tobacco and drug use. ? Limiting alcohol use. ? Practicing safe sex. ? Taking low-dose aspirin daily starting at age 50. ? Taking vitamin and mineral supplements as recommended by your health care provider. What happens during an annual well check? The services and screenings done by your health care provider during your annual well  check will depend on your age, overall health, lifestyle risk factors, and family history of disease. Counseling Your health care provider may ask you questions about your:  Alcohol use.  Tobacco use.  Drug use.  Emotional well-being.  Home and relationship well-being.  Sexual activity.  Eating habits.  Work and work environment.  Method of birth control.  Menstrual cycle.  Pregnancy history.  Screening You may have the following tests or measurements:  Height, weight, and BMI.  Blood pressure.  Lipid and cholesterol levels. These may be checked every 5 years, or more frequently if you are over 50 years old.  Skin check.  Lung cancer screening. You may have this screening every year starting at age 55 if you have a 30-pack-year history of smoking and currently smoke or have quit within the past 15 years.  Fecal occult blood test (FOBT) of the stool. You may have this test every year starting at age 50.  Flexible sigmoidoscopy or colonoscopy. You may have a sigmoidoscopy every 5 years or a colonoscopy every 10 years starting at age 50.  Hepatitis C blood test.  Hepatitis B blood test.  Sexually transmitted disease (STD) testing.  Diabetes screening. This is done by checking your blood sugar (glucose) after you have not eaten for a while (fasting). You may have this done every 1-3 years.  Mammogram. This may be done every 1-2 years. Talk to your health care provider about when you should start having regular mammograms. This may depend on whether you have a family history of   breast cancer.  BRCA-related cancer screening. This may be done if you have a family history of breast, ovarian, tubal, or peritoneal cancers.  Pelvic exam and Pap test. This may be done every 3 years starting at age 21. Starting at age 30, this may be done every 5 years if you have a Pap test in combination with an HPV test.  Bone density scan. This is done to screen for osteoporosis. You  may have this scan if you are at high risk for osteoporosis.  Discuss your test results, treatment options, and if necessary, the need for more tests with your health care provider. Vaccines Your health care provider may recommend certain vaccines, such as:  Influenza vaccine. This is recommended every year.  Tetanus, diphtheria, and acellular pertussis (Tdap, Td) vaccine. You may need a Td booster every 10 years.  Varicella vaccine. You may need this if you have not been vaccinated.  Zoster vaccine. You may need this after age 60.  Measles, mumps, and rubella (MMR) vaccine. You may need at least one dose of MMR if you were born in 1957 or later. You may also need a second dose.  Pneumococcal 13-valent conjugate (PCV13) vaccine. You may need this if you have certain conditions and were not previously vaccinated.  Pneumococcal polysaccharide (PPSV23) vaccine. You may need one or two doses if you smoke cigarettes or if you have certain conditions.  Meningococcal vaccine. You may need this if you have certain conditions.  Hepatitis A vaccine. You may need this if you have certain conditions or if you travel or work in places where you may be exposed to hepatitis A.  Hepatitis B vaccine. You may need this if you have certain conditions or if you travel or work in places where you may be exposed to hepatitis B.  Haemophilus influenzae type b (Hib) vaccine. You may need this if you have certain conditions.  Talk to your health care provider about which screenings and vaccines you need and how often you need them. This information is not intended to replace advice given to you by your health care provider. Make sure you discuss any questions you have with your health care provider. Document Released: 07/05/2015 Document Revised: 02/26/2016 Document Reviewed: 04/09/2015 Elsevier Interactive Patient Education  2018 Elsevier Inc.  

## 2018-03-12 LAB — CBC WITH DIFFERENTIAL/PLATELET
Basophils Absolute: 0 10*3/uL (ref 0.0–0.2)
Basos: 1 %
EOS (ABSOLUTE): 0 10*3/uL (ref 0.0–0.4)
Eos: 1 %
Hematocrit: 37.9 % (ref 34.0–46.6)
Hemoglobin: 11.8 g/dL (ref 11.1–15.9)
Immature Grans (Abs): 0 10*3/uL (ref 0.0–0.1)
Immature Granulocytes: 0 %
Lymphocytes Absolute: 1 10*3/uL (ref 0.7–3.1)
Lymphs: 20 %
MCH: 27.4 pg (ref 26.6–33.0)
MCHC: 31.1 g/dL — ABNORMAL LOW (ref 31.5–35.7)
MCV: 88 fL (ref 79–97)
Monocytes Absolute: 0.6 10*3/uL (ref 0.1–0.9)
Monocytes: 11 %
Neutrophils Absolute: 3.6 10*3/uL (ref 1.4–7.0)
Neutrophils: 67 %
Platelets: 304 10*3/uL (ref 150–450)
RBC: 4.31 x10E6/uL (ref 3.77–5.28)
RDW: 15.6 % — ABNORMAL HIGH (ref 12.3–15.4)
WBC: 5.2 10*3/uL (ref 3.4–10.8)

## 2018-03-12 LAB — LIPID PANEL
Chol/HDL Ratio: 3.4 ratio (ref 0.0–4.4)
Cholesterol, Total: 202 mg/dL — ABNORMAL HIGH (ref 100–199)
HDL: 59 mg/dL (ref >39)
LDL Calculated: 123 mg/dL — ABNORMAL HIGH (ref 0–99)
Triglycerides: 102 mg/dL (ref 0–149)
VLDL Cholesterol Cal: 20 mg/dL (ref 5–40)

## 2018-03-12 LAB — COMPREHENSIVE METABOLIC PANEL
ALT: 51 IU/L — ABNORMAL HIGH (ref 0–32)
AST: 40 IU/L (ref 0–40)
Albumin/Globulin Ratio: 1.5 (ref 1.2–2.2)
Albumin: 4 g/dL (ref 3.5–5.5)
Alkaline Phosphatase: 85 IU/L (ref 39–117)
BUN/Creatinine Ratio: 9 (ref 9–23)
BUN: 9 mg/dL (ref 6–24)
Bilirubin Total: 0.5 mg/dL (ref 0.0–1.2)
CO2: 22 mmol/L (ref 20–29)
Calcium: 9.1 mg/dL (ref 8.7–10.2)
Chloride: 97 mmol/L (ref 96–106)
Creatinine, Ser: 0.98 mg/dL (ref 0.57–1.00)
GFR calc Af Amer: 74 mL/min/{1.73_m2} (ref >59)
GFR calc non Af Amer: 64 mL/min/{1.73_m2} (ref >59)
Globulin, Total: 2.7 g/dL (ref 1.5–4.5)
Glucose: 95 mg/dL (ref 65–99)
Potassium: 3.8 mmol/L (ref 3.5–5.2)
Sodium: 137 mmol/L (ref 134–144)
Total Protein: 6.7 g/dL (ref 6.0–8.5)

## 2018-03-12 LAB — TSH: TSH: 1.78 u[IU]/mL (ref 0.450–4.500)

## 2018-03-15 ENCOUNTER — Other Ambulatory Visit: Payer: Self-pay | Admitting: Family Medicine

## 2018-03-15 DIAGNOSIS — F419 Anxiety disorder, unspecified: Secondary | ICD-10-CM

## 2018-03-15 DIAGNOSIS — F32A Depression, unspecified: Secondary | ICD-10-CM

## 2018-03-15 DIAGNOSIS — F329 Major depressive disorder, single episode, unspecified: Secondary | ICD-10-CM

## 2018-03-24 ENCOUNTER — Encounter: Payer: Self-pay | Admitting: Family Medicine

## 2018-03-24 DIAGNOSIS — F419 Anxiety disorder, unspecified: Secondary | ICD-10-CM

## 2018-03-24 DIAGNOSIS — F329 Major depressive disorder, single episode, unspecified: Secondary | ICD-10-CM

## 2018-03-24 DIAGNOSIS — F32A Depression, unspecified: Secondary | ICD-10-CM

## 2018-03-26 MED ORDER — DIAZEPAM 5 MG PO TABS: 2.5000 mg | ORAL_TABLET | Freq: Every day | ORAL | 0 refills | Status: AC | PRN

## 2018-03-26 NOTE — Telephone Encounter (Signed)
pmp reviewed  Med refilled 
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
## 2018-04-08 ENCOUNTER — Encounter: Payer: Self-pay | Admitting: Physician Assistant

## 2018-04-08 ENCOUNTER — Ambulatory Visit: Payer: BLUE CROSS/BLUE SHIELD | Admitting: Physician Assistant

## 2018-04-08 VITALS — BP 117/65 | HR 67 | Resp 12 | Ht 62.0 in | Wt 201.0 lb

## 2018-04-08 DIAGNOSIS — E66812 Obesity, class 2: Secondary | ICD-10-CM

## 2018-04-08 DIAGNOSIS — R748 Abnormal levels of other serum enzymes: Secondary | ICD-10-CM

## 2018-04-08 DIAGNOSIS — M19042 Primary osteoarthritis, left hand: Secondary | ICD-10-CM

## 2018-04-08 DIAGNOSIS — M791 Myalgia, unspecified site: Secondary | ICD-10-CM

## 2018-04-08 DIAGNOSIS — E559 Vitamin D deficiency, unspecified: Secondary | ICD-10-CM

## 2018-04-08 DIAGNOSIS — R768 Other specified abnormal immunological findings in serum: Secondary | ICD-10-CM | POA: Diagnosis not present

## 2018-04-08 DIAGNOSIS — K760 Fatty (change of) liver, not elsewhere classified: Secondary | ICD-10-CM

## 2018-04-08 DIAGNOSIS — I10 Essential (primary) hypertension: Secondary | ICD-10-CM

## 2018-04-08 DIAGNOSIS — M797 Fibromyalgia: Secondary | ICD-10-CM | POA: Diagnosis not present

## 2018-04-08 DIAGNOSIS — R5383 Other fatigue: Secondary | ICD-10-CM

## 2018-04-08 DIAGNOSIS — K219 Gastro-esophageal reflux disease without esophagitis: Secondary | ICD-10-CM

## 2018-04-08 DIAGNOSIS — M51369 Other intervertebral disc degeneration, lumbar region without mention of lumbar back pain or lower extremity pain: Secondary | ICD-10-CM

## 2018-04-08 DIAGNOSIS — M5136 Other intervertebral disc degeneration, lumbar region: Secondary | ICD-10-CM

## 2018-04-08 DIAGNOSIS — F5101 Primary insomnia: Secondary | ICD-10-CM

## 2018-04-08 DIAGNOSIS — M19041 Primary osteoarthritis, right hand: Secondary | ICD-10-CM

## 2018-04-08 DIAGNOSIS — M5030 Other cervical disc degeneration, unspecified cervical region: Secondary | ICD-10-CM

## 2018-04-08 DIAGNOSIS — E669 Obesity, unspecified: Secondary | ICD-10-CM

## 2018-04-11 LAB — SEDIMENTATION RATE: Sed Rate: 17 mm/h (ref 0–30)

## 2018-04-11 LAB — CK: Total CK: 234 U/L — ABNORMAL HIGH (ref 29–143)

## 2018-04-11 LAB — ALDOLASE: Aldolase: 6.3 U/L (ref <=8.1)

## 2018-04-12 NOTE — Progress Notes (Signed)
CK is chronically elevated. CK is 234. Sed rate and aldolase WNL.  Dr. Estanislado Pandy would like to refer the patient to neurology for further evaluation. We will discuss [facility] labs once they are complete with the patient at her return visit on 05/06/18.

## 2018-04-14 ENCOUNTER — Telehealth: Payer: Self-pay | Admitting: Rheumatology

## 2018-04-14 DIAGNOSIS — R748 Abnormal levels of other serum enzymes: Secondary | ICD-10-CM

## 2018-04-14 NOTE — Telephone Encounter (Signed)
Patient called stating she was returning your call.   °

## 2018-04-14 NOTE — Telephone Encounter (Signed)
Advised patient of lab results and recommendations, patient verbalized understanding. Patient would like to proceed with neurology referral. Referral has been placed.

## 2018-04-22 DIAGNOSIS — R5383 Other fatigue: Secondary | ICD-10-CM | POA: Diagnosis not present

## 2018-04-22 DIAGNOSIS — R768 Other specified abnormal immunological findings in serum: Secondary | ICD-10-CM | POA: Diagnosis not present

## 2018-04-22 DIAGNOSIS — M7918 Myalgia, other site: Secondary | ICD-10-CM | POA: Diagnosis not present
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
## 2018-04-28 ENCOUNTER — Telehealth: Payer: Self-pay | Admitting: *Deleted

## 2018-04-28 NOTE — Telephone Encounter (Signed)
Attempted to contact the patient and left message for patient to call the office. Per Dr. Estanislado Pandy schedule a sooner appointment to discuss AVISE labs.

## 2018-05-06 ENCOUNTER — Ambulatory Visit: Payer: BLUE CROSS/BLUE SHIELD | Admitting: Rheumatology

## 2018-05-06 ENCOUNTER — Encounter: Payer: Self-pay | Admitting: Rheumatology

## 2018-05-06 VITALS — BP 117/80 | HR 70 | Resp 15 | Ht 62.0 in | Wt 200.2 lb

## 2018-05-06 DIAGNOSIS — M3219 Other organ or system involvement in systemic lupus erythematosus: Secondary | ICD-10-CM

## 2018-05-06 DIAGNOSIS — R5383 Other fatigue: Secondary | ICD-10-CM

## 2018-05-06 DIAGNOSIS — M5136 Other intervertebral disc degeneration, lumbar region: Secondary | ICD-10-CM

## 2018-05-06 DIAGNOSIS — E66812 Obesity, class 2: Secondary | ICD-10-CM

## 2018-05-06 DIAGNOSIS — M19042 Primary osteoarthritis, left hand: Secondary | ICD-10-CM

## 2018-05-06 DIAGNOSIS — I10 Essential (primary) hypertension: Secondary | ICD-10-CM

## 2018-05-06 DIAGNOSIS — M19041 Primary osteoarthritis, right hand: Secondary | ICD-10-CM

## 2018-05-06 DIAGNOSIS — M791 Myalgia, unspecified site: Secondary | ICD-10-CM | POA: Diagnosis not present

## 2018-05-06 DIAGNOSIS — R748 Abnormal levels of other serum enzymes: Secondary | ICD-10-CM

## 2018-05-06 DIAGNOSIS — Z8269 Family history of other diseases of the musculoskeletal system and connective tissue: Secondary | ICD-10-CM | POA: Diagnosis not present

## 2018-05-06 DIAGNOSIS — M51369 Other intervertebral disc degeneration, lumbar region without mention of lumbar back pain or lower extremity pain: Secondary | ICD-10-CM

## 2018-05-06 DIAGNOSIS — M5030 Other cervical disc degeneration, unspecified cervical region: Secondary | ICD-10-CM

## 2018-05-06 DIAGNOSIS — E669 Obesity, unspecified: Secondary | ICD-10-CM

## 2018-05-06 DIAGNOSIS — K760 Fatty (change of) liver, not elsewhere classified: Secondary | ICD-10-CM

## 2018-05-06 DIAGNOSIS — E559 Vitamin D deficiency, unspecified: Secondary | ICD-10-CM

## 2018-05-06 DIAGNOSIS — M797 Fibromyalgia: Secondary | ICD-10-CM | POA: Diagnosis not present

## 2018-05-06 DIAGNOSIS — K219 Gastro-esophageal reflux disease without esophagitis: Secondary | ICD-10-CM

## 2018-05-06 DIAGNOSIS — F5101 Primary insomnia: Secondary | ICD-10-CM

## 2018-05-06 DIAGNOSIS — Z79899 Other long term (current) drug therapy: Secondary | ICD-10-CM

## 2018-05-06 MED ORDER — HYDROXYCHLOROQUINE SULFATE 200 MG PO TABS: 200.0000 mg | ORAL_TABLET | Freq: Two times a day (BID) | ORAL | 0 refills | Status: DC

## 2018-05-06 NOTE — Progress Notes (Signed)
Pharmacy Note  Patient was counseled on the purpose, proper use, and adverse effects of hydroxychloroquine including nausea/diarrhea, skin rash, headaches, and sun sensitivity.  Discussed increase risk of infection and recommended the flu, pneumonia, and Shingrix vaccines.  Patient apprehensive about the flu vaccine and people get the flu from the vaccine.  Had detailed discussion about the flu shot and the immune response.  Patient had pneumonia vaccine in the past and will follow up with her PCP.   Discussed importance of annual eye exams while on hydroxychloroquine to monitor to ocular toxicity and discussed importance of frequent laboratory monitoring.  Instructed patient to return for labs in 1 month and then in 3 months and then every 5 months.  Provided patient with eye exam form for baseline ophthalmologic exam.  Provided patient with educational materials on hydroxychloroquine and answered all questions.  Patient consented to hydroxychloroquine.  Will upload consent in the media tab.    Patient dose will be 200 mg twice daily.  Instructed patient to take with food to minimize GI upset.  Also counseled on the the use of baby aspirn.  Instructed patient to take enteric coated 81 mg aspirin with food to minimize stomach upset since she has acid reflux.    Patient verbalized understanding.  Instructed patient to call with any questions or concerns.    Mariella Saa, PharmD, [facility] Rheumatology Clinical Pharmacist  05/06/2018 10:05 AM

## 2018-05-06 NOTE — Patient Instructions (Addendum)
Hydroxychloroquine tablets What is this medicine? HYDROXYCHLOROQUINE (hye drox ee KLOR oh kwin) is used to treat rheumatoid arthritis and systemic lupus erythematosus. It is also used to treat malaria. This medicine may be used for other purposes; ask your health care provider or pharmacist if you have questions. COMMON BRAND NAME(S): Plaquenil, Quineprox What should I tell my health care provider before I take this medicine? They need to know if you have any of these conditions: -diabetes -eye disease, vision problems -G6PD deficiency -history of blood diseases -history of irregular heartbeat -if you often drink alcohol -kidney disease -liver disease -porphyria -psoriasis -seizures -an unusual or allergic reaction to chloroquine, hydroxychloroquine, other medicines, foods, dyes, or preservatives -pregnant or trying to get pregnant -breast-feeding How should I use this medicine? Take this medicine by mouth with a glass of water. Follow the directions on the prescription label. Avoid taking antacids within 4 hours of taking this medicine. It is best to separate these medicines by at least 4 hours. Do not cut, crush or chew this medicine. You can take it with or without food. If it upsets your stomach, take it with food. Take your medicine at regular intervals. Do not take your medicine more often than directed. Take all of your medicine as directed even if you think you are better. Do not skip doses or stop your medicine early. Talk to your pediatrician regarding the use of this medicine in children. While this drug may be prescribed for selected conditions, precautions do apply. Overdosage: If you think you have taken too much of this medicine contact a poison control center or emergency room at once. NOTE: This medicine is only for you. Do not share this medicine with others. What if I miss a dose? If you miss a dose, take it as soon as you can. If it is almost time for your next dose,  take only that dose. Do not take double or extra doses. What may interact with this medicine? Do not take this medicine with any of the following medications: -cisapride -dofetilide -dronedarone -live virus vaccines -penicillamine -pimozide -thioridazine -ziprasidone This medicine may also interact with the following medications: -ampicillin -antacids -cimetidine -cyclosporine -digoxin -medicines for diabetes, like insulin, glipizide, glyburide -medicines for seizures like carbamazepine, phenobarbital, phenytoin -mefloquine -methotrexate -other medicines that prolong the QT interval (cause an abnormal heart rhythm) -praziquantel This list may not describe all possible interactions. Give your health care provider a list of all the medicines, herbs, non-prescription drugs, or dietary supplements you use. Also tell them if you smoke, drink alcohol, or use illegal drugs. Some items may interact with your medicine. What should I watch for while using this medicine? Tell your doctor or healthcare professional if your symptoms do not start to get better or if they get worse. Avoid taking antacids within 4 hours of taking this medicine. It is best to separate these medicines by at least 4 hours. Tell your doctor or health care professional right away if you have any change in your eyesight. Your vision and blood may be tested before and during use of this medicine. This medicine can make you more sensitive to the sun. Keep out of the sun. If you cannot avoid being in the sun, wear protective clothing and use sunscreen. Do not use sun lamps or tanning beds/booths. What side effects may I notice from receiving this medicine? Side effects that you should report to your doctor or health care professional as soon as possible: -allergic reactions like skin rash,   itching or hives, swelling of the face, lips, or tongue -changes in vision -decreased hearing or ringing of the ears -redness,  blistering, peeling or loosening of the skin, including inside the mouth -seizures -sensitivity to light -signs and symptoms of a dangerous change in heartbeat or heart rhythm like chest pain; dizziness; fast or irregular heartbeat; palpitations; feeling faint or lightheaded, falls; breathing problems -signs and symptoms of liver injury like dark yellow or brown urine; general ill feeling or flu-like symptoms; light-colored stools; loss of appetite; nausea; right upper belly pain; unusually weak or tired; yellowing of the eyes or skin -signs and symptoms of low blood sugar such as feeling anxious; confusion; dizziness; increased hunger; unusually weak or tired; sweating; shakiness; cold; irritable; headache; blurred vision; fast heartbeat; loss of consciousness -uncontrollable head, mouth, neck, arm, or leg movements Side effects that usually do not require medical attention (report to your doctor or health care professional if they continue or are bothersome): -anxious -diarrhea -dizziness -hair loss -headache -irritable -loss of appetite -nausea, vomiting -stomach pain This list may not describe all possible side effects. Call your doctor for medical advice about side effects. You may report side effects to FDA at 1-800-FDA-1088. Where should I keep my medicine? Keep out of the reach of children. In children, this medicine can cause overdose with small doses. Store at room temperature between 15 and 30 degrees C (59 and 86 degrees F). Protect from moisture and light. Throw away any unused medicine after the expiration date. NOTE: This sheet is a summary. It may not cover all possible information. If you have questions about this medicine, talk to your doctor, pharmacist, or health care provider.  2018 Elsevier/Gold Standard (2016-01-22 14:16:15)  Standing Labs We placed an order today for your standing lab work.    Please come back and get your standing labs in 1 month and then every 3  months  We have open lab Monday through Friday from 8:30-11:30 AM and 1:30-4:00 PM  at the office of Dr. Bo Merino.   You may experience shorter wait times on Monday and Friday afternoons. The office is located at [address], [place], [place], [place] [address] No appointment is necessary.   Labs are drawn by [facility].  You may receive a bill from [facility] for your lab work. If you have any questions regarding directions or hours of operation,  please call [phone].   Just as a reminder please drink plenty of water prior to coming for your lab work. Thanks!  Get baseline examination and then eye exam on yearly basis.  Vaccines You are taking a medication(s) that can suppress your immune system.  The following immunizations are recommended: . Flu annually . Pneumonia (Pneumovax 92 and Prevnar 13 after age 79) . Shingrix  Please check with your PCP to make sure you are up to date.

## 2018-05-09 ENCOUNTER — Telehealth: Payer: Self-pay | Admitting: Pharmacist

## 2018-05-09 DIAGNOSIS — M3219 Other organ or system involvement in systemic lupus erythematosus: Secondary | ICD-10-CM

## 2018-05-09 MED ORDER — HYDROXYCHLOROQUINE SULFATE 200 MG PO TABS: 200.0000 mg | ORAL_TABLET | Freq: Two times a day (BID) | ORAL | 0 refills | Status: DC

## 2018-05-09 NOTE — Telephone Encounter (Signed)
Patient called requesting a printed prescription of Plaquenil as it would be $100 at her current pharmacy.  Would like to find a pharmacy with cheaper price.  Informed patient of good rx and included card with prescription.

## 2018-05-20 ENCOUNTER — Ambulatory Visit (INDEPENDENT_AMBULATORY_CARE_PROVIDER_SITE_OTHER): Payer: BLUE CROSS/BLUE SHIELD | Admitting: Emergency Medicine

## 2018-05-20 ENCOUNTER — Telehealth: Payer: Self-pay | Admitting: *Deleted

## 2018-05-20 DIAGNOSIS — Z23 Encounter for immunization: Secondary | ICD-10-CM | POA: Diagnosis not present

## 2018-05-20 NOTE — Progress Notes (Signed)
Nurse visit

## 2018-05-23 ENCOUNTER — Encounter: Payer: Self-pay | Admitting: *Deleted

## 2018-05-23 ENCOUNTER — Encounter: Payer: Self-pay | Admitting: Family Medicine

## 2018-05-23 DIAGNOSIS — M329 Systemic lupus erythematosus, unspecified: Secondary | ICD-10-CM | POA: Insufficient documentation

## 2018-05-23 NOTE — Telephone Encounter (Signed)
Patient can come in for pneumovax 23 at her convenience given she is immunosuppressed due to lupus She can get shingrix (2 dose series) at her pharmacy of choice thanks

## 2018-05-23 NOTE — Telephone Encounter (Signed)
Sent mychart message

## 2018-05-27 ENCOUNTER — Ambulatory Visit (INDEPENDENT_AMBULATORY_CARE_PROVIDER_SITE_OTHER): Payer: BLUE CROSS/BLUE SHIELD | Admitting: Family Medicine

## 2018-05-27 DIAGNOSIS — Z23 Encounter for immunization: Secondary | ICD-10-CM

## 2018-06-24 ENCOUNTER — Other Ambulatory Visit: Payer: Self-pay | Admitting: *Deleted

## 2018-06-24 DIAGNOSIS — M3219 Other organ or system involvement in systemic lupus erythematosus: Secondary | ICD-10-CM | POA: Diagnosis not present

## 2018-06-24 DIAGNOSIS — Z79899 Other long term (current) drug therapy: Secondary | ICD-10-CM

## 2018-06-27 LAB — COMPLETE METABOLIC PANEL WITH GFR
AG Ratio: 1.5 (calc) (ref 1.0–2.5)
ALT: 27 U/L (ref 6–29)
AST: 25 U/L (ref 10–35)
Albumin: 4.1 g/dL (ref 3.6–5.1)
Alkaline phosphatase (APISO): 73 U/L (ref 33–130)
BUN/Creatinine Ratio: 8 (calc) (ref 6–22)
BUN: 9 mg/dL (ref 7–25)
CO2: 28 mmol/L (ref 20–32)
Calcium: 8.9 mg/dL (ref 8.6–10.4)
Chloride: 96 mmol/L — ABNORMAL LOW (ref 98–110)
Creat: 1.06 mg/dL — ABNORMAL HIGH (ref 0.50–1.05)
GFR, Est African American: 67 mL/min/{1.73_m2} (ref >=60)
GFR, Est Non African American: 58 mL/min/{1.73_m2} — ABNORMAL LOW (ref >=60)
Globulin: 2.7 g/dL (calc) (ref 1.9–3.7)
Glucose, Bld: 95 mg/dL (ref 65–99)
Potassium: 3.3 mmol/L — ABNORMAL LOW (ref 3.5–5.3)
Sodium: 133 mmol/L — ABNORMAL LOW (ref 135–146)
Total Bilirubin: 0.6 mg/dL (ref 0.2–1.2)
Total Protein: 6.8 g/dL (ref 6.1–8.1)

## 2018-06-27 LAB — LUPUS ANTICOAGULANT EVAL W/ REFLEX
PT, Mixing Interp: POSITIVE — AB
PTT-LA Screen: 95 s — ABNORMAL HIGH (ref <=40)
dRVVT: 67 s — ABNORMAL HIGH (ref <=45)

## 2018-06-27 LAB — CBC WITH DIFFERENTIAL/PLATELET
Absolute Monocytes: 550 cells/uL (ref 200–950)
Basophils Absolute: 38 cells/uL (ref 0–200)
Basophils Relative: 0.6 %
Eosinophils Absolute: 640 cells/uL — ABNORMAL HIGH (ref 15–500)
Eosinophils Relative: 10 %
HCT: 39 % (ref 35.0–45.0)
Hemoglobin: 13.1 g/dL (ref 11.7–15.5)
Lymphs Abs: 1018 cells/uL (ref 850–3900)
MCH: 28.6 pg (ref 27.0–33.0)
MCHC: 33.6 g/dL (ref 32.0–36.0)
MCV: 85.2 fL (ref 80.0–100.0)
MPV: 11.5 fL (ref 7.5–12.5)
Monocytes Relative: 8.6 %
Neutro Abs: 4154 cells/uL (ref 1500–7800)
Neutrophils Relative %: 64.9 %
Platelets: 226 10*3/uL (ref 140–400)
RBC: 4.58 10*6/uL (ref 3.80–5.10)
RDW: 14.4 % (ref 11.0–15.0)
Total Lymphocyte: 15.9 %
WBC: 6.4 10*3/uL (ref 3.8–10.8)

## 2018-06-27 LAB — GLUCOSE 6 PHOSPHATE DEHYDROGENASE: G-6PDH: 20.6 U/g Hgb — ABNORMAL HIGH (ref 7.0–20.5)

## 2018-06-27 LAB — RFLX HEXAGONAL PHASE CONFIRM: Hexagonal Phase Conf: NEGATIVE

## 2018-06-27 LAB — CK: Total CK: 298 U/L — ABNORMAL HIGH (ref 29–143)

## 2018-06-27 LAB — RFX DRVVT 1:1 MIX

## 2018-06-27 LAB — RFLX DRVVT CONFRIM: DRVVT CONFIRM: POSITIVE — AB

## 2018-06-28 NOTE — Progress Notes (Signed)
If we have a consent on Plaquenil then we can start her on Plaquenil.  She should also take enteric-coated aspirin 81 mg a day for positive lupus anticoagulant.

## 2018-06-30 DIAGNOSIS — H40023 Open angle with borderline findings, high risk, bilateral: Secondary | ICD-10-CM | POA: Diagnosis not present

## 2018-06-30 DIAGNOSIS — H40033 Anatomical narrow angle, bilateral: Secondary | ICD-10-CM | POA: Diagnosis not present

## 2018-06-30 DIAGNOSIS — H25093 Other age-related incipient cataract, bilateral: Secondary | ICD-10-CM | POA: Diagnosis not present

## 2018-06-30 DIAGNOSIS — H0102A Squamous blepharitis right eye, upper and lower eyelids: Secondary | ICD-10-CM | POA: Diagnosis not present
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
## 2018-07-08 ENCOUNTER — Encounter: Payer: Self-pay | Admitting: Rheumatology

## 2018-07-08 ENCOUNTER — Ambulatory Visit: Payer: BLUE CROSS/BLUE SHIELD | Admitting: Rheumatology

## 2018-07-08 VITALS — BP 121/59 | HR 71 | Resp 16 | Ht 62.0 in | Wt 196.4 lb

## 2018-07-08 DIAGNOSIS — M797 Fibromyalgia: Secondary | ICD-10-CM

## 2018-07-08 DIAGNOSIS — Z8269 Family history of other diseases of the musculoskeletal system and connective tissue: Secondary | ICD-10-CM

## 2018-07-08 DIAGNOSIS — M5030 Other cervical disc degeneration, unspecified cervical region: Secondary | ICD-10-CM

## 2018-07-08 DIAGNOSIS — R5383 Other fatigue: Secondary | ICD-10-CM

## 2018-07-08 DIAGNOSIS — M791 Myalgia, unspecified site: Secondary | ICD-10-CM | POA: Diagnosis not present

## 2018-07-08 DIAGNOSIS — M3219 Other organ or system involvement in systemic lupus erythematosus: Secondary | ICD-10-CM | POA: Diagnosis not present

## 2018-07-08 DIAGNOSIS — K760 Fatty (change of) liver, not elsewhere classified: Secondary | ICD-10-CM

## 2018-07-08 DIAGNOSIS — Z79899 Other long term (current) drug therapy: Secondary | ICD-10-CM | POA: Diagnosis not present

## 2018-07-08 DIAGNOSIS — R748 Abnormal levels of other serum enzymes: Secondary | ICD-10-CM

## 2018-07-08 DIAGNOSIS — E559 Vitamin D deficiency, unspecified: Secondary | ICD-10-CM

## 2018-07-08 DIAGNOSIS — K219 Gastro-esophageal reflux disease without esophagitis: Secondary | ICD-10-CM

## 2018-07-08 DIAGNOSIS — I10 Essential (primary) hypertension: Secondary | ICD-10-CM

## 2018-07-08 DIAGNOSIS — E669 Obesity, unspecified: Secondary | ICD-10-CM

## 2018-07-08 DIAGNOSIS — F5101 Primary insomnia: Secondary | ICD-10-CM

## 2018-07-08 DIAGNOSIS — M19041 Primary osteoarthritis, right hand: Secondary | ICD-10-CM

## 2018-07-08 DIAGNOSIS — M19042 Primary osteoarthritis, left hand: Secondary | ICD-10-CM

## 2018-07-08 DIAGNOSIS — M5136 Other intervertebral disc degeneration, lumbar region: Secondary | ICD-10-CM

## 2018-07-08 MED ORDER — ONDANSETRON HCL 4 MG PO TABS: 4.0000 mg | ORAL_TABLET | Freq: Three times a day (TID) | ORAL | 0 refills | Status: DC | PRN

## 2018-07-08 NOTE — Patient Instructions (Addendum)
Decrease dose to 200 mg every evening.  May take Zofran as needed for upset stomach.  Keep your appointment for a Plaquenil eye exam.  Labs will be drawn at next office visit in 3 months.

## 2018-07-08 NOTE — Progress Notes (Deleted)
Pharmacy Note  Subjective: Patient presents today to the [facility] to see Dr. Estanislado Pandy.  Patient seen by the pharmacist for counseling on methotrexate for {Rheumatology Indications:22208}. Prior therapy includes:***.  Objective: CBC    Component Value Date/Time   WBC 6.4 06/24/2018 1356   RBC 4.58 06/24/2018 1356   HGB 13.1 06/24/2018 1356   HGB 11.8 03/11/2018 0915   HCT 39.0 06/24/2018 1356   HCT 37.9 03/11/2018 0915   PLT 226 06/24/2018 1356   PLT 304 03/11/2018 0915   MCV 85.2 06/24/2018 1356   MCV 88 03/11/2018 0915   MCH 28.6 06/24/2018 1356   MCHC 33.6 06/24/2018 1356   RDW 14.4 06/24/2018 1356   RDW 15.6 (H) 03/11/2018 0915   LYMPHSABS 1,018 06/24/2018 1356   LYMPHSABS 1.0 03/11/2018 0915   MONOABS 496 01/28/2016 0950   EOSABS 640 (H) 06/24/2018 1356   EOSABS 0.0 03/11/2018 0915   BASOSABS 38 06/24/2018 1356   BASOSABS 0.0 03/11/2018 0915    CMP     Component Value Date/Time   NA 133 (L) 06/24/2018 1356   NA 137 03/11/2018 0915   K 3.3 (L) 06/24/2018 1356   CL 96 (L) 06/24/2018 1356   CO2 28 06/24/2018 1356   GLUCOSE 95 06/24/2018 1356   BUN 9 06/24/2018 1356   BUN 9 03/11/2018 0915   CREATININE 1.06 (H) 06/24/2018 1356   CALCIUM 8.9 06/24/2018 1356   CALCIUM 9.8 12/26/2010 1011   PROT 6.8 06/24/2018 1356   PROT 6.7 03/11/2018 0915   ALBUMIN 4.0 03/11/2018 0915   AST 25 06/24/2018 1356   ALT 27 06/24/2018 1356   ALKPHOS 85 03/11/2018 0915   BILITOT 0.6 06/24/2018 1356   BILITOT 0.5 03/11/2018 0915   GFRNONAA 58 (L) 06/24/2018 1356   GFRAA 67 06/24/2018 1356    Baseline Immunosuppressant Therapy Labs  TB Gold: pending 07/08/2018  Hepatitis Latest Ref Rng & Units 01/18/2015  Hep B Surface Ag NEGATIVE NEGATIVE  Hep B IgM NON REACTIVE NON REACTIVE  Hep C Ab NEGATIVE NEGATIVE  Hep A IgM NON REACTIVE NON REACTIVE    Lab Results  Component Value Date   HIV NONREACTIVE 01/18/2015   HIV NONREACTIVE 07/06/2014   Serum Protein  Electrophoresis Latest Ref Rng & Units 06/24/2018  Total Protein 6.1 - 8.1 g/dL 6.8  Albumin 3.8 - 4.8 g/dL -  Alpha-1 0.2 - 0.3 g/dL -  Alpha-2 0.5 - 0.9 g/dL -  Beta Globulin 0.4 - 0.6 g/dL -  Beta 2 0.2 - 0.5 g/dL -  Gamma Globulin 0.8 - 1.7 g/dL -    Lab Results  Component Value Date   G6PDH 20.6 (H) 06/24/2018   Immunoglobulins: pending 07/08/2018  Chest-xray:  No active disease 07/04/14  Contraception: post-menopausal  Alcohol use: ***  Assessment/Plan:   Patient was counseled on the purpose, proper use, and adverse effects of methotrexate including nausea, infection, and signs and symptoms of pneumonitis. Discussed that there is the possibility of an increased risk of malignancy, specifically lymphomas, but it is not well understood if this increased risk is due to the medication or the disease state.  Instructed patient that medication should be held for infection and prior to surgery.  Advised patient to avoid live vaccines. Recommend annual influenza, Pneumovax 23, Prevnar 13, and Shingrix as indicated.   Reviewed instructions with patient to take methotrexate weekly along with folic acid daily.  Discussed the importance of frequent monitoring of kidney and liver function and blood counts, and  provided patient with standing lab instructions.  Counseled patient to avoid NSAIDs and alcohol while on methotrexate.  Provided patient with educational materials on methotrexate and answered all questions.   Patient voiced understanding.  Patient consented to methotrexate use.  Will upload into chart.    Dose of methotrexate will be *** along with folic acid *** daily. Prescription pending ***.

## 2018-07-14 ENCOUNTER — Encounter: Payer: Self-pay | Admitting: Neurology

## 2018-07-14 ENCOUNTER — Other Ambulatory Visit: Payer: Self-pay | Admitting: *Deleted

## 2018-07-14 DIAGNOSIS — R748 Abnormal levels of other serum enzymes: Secondary | ICD-10-CM

## 2018-07-14 DIAGNOSIS — M791 Myalgia, unspecified site: Secondary | ICD-10-CM

## 2018-07-20 LAB — IGG, IGA, IGM
IgG (Immunoglobin G), Serum: 1206 mg/dL (ref 600–1640)
IgM, Serum: 50 mg/dL (ref 50–300)
Immunoglobulin A: 239 mg/dL (ref 47–310)

## 2018-07-20 LAB — MYOSITIS ASSESSR PLUS JO-1 AUTOABS
EJ Autoabs: NOT DETECTED
Jo-1 Autoabs: <1 AI
Ku Autoabs: NOT DETECTED
Mi-2 Autoabs: NOT DETECTED
OJ Autoabs: NOT DETECTED
PL-12 Autoabs: NOT DETECTED
PL-7 Autoabs: NOT DETECTED
SRP Autoabs: NOT DETECTED

## 2018-07-20 LAB — QUANTIFERON-TB GOLD PLUS
Mitogen-NIL: 7.06 IU/mL
NIL: 0.01 IU/mL
QuantiFERON-TB Gold Plus: NEGATIVE
TB1-NIL: 0 IU/mL
TB2-NIL: 0.01 IU/mL

## 2018-07-20 LAB — ALDOLASE: Aldolase: 4.7 U/L (ref <=8.1)

## 2018-07-20 LAB — THIOPURINE METHYLTRANSFERASE (TPMT), RBC: Thiopurine Methyltransferase, RBC: 15 nmol/hr/mL RBC

## 2018-07-22 DIAGNOSIS — H40033 Anatomical narrow angle, bilateral: Secondary | ICD-10-CM | POA: Diagnosis not present

## 2018-07-22 DIAGNOSIS — H2513 Age-related nuclear cataract, bilateral: Secondary | ICD-10-CM | POA: Diagnosis not present

## 2018-07-22 DIAGNOSIS — Z79899 Other long term (current) drug therapy: Secondary | ICD-10-CM | POA: Diagnosis not present

## 2018-07-22 DIAGNOSIS — L930 Discoid lupus erythematosus: Secondary | ICD-10-CM | POA: Diagnosis not present

## 2018-07-29 DIAGNOSIS — F331 Major depressive disorder, recurrent, moderate: Secondary | ICD-10-CM | POA: Diagnosis not present

## 2018-07-29 DIAGNOSIS — I10 Essential (primary) hypertension: Secondary | ICD-10-CM | POA: Diagnosis not present

## 2018-07-29 DIAGNOSIS — R7303 Prediabetes: Secondary | ICD-10-CM | POA: Diagnosis not present

## 2018-07-29 DIAGNOSIS — R945 Abnormal results of liver function studies: Secondary | ICD-10-CM | POA: Diagnosis not present

## 2018-08-02 ENCOUNTER — Ambulatory Visit (INDEPENDENT_AMBULATORY_CARE_PROVIDER_SITE_OTHER): Payer: BLUE CROSS/BLUE SHIELD | Admitting: Neurology

## 2018-08-02 DIAGNOSIS — R748 Abnormal levels of other serum enzymes: Secondary | ICD-10-CM | POA: Diagnosis not present

## 2018-08-02 DIAGNOSIS — M791 Myalgia, unspecified site: Secondary | ICD-10-CM

## 2018-08-02 NOTE — Procedures (Signed)
[facility]  [address], [address]  [place], [place] [address] Tel: [phone] Fax:  [phone] Test Date:  08/02/2018  Patient: Terri Hurley DOB: 25-Nov-1960 Physician: Narda Amber, DO  Sex: Female Height: 5\' 2"  Ref Phys: Bo Merino, MD  ID#: [id number] Temp: 34.0C Technician:    Patient Complaints: This is a 58 year old female with hyperCKemia and generalized myalgias referred for evaluation of bilateral arm pain.  NCV & EMG Findings: Extensive electrodiagnostic testing of the right upper extremity and additional studies of the left shows:  1. Bilateral median, ulnar, and mixed palmar sensory responses are within normal limits. 2. Bilateral median and ulnar motor responses are within normal limits. 3. There is no evidence of active or chronic motor axonal loss changes affecting any of the tested muscles.  Motor unit configuration and recruitment pattern is within normal limits.  Impression: This is a normal study of the upper extremities.  In particular, there is no evidence of a diffuse myopathy, carpal tunnel syndrome, or cervical radiculopathy.   ___________________________ Narda Amber, DO    Nerve Conduction Studies Anti Sensory Summary Table   Site NR Peak (ms) Norm Peak (ms) P-T Amp (V) Norm P-T Amp  Left Median Anti Sensory (2nd Digit)  34C  Wrist    3.0 <3.6 40.5 >15  Right Median Anti Sensory (2nd Digit)  34C  Wrist    2.8 <3.6 43.5 >15  Left Ulnar Anti Sensory (5th Digit)  34C  Wrist    2.2 <3.1 41.1 >10  Right Ulnar Anti Sensory (5th Digit)  34C  Wrist    2.0 <3.1 40.6 >10   Motor Summary Table   Site NR Onset (ms) Norm Onset (ms) O-P Amp (mV) Norm O-P Amp Site1 Site2 Delta-0 (ms) Dist (cm) Vel (m/s) Norm Vel (m/s)  Left Median Motor (Abd Poll Brev)  34C  Wrist    3.0 <4.0 14.7 >6 Elbow Wrist 4.6 25.0 54 >50  Elbow    7.6  14.3         Right Median Motor (Abd Poll Brev)  34C  Wrist    2.7 <4.0 13.1 >6 Elbow Wrist 4.3 25.0 58  >50  Elbow    7.0  12.5         Left Ulnar Motor (Abd Dig Minimi)  34C  Wrist    2.2 <3.1 11.6 >7 B Elbow Wrist 3.3 22.0 67 >50  B Elbow    5.5  11.1  A Elbow B Elbow 1.6 10.0 63 >50  A Elbow    7.1  11.1         Right Ulnar Motor (Abd Dig Minimi)  34C  Wrist    1.8 <3.1 10.9 >7 B Elbow Wrist 3.0 20.0 67 >50  B Elbow    4.8  9.9  A Elbow B Elbow 1.6 10.0 62 >50  A Elbow    6.4  8.5          Comparison Summary Table   Site NR Peak (ms) Norm Peak (ms) P-T Amp (V) Site1 Site2 Delta-P (ms) Norm Delta (ms)  Left Median/Ulnar Palm Comparison (Wrist - 8cm)  34C  Median Palm    1.8 <2.2 98.9 Median Palm Ulnar Palm 0.2   Ulnar Palm    1.6 <2.2 22.4      Right Median/Ulnar Palm Comparison (Wrist - 8cm)  34C  Median Palm    1.9 <2.2 42.3 Median Palm Ulnar Palm 0.3   Ulnar Palm  1.6 <2.2 17.3       EMG   Side Muscle Ins Act Fibs Psw Fasc Number Recrt Dur Dur. Amp Amp. Poly Poly. Comment  Right 1stDorInt Nml Nml Nml Nml Nml Nml Nml Nml Nml Nml Nml Nml N/A  Right Abd Poll Brev Nml Nml Nml Nml Nml Nml Nml Nml Nml Nml Nml Nml N/A  Right Biceps Nml Nml Nml Nml Nml Nml Nml Nml Nml Nml Nml Nml N/A  Right PronatorTeres Nml Nml Nml Nml Nml Nml Nml Nml Nml Nml Nml Nml N/A  Right Triceps Nml Nml Nml Nml Nml Nml Nml Nml Nml Nml Nml Nml N/A  Right Deltoid Nml Nml Nml Nml Nml Nml Nml Nml Nml Nml Nml Nml N/A  Left 1stDorInt Nml Nml Nml Nml Nml Nml Nml Nml Nml Nml Nml Nml N/A  Left Abd Poll Brev Nml Nml Nml Nml Nml Nml Nml Nml Nml Nml Nml Nml N/A  Left PronatorTeres Nml Nml Nml Nml Nml Nml Nml Nml Nml Nml Nml Nml N/A  Left Biceps Nml Nml Nml Nml Nml Nml Nml Nml Nml Nml Nml Nml N/A  Left Triceps Nml Nml Nml Nml Nml Nml Nml Nml Nml Nml Nml Nml N/A  Left Deltoid Nml Nml Nml Nml Nml Nml Nml Nml Nml Nml Nml Nml N/A      Waveforms:

## 2018-08-05 ENCOUNTER — Ambulatory Visit: Payer: BLUE CROSS/BLUE SHIELD | Admitting: Rheumatology

## 2018-08-21 ENCOUNTER — Encounter: Payer: Self-pay | Admitting: Rheumatology

## 2018-09-02 ENCOUNTER — Other Ambulatory Visit: Payer: Self-pay | Admitting: Pharmacist

## 2018-09-02 DIAGNOSIS — M3219 Other organ or system involvement in systemic lupus erythematosus: Secondary | ICD-10-CM

## 2018-09-02 MED ORDER — HYDROXYCHLOROQUINE SULFATE 200 MG PO TABS: 200.0000 mg | ORAL_TABLET | Freq: Two times a day (BID) | ORAL | 0 refills | Status: DC

## 2018-09-02 NOTE — Progress Notes (Signed)
Patient called and left voicemail requesting refill for Plaquenil to [name] on [address].   Last Visit: 07/08/2018 Next Visit: 10/07/2018 Labs: 06/24/2018 Eye exam: 07/22/2018  Prescription sent to pharmacy.  Mariella Saa, PharmD, [facility] Clinical Pharmacist  09/02/2018 12:16 PM

## 2018-09-03 ENCOUNTER — Other Ambulatory Visit: Payer: Self-pay | Admitting: Rheumatology

## 2018-09-03 DIAGNOSIS — M3219 Other organ or system involvement in systemic lupus erythematosus: Secondary | ICD-10-CM

## 2018-09-07 ENCOUNTER — Encounter: Payer: Self-pay | Admitting: Rheumatology

## 2018-10-07 ENCOUNTER — Ambulatory Visit: Payer: BLUE CROSS/BLUE SHIELD | Admitting: Rheumatology

## 2018-10-13 ENCOUNTER — Encounter: Payer: Self-pay | Admitting: Rheumatology

## 2018-10-14 ENCOUNTER — Encounter: Payer: Self-pay | Admitting: *Deleted

## 2018-10-14 NOTE — Telephone Encounter (Signed)
OK to give a letter that patient is immunosuppressed. We advise her to work from home.

## 2018-10-24 DIAGNOSIS — M797 Fibromyalgia: Secondary | ICD-10-CM | POA: Diagnosis not present

## 2018-10-24 DIAGNOSIS — F331 Major depressive disorder, recurrent, moderate: Secondary | ICD-10-CM | POA: Diagnosis not present

## 2018-10-25 ENCOUNTER — Encounter: Payer: Self-pay | Admitting: Rheumatology
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
## 2018-11-02 ENCOUNTER — Ambulatory Visit: Payer: BLUE CROSS/BLUE SHIELD | Admitting: Physician Assistant

## 2018-11-02 ENCOUNTER — Encounter: Payer: Self-pay | Admitting: Physician Assistant

## 2018-11-02 ENCOUNTER — Other Ambulatory Visit: Payer: Self-pay

## 2018-11-02 VITALS — BP 134/85 | HR 74 | Resp 15 | Ht 61.5 in | Wt 204.0 lb

## 2018-11-02 DIAGNOSIS — M791 Myalgia, unspecified site: Secondary | ICD-10-CM

## 2018-11-02 DIAGNOSIS — Z8269 Family history of other diseases of the musculoskeletal system and connective tissue: Secondary | ICD-10-CM

## 2018-11-02 DIAGNOSIS — M5136 Other intervertebral disc degeneration, lumbar region: Secondary | ICD-10-CM

## 2018-11-02 DIAGNOSIS — M5030 Other cervical disc degeneration, unspecified cervical region: Secondary | ICD-10-CM

## 2018-11-02 DIAGNOSIS — M3219 Other organ or system involvement in systemic lupus erythematosus: Secondary | ICD-10-CM | POA: Diagnosis not present

## 2018-11-02 DIAGNOSIS — E559 Vitamin D deficiency, unspecified: Secondary | ICD-10-CM

## 2018-11-02 DIAGNOSIS — M19042 Primary osteoarthritis, left hand: Secondary | ICD-10-CM

## 2018-11-02 DIAGNOSIS — R5383 Other fatigue: Secondary | ICD-10-CM

## 2018-11-02 DIAGNOSIS — Z79899 Other long term (current) drug therapy: Secondary | ICD-10-CM | POA: Diagnosis not present

## 2018-11-02 DIAGNOSIS — M797 Fibromyalgia: Secondary | ICD-10-CM | POA: Diagnosis not present

## 2018-11-02 DIAGNOSIS — M19041 Primary osteoarthritis, right hand: Secondary | ICD-10-CM

## 2018-11-02 DIAGNOSIS — F5101 Primary insomnia: Secondary | ICD-10-CM

## 2018-11-02 DIAGNOSIS — I10 Essential (primary) hypertension: Secondary | ICD-10-CM

## 2018-11-02 DIAGNOSIS — K760 Fatty (change of) liver, not elsewhere classified: Secondary | ICD-10-CM

## 2018-11-02 DIAGNOSIS — K219 Gastro-esophageal reflux disease without esophagitis: Secondary | ICD-10-CM

## 2018-11-02 DIAGNOSIS — R748 Abnormal levels of other serum enzymes: Secondary | ICD-10-CM

## 2018-11-11 DIAGNOSIS — M797 Fibromyalgia: Secondary | ICD-10-CM | POA: Diagnosis not present

## 2018-11-11 DIAGNOSIS — F331 Major depressive disorder, recurrent, moderate: Secondary | ICD-10-CM | POA: Diagnosis not present

## 2018-11-11 DIAGNOSIS — I10 Essential (primary) hypertension: Secondary | ICD-10-CM | POA: Diagnosis not present

## 2018-11-25 ENCOUNTER — Ambulatory Visit: Payer: Self-pay | Admitting: Physician Assistant

## 2018-11-28 DIAGNOSIS — M797 Fibromyalgia: Secondary | ICD-10-CM | POA: Diagnosis not present

## 2018-11-28 DIAGNOSIS — I10 Essential (primary) hypertension: Secondary | ICD-10-CM | POA: Diagnosis not present

## 2018-11-28 DIAGNOSIS — F331 Major depressive disorder, recurrent, moderate: Secondary | ICD-10-CM | POA: Diagnosis not present

## 2018-11-28 DIAGNOSIS — E871 Hypo-osmolality and hyponatremia: Secondary | ICD-10-CM | POA: Diagnosis not present

## 2018-12-08 ENCOUNTER — Other Ambulatory Visit: Payer: Self-pay | Admitting: Family Medicine

## 2018-12-08 DIAGNOSIS — Z1231 Encounter for screening mammogram for malignant neoplasm of breast: Secondary | ICD-10-CM

## 2018-12-15 DIAGNOSIS — F4323 Adjustment disorder with mixed anxiety and depressed mood: Secondary | ICD-10-CM | POA: Diagnosis not present

## 2018-12-25 ENCOUNTER — Other Ambulatory Visit: Payer: Self-pay | Admitting: Rheumatology

## 2018-12-25 DIAGNOSIS — M3219 Other organ or system involvement in systemic lupus erythematosus: Secondary | ICD-10-CM

## 2018-12-26 NOTE — Telephone Encounter (Signed)
Last Visit: 11/02/18 Next Visit: 04/07/19 Labs: 06/24/18 Creat. 1.06 GFR 58 Sodium 133 Potassium 3.3 Ch;pride 96 Eye exam: 07/22/2018 normal.   Okay to refill Plaquenil?

## 2018-12-26 NOTE — Telephone Encounter (Signed)
Please advise patient to update lab work.   Ok to refill 30-day supply of PLQ.

## 2018-12-26 NOTE — Telephone Encounter (Signed)
Left message to advise patient she is due for labs.  

## 2018-12-27 ENCOUNTER — Other Ambulatory Visit: Payer: Self-pay

## 2018-12-27 DIAGNOSIS — E559 Vitamin D deficiency, unspecified: Secondary | ICD-10-CM

## 2018-12-27 DIAGNOSIS — F4323 Adjustment disorder with mixed anxiety and depressed mood: Secondary | ICD-10-CM | POA: Diagnosis not present

## 2018-12-27 DIAGNOSIS — M3219 Other organ or system involvement in systemic lupus erythematosus: Secondary | ICD-10-CM

## 2018-12-27 DIAGNOSIS — Z79899 Other long term (current) drug therapy: Secondary | ICD-10-CM | POA: Diagnosis not present

## 2018-12-28 ENCOUNTER — Telehealth: Payer: Self-pay | Admitting: *Deleted

## 2018-12-28 DIAGNOSIS — M51369 Other intervertebral disc degeneration, lumbar region without mention of lumbar back pain or lower extremity pain: Secondary | ICD-10-CM

## 2018-12-28 DIAGNOSIS — G8929 Other chronic pain: Secondary | ICD-10-CM

## 2018-12-28 DIAGNOSIS — M5136 Other intervertebral disc degeneration, lumbar region: Secondary | ICD-10-CM

## 2018-12-28 DIAGNOSIS — E559 Vitamin D deficiency, unspecified: Secondary | ICD-10-CM

## 2018-12-28 DIAGNOSIS — M545 Low back pain, unspecified: Secondary | ICD-10-CM

## 2018-12-28 LAB — COMPLETE METABOLIC PANEL WITH GFR
AG Ratio: 1.3 (calc) (ref 1.0–2.5)
ALT: 26 U/L (ref 6–29)
AST: 24 U/L (ref 10–35)
Albumin: 3.8 g/dL (ref 3.6–5.1)
Alkaline phosphatase (APISO): 67 U/L (ref 37–153)
BUN/Creatinine Ratio: 9 (calc) (ref 6–22)
BUN: 10 mg/dL (ref 7–25)
CO2: 29 mmol/L (ref 20–32)
Calcium: 9 mg/dL (ref 8.6–10.4)
Chloride: 100 mmol/L (ref 98–110)
Creat: 1.09 mg/dL — ABNORMAL HIGH (ref 0.50–1.05)
GFR, Est African American: 65 mL/min/{1.73_m2} (ref >=60)
GFR, Est Non African American: 56 mL/min/{1.73_m2} — ABNORMAL LOW (ref >=60)
Globulin: 2.9 g/dL (calc) (ref 1.9–3.7)
Glucose, Bld: 156 mg/dL — ABNORMAL HIGH (ref 65–99)
Potassium: 3.9 mmol/L (ref 3.5–5.3)
Sodium: 138 mmol/L (ref 135–146)
Total Bilirubin: 0.4 mg/dL (ref 0.2–1.2)
Total Protein: 6.7 g/dL (ref 6.1–8.1)

## 2018-12-28 LAB — CBC WITH DIFFERENTIAL/PLATELET
Absolute Monocytes: 400 cells/uL (ref 200–950)
Basophils Absolute: 38 cells/uL (ref 0–200)
Basophils Relative: 0.7 %
Eosinophils Absolute: 70 cells/uL (ref 15–500)
Eosinophils Relative: 1.3 %
HCT: 37.9 % (ref 35.0–45.0)
Hemoglobin: 12.3 g/dL (ref 11.7–15.5)
Lymphs Abs: 1242 cells/uL (ref 850–3900)
MCH: 28.5 pg (ref 27.0–33.0)
MCHC: 32.5 g/dL (ref 32.0–36.0)
MCV: 87.7 fL (ref 80.0–100.0)
MPV: 11 fL (ref 7.5–12.5)
Monocytes Relative: 7.4 %
Neutro Abs: 3650 cells/uL (ref 1500–7800)
Neutrophils Relative %: 67.6 %
Platelets: 286 10*3/uL (ref 140–400)
RBC: 4.32 10*6/uL (ref 3.80–5.10)
RDW: 14.2 % (ref 11.0–15.0)
Total Lymphocyte: 23 %
WBC: 5.4 10*3/uL (ref 3.8–10.8)

## 2018-12-28 LAB — URINALYSIS, ROUTINE W REFLEX MICROSCOPIC
Bilirubin Urine: NEGATIVE
Glucose, UA: NEGATIVE
Hgb urine dipstick: NEGATIVE
Ketones, ur: NEGATIVE
Leukocytes,Ua: NEGATIVE
Nitrite: NEGATIVE
Protein, ur: NEGATIVE
Specific Gravity, Urine: 1.018 (ref 1.001–1.03)
pH: 6 (ref 5.0–8.0)

## 2018-12-28 LAB — C3 AND C4
C3 Complement: 136 mg/dL (ref 83–193)
C4 Complement: 17 mg/dL (ref 15–57)

## 2018-12-28 LAB — SEDIMENTATION RATE: Sed Rate: 9 mm/h (ref 0–30)

## 2018-12-28 LAB — ANTI-DNA ANTIBODY, DOUBLE-STRANDED: ds DNA Ab: 8 IU/mL — ABNORMAL HIGH

## 2018-12-28 LAB — VITAMIN D 25 HYDROXY (VIT D DEFICIENCY, FRACTURES): Vit D, 25-Hydroxy: 20 ng/mL — ABNORMAL LOW (ref 30–100)

## 2018-12-28 MED ORDER — VITAMIN D (ERGOCALCIFEROL) 1.25 MG (50000 UNIT) PO CAPS: 50000.0000 [IU] | ORAL_CAPSULE | ORAL | 0 refills | Status: DC

## 2018-12-28 NOTE — Telephone Encounter (Signed)
-----   Message from Bo Merino, MD sent at 12/28/2018  9:01 AM EDT ----- Glucose is elevated, GFR is low but is stable.  Her vitamin D is low.  We would like to keep her vitamin D around 40.  Please place her on vitamin D 50,000 units once a week for 3 months.  We will repeat vitamin D level in 3 months.  Other labs are p ending at this time.

## 2018-12-28 NOTE — Telephone Encounter (Signed)
When reviewing lab work with patient she states she is having pain in her lower back to her buttocks as well as her arms and upper torso. Patient states she has been :hurting really bad". Patient state sshe is also having some pain in her knees. Patient states it hurts to sit and she is having trouble sleeping. Patient is on PLQ BID. Patient was last seen 11/02/18 and due to follow up 04/07/19. Please advise.

## 2018-12-28 NOTE — Telephone Encounter (Signed)
Please advise patient to see back specialist for neck and lower back pain.  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

## 2018-12-28 NOTE — Telephone Encounter (Signed)
Patient advised to see back specialist for neck and lower back pain.  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 Patient advised will place referral as she has not seen a back specialist before.

## 2018-12-28 NOTE — Progress Notes (Signed)
Glucose is elevated, GFR is low but is stable.  Her vitamin D is low.  We would like to keep her vitamin D around 40.  Please place her on vitamin D 50,000 units once a week for 3 months.  We will repeat vitamin D level in 3 months.  Other labs are pending at this time.

## 2018-12-29 NOTE — Addendum Note (Signed)
Addended by: Carole Binning on: 12/29/2018 11:35 AM   Modules accepted: Orders

## 2018-12-29 NOTE — Addendum Note (Signed)
Addended by: Carole Binning on: 12/29/2018 01:56 PM   Modules accepted: Orders

## 2019-01-10 ENCOUNTER — Ambulatory Visit: Payer: Self-pay

## 2019-01-10 ENCOUNTER — Encounter: Payer: Self-pay | Admitting: Orthopaedic Surgery

## 2019-01-10 ENCOUNTER — Ambulatory Visit (INDEPENDENT_AMBULATORY_CARE_PROVIDER_SITE_OTHER): Payer: BC Managed Care – PPO | Admitting: Orthopaedic Surgery

## 2019-01-10 VITALS — Ht 61.5 in | Wt 204.0 lb

## 2019-01-10 DIAGNOSIS — M545 Low back pain, unspecified: Secondary | ICD-10-CM

## 2019-01-10 DIAGNOSIS — G8929 Other chronic pain: Secondary | ICD-10-CM

## 2019-01-10 DIAGNOSIS — F4323 Adjustment disorder with mixed anxiety and depressed mood: Secondary | ICD-10-CM | POA: Diagnosis not present

## 2019-01-10 NOTE — Progress Notes (Signed)
Office Visit Note   Patient: Terri Hurley           Date of Birth: May 30, 1961           MRN: [id number] Visit Date: 01/10/2019              Requested by: Bo Merino, MD [address] [place],  [place] [address] PCP: [name], MD   Assessment & Plan: Visit Diagnoses:  1. Chronic bilateral low back pain, unspecified whether sciatica present     Plan: We will refer patient for physical therapy for treatment of her low back pain.  I plan to recheck her again in 1 month.  On return visit we will obtain an AP and lateral cervical spine x-ray and then consider lumbar MRI imaging if she is not improved with physical therapy.  I recommend she take Tylenol since she is had a history of GERD stomach problems and problems with ibuprofen.  Follow-Up Instructions: Return in about 1 month (around 02/10/2019).   Orders:  Orders Placed This Encounter  Procedures  . XR Lumbar Spine 2-3 Views  . XR Pelvis 1-2 Views  . Ambulatory referral to Physical Therapy   No orders of the defined types were placed in this encounter.     Procedures: No procedures performed   Clinical Data: No additional findings.   Subjective: Chief Complaint  Patient presents with  . Lower Back - Pain    HPI 58 year old female with reported diagnosis fibromyalgia depression with systemic lupus dermatosis complains of pain across her lower back that radiated into her buttocks.  She states she had a history of back problems for years.  3 weeks ago got worse some days are worse than others some days she has difficulty sitting down she states her whole body aches.  She states she has some shooting pain in her legs describes it as a dull aching pain like a toothache she does have some lower extremity tingling.  She had cervical spine surgery at [facility] in December 2015 but has not had recent follow-up.  She states her neck gives her some problems but has more problems with her back than her neck.   Patient's been on Neurontin and noted some improvement.  Review of Systems positive for fibromyalgia plantar fasciitis.  Previous cervical fusion.  Lumbar disc degeneration.  History of fatty liver, systemic lupus, hypertension vitamin D deficiency.   Objective: Vital Signs: Ht 5' 1.5" (1.562 m)   Wt 204 lb (92.5 kg)   BMI 37.92 kg/m   Physical Exam Constitutional:      Appearance: She is well-developed.  HENT:     Head: Normocephalic.     Right Ear: External ear normal.     Left Ear: External ear normal.  Eyes:     Pupils: Pupils are equal, round, and reactive to light.  Neck:     Thyroid: No thyromegaly.     Trachea: No tracheal deviation.  Cardiovascular:     Rate and Rhythm: Normal rate.  Pulmonary:     Effort: Pulmonary effort is normal.  Abdominal:     Palpations: Abdomen is soft.  Skin:    General: Skin is warm and dry.  Neurological:     Mental Status: She is alert and oriented to person, place, and time.  Psychiatric:        Behavior: Behavior normal.     Ortho Exam patient has healed anterior cervical incision.  Mild tenderness brachial plexus negative Spurling  upper extremity reflexes are 2+ and symmetrical knee and ankle jerk are intact.  She is able to heel and toe walk anterior tib gastrocsoleus are normal distal pulses are 2+.  No pitting edema no plantar foot lesions.  No lower extremity atrophy.  Negative logroll to the hips.  Specialty Comments:  No specialty comments available.  Imaging: No results found.   PMFS History: Patient Active Problem List   Diagnosis Date Noted  . SLE (systemic lupus erythematosus) ([place])   . Primary osteoarthritis of both hands 10/01/2017  . DDD (degenerative disc disease), cervical 08/23/2017  . DDD (degenerative disc disease), lumbar 08/23/2017  . Essential hypertension 01/28/2016  . Incontinence of urine 12/26/2010  . Depression   . GERD (gastroesophageal reflux disease)   . Fatty liver disease, nonalcoholic    . HNP (herniated nucleus pulposus), cervical   . Recurrent genital HSV (herpes simplex virus) infection   . Plantar fasciitis   . Elevated liver enzymes 12/12/2010  . Vitamin D deficiency 12/12/2010   Past Medical History:  Diagnosis Date  . Arthritis    low back, hip, hands  . Depression    Sees [name], NP @ [facility]  counseling center.Marland Kitchen Has h/o hospitalization  . Fatty liver disease, nonalcoholic    clinical diagnosis by endocrinologist  . Fibromyalgia   . GERD (gastroesophageal reflux disease)    controlled with PPI therapy  . Hepatitis unknown type   . High cholesterol    No medical therapy. Last lab March '12  LDL 91, T. Chol 170. Minimal elevation in '08  . HNP (herniated nucleus pulposus), cervical    C6-7. Has had PT, no surgery  . Plantar fasciitis   . Recurrent genital HSV (herpes simplex virus) infection    on daily suppression with acyclovir  . SLE (systemic lupus erythematosus) (HCC)    sees rheum, on plaquenil  . Urinary incontinence   . Vitamin D deficiency    lab '09  Vit D = 19    Family History  Problem Relation Age of Onset  . Heart disease Mother 56       AMI  . Diabetes Mother        peripheral neuropathy  . Hypertension Mother   . Hyperlipidemia Mother   . Mental illness Mother        depression and anxiety  . Gout Mother   . Diabetes Father   . Heart disease Father   . Hyperlipidemia Father   . Mental illness Father        depression and anxiety  . Heart disease Brother        arrythmia, DCC  . Hyperlipidemia Brother   . Mental illness Brother   . Cancer Sister 43       breast cancer  . Mental illness Sister   . Asthma Sister   . Thyroid disease Sister   . COPD Sister   . Mental illness Sister   . COPD Brother   . Stroke Brother   . Hyperlipidemia Brother   . Hypertension Brother   . Mental illness Brother   . Mental illness Sister   . Mental illness Brother   . Mental illness Brother   . GER disease Son     Past  Surgical History:  Procedure Laterality Date  . CHOLECYSTECTOMY    . INCONTINENCE SURGERY  2009  . SPINE SURGERY  05/2014   Cervical spine surgery C4-5, C5-6.  [place].  Marland Kitchen TOTAL ABDOMINAL HYSTERECTOMY W/ BILATERAL SALPINGOOPHORECTOMY  2009   with repair of cystocele and rectocele    Social History   Occupational History  . Occupation: HAIR DRESSER  Tobacco Use  . Smoking status: Never Smoker  . Smokeless tobacco: Never Used  Substance and Sexual Activity  . Alcohol use: No  . Drug use: No  . Sexual activity: Never    Birth control/protection: Surgical, Post-menopausal

## 2019-01-13 ENCOUNTER — Encounter: Payer: Self-pay | Admitting: Physical Therapy

## 2019-01-13 ENCOUNTER — Other Ambulatory Visit: Payer: Self-pay

## 2019-01-13 ENCOUNTER — Ambulatory Visit: Payer: BC Managed Care – PPO | Attending: Orthopaedic Surgery | Admitting: Physical Therapy

## 2019-01-13 DIAGNOSIS — M5440 Lumbago with sciatica, unspecified side: Secondary | ICD-10-CM | POA: Diagnosis not present

## 2019-01-13 DIAGNOSIS — G8929 Other chronic pain: Secondary | ICD-10-CM

## 2019-01-13 DIAGNOSIS — R2689 Other abnormalities of gait and mobility: Secondary | ICD-10-CM | POA: Diagnosis not present

## 2019-01-13 DIAGNOSIS — M62838 Other muscle spasm: Secondary | ICD-10-CM | POA: Diagnosis not present

## 2019-01-13 NOTE — Therapy (Addendum)
[place], [place], [address] Phone: [phone]   Fax:  [phone]  Physical Therapy Evaluation/Discharge   Patient Details  Name: Terri Hurley MRN: [id number] Date of Birth: 17-Dec-1960 Referring Provider (PT): Dr Rodell Perna    Encounter Date: 01/13/2019  PT End of Session - 01/13/19 1136    Visit Number  1    Number of Visits  6    Date for PT Re-Evaluation  02/24/19    Authorization Type  [employer]    PT Start Time  1000    PT Stop Time  1040    PT Time Calculation (min)  40 min    Activity Tolerance  Patient tolerated treatment well    Behavior During Therapy  [facility] for tasks assessed/performed       Past Medical History:  Diagnosis Date  . Arthritis    low back, hip, hands  . Depression    Sees [name], NP @ [facility]  counseling center.Marland Kitchen Has h/o hospitalization  . Fatty liver disease, nonalcoholic    clinical diagnosis by endocrinologist  . Fibromyalgia   . GERD (gastroesophageal reflux disease)    controlled with PPI therapy  . Hepatitis unknown type   . High cholesterol    No medical therapy. Last lab March '12  LDL 91, T. Chol 170. Minimal elevation in '08  . HNP (herniated nucleus pulposus), cervical    C6-7. Has had PT, no surgery  . Plantar fasciitis   . Recurrent genital HSV (herpes simplex virus) infection    on daily suppression with acyclovir  . SLE (systemic lupus erythematosus) (HCC)    sees rheum, on plaquenil  . Urinary incontinence   . Vitamin D deficiency    lab '09  Vit D = 19    Past Surgical History:  Procedure Laterality Date  . CHOLECYSTECTOMY    . INCONTINENCE SURGERY  2009  . SPINE SURGERY  05/2014   Cervical spine surgery C4-5, C5-6.  [place].  Marland Kitchen TOTAL ABDOMINAL HYSTERECTOMY W/ BILATERAL SALPINGOOPHORECTOMY  2009   with repair of cystocele and rectocele     There were no vitals filed for this visit.   Subjective Assessment -  01/13/19 1003    Subjective  Patient has had lower back pain for several years. Over the past month she has had increased pain in her lower back. She has more pain when she walks or sits. She has days when she hurts all day.    Pertinent History  major depressive disorder    Limitations  Standing;Walking    How long can you sit comfortably?  has to shift frequently in her chair    How long can you stand comfortably?  no limit    How long can you walk comfortably?  limited ability to perfrom ADL's    Diagnostic tests  X-ray: 20 degree curvature to the right    Patient Stated Goals  to have less pain    Currently in Pain?  Yes    Pain Score  8     Pain Location  Back    Pain Orientation  Right;Left;Lower    Pain Descriptors / Indicators  Aching    Pain Type  Chronic pain    Pain Onset  More than a month ago    Pain Frequency  Constant    Aggravating Factors   walking and sitting    Pain Relieving Factors  Nothing seems to make it  better    Effect of Pain on Daily Activities  difficulty perfroming daily tasks         [facility] PT Assessment - 01/13/19 0001      Assessment   Medical Diagnosis  Low back Pain     Referring Provider (PT)  Dr Rodell Perna     Onset Date/Surgical Date  --   years ago but acute onset 3 months ago    Hand Dominance  Right    Next MD Visit  8/.21/2020     Prior Therapy  yesrs ago but it did not help       Precautions   Precautions  None      Restrictions   Weight Bearing Restrictions  No      Balance Screen   Has the patient fallen in the past 6 months  No    Has the patient had a decrease in activity level because of a fear of falling?   No    Is the patient reluctant to leave their home because of a fear of falling?   No      Home Environment   Additional Comments  Nothing significant       Prior Function   Level of Independence  Independent    Vocation  Unemployed    Leisure  unable to move around and do any activity       Cognition   Overall  Cognitive Status  Within Functional Limits for tasks assessed    Attention  Focused    Focused Attention  Appears intact    Memory  Appears intact    Awareness  Appears intact    Problem Solving  Appears intact      Observation/Other Assessments   Observations  has to shift frequently in her chair.     Scoliosis  right lumbar curvature     Focus on Therapeutic Outcomes (FOTO)   69% limitation 51% limitation expected       Sensation   Light Touch  Appears Intact    Additional Comments  bilateral legtingling       Coordination   Gross Motor Movements are Fluid and Coordinated  Yes    Fine Motor Movements are Fluid and Coordinated  Yes      Posture/Postural Control   Posture Comments  sits with slupmed posture, flat lumbar lordosis       ROM / Strength   AROM / PROM / Strength  AROM;PROM;Strength      AROM   AROM Assessment Site  Lumbar    Lumbar Flexion  limited 75%     Lumbar Extension  limited 50%     Lumbar - Right Rotation  limited 50%     Lumbar - Left Rotation  limited 50%       PROM   Overall PROM Comments  full passive ROM bilateral       Strength   Strength Assessment Site  Hip;Knee    Right/Left Hip  Right;Left    Right Hip Flexion  3/5    Right Hip ABduction  3/5    Right Hip ADduction  3+/5    Left Hip Flexion  3+/5    Left Hip ABduction  3/5    Left Hip ADduction  3+/5    Right/Left Knee  Right;Left    Right Knee Flexion  3+/5    Right Knee Extension  3/5    Left Knee Flexion  3+/5    Left Knee Extension  3/5      Palpation   Spinal mobility  unable to assess     Palpation comment  significant adversion to light touch in the lumbar spine. Patient given a tennis ball to work on de-sensatizing her lower back to light touch so manual therapy could be perfromed       Special Tests   Other special tests  unable to perfrom 2nd to high pain levels       Bed Mobility   Bed Mobility  Supine to Sit    Supine to Sit  Minimal Assistance - Patient > 75%    min a for proper log roll and to sit up      Transfers   Comments  slow transfer from sit to stand       Ambulation/Gait   Gait Comments  significant lateral movement noted with ambualtion                 Objective measurements completed on examination: See above findings.      [place] [facility] PT Treatment/Exercise - 01/13/19 0001      Exercises   Exercises  Lumbar      Lumbar Exercises: Stretches   Lower Trunk Rotation Limitations  in low range x5     Piriformis Stretch Limitations  2x20 sec hold patient has suprisingly good hip mobility       Lumbar Exercises: Seated   Other Seated Lumbar Exercises  tennis ball trigger point release mod cuing for pressure     Other Seated Lumbar Exercises  seated abdominal breathing max cuing. Patient reports it is very hard to breath and tighten her stomach              PT Education - 01/13/19 1015    Education Details  HEP, symptom mangement, improtance of movement    Person(s) Educated  Patient    Methods  Explanation;Demonstration;Tactile cues;Verbal cues    Comprehension  Verbalized understanding;Returned demonstration;Verbal cues required;Tactile cues required       PT Short Term Goals - 01/13/19 1256      PT SHORT TERM GOAL #1   Title  Patient will perfrom core contraction without cuing    Time  3    Period  Weeks    Status  New    Target Date  02/03/19      PT SHORT TERM GOAL #2   Title  Patient will increase bilateral LE strength to 4/5    Time  3    Period  Weeks    Status  New    Target Date  02/03/19      PT SHORT TERM GOAL #3   Title  Patient will increase lumbar spine flexion by 25%    Time  3    Period  Weeks    Status  New    Target Date  02/03/19        PT Long Term Goals - 01/13/19 1258      PT LONG TERM GOAL #1   Title  Patient will stand for 20 minutes without pain    Time  6    Period  Weeks    Status  New    Target Date  02/24/19      PT LONG TERM GOAL #2   Title  Patient  will sit for 2o minuteswithout having to shift 2nd to pain    Time  6    Period  Weeks    Status  New  Target Date  02/24/19      PT LONG TERM GOAL #3   Title  Patient will demonstrate a 51% limitation on FOTO    Time  6    Period  Weeks    Status  New    Target Date  02/24/19             Plan - 01/13/19 1143    Clinical Impression Statement  Patient is a 58 year old female with a long history of lower back pain that has increased over the past month. MRI shows scoliosis and degeneration at L3-L4 and L2-L3. PMH includes lupsu, fibromyalgia and depressive disorder. She has significant wekaness in bilateral hips and core. She sits in poor posture and reports increased pain when her posture improves. She frequently shiftis in her chair. She would benefit from skilled therapy to imporve LE strength, gait pattern, and to develope techniques to control her pain. Therapy reviewed breathing and relaxation techniques today and began basic pain neuro education. She will need re-enforcement of PNE concpts but unfourtunetly she has a high co-pay and deductable. She was seen for a high complexity eval.    Personal Factors and Comorbidities  Comorbidity 1;Comorbidity 2;Comorbidity 3+;Past/Current Experience;Finances    Comorbidities  obesity; fibromyalgia, depression, anxiety, cervical spine surgery, scoliosis,    Examination-Activity Limitations  Bed Mobility;Squat;Locomotion Level;Stand;Carry;Sit    Examination-Participation Restrictions  Community Activity;Yard Work    Stability/Clinical Decision Making  Unstable/Unpredictable   fluctuating pain levels that increasing, increased tingling in legs   Clinical Decision Making  Moderate    Rehab Potential  Fair   all co-mobidities listed above   PT Frequency  1x / week    PT Duration  6 weeks    PT Treatment/Interventions  ADLs/Self Care Home Management;Cryotherapy;Electrical Stimulation;Iontophoresis 17m/ml Dexamethasone;Ultrasound;DME  Instruction;Functional mobility training;Therapeutic exercise;Neuromuscular re-education;Patient/family education;Manual techniques;Passive range of motion;Taping;Energy conservation    PT Next Visit Plan  soft tissue mobilization if able to tolerate. any type of light core strengthening she can tolerate, consider modalities and if tolerated give her infor on the tens. Continue pain-neuro education she has frequent bouts of whole body pain, encourage general health and wellness concepts; add hamstring stretching, continue to montor gait, continue clamshell    PT Home Exercise Plan  decompression position, lower trunk rotation, piriformis stretch    Consulted and Agree with Plan of Care  Patient       Patient will benefit from skilled therapeutic intervention in order to improve the following deficits and impairments:  Abnormal gait, Improper body mechanics, Postural dysfunction, Decreased activity tolerance, Decreased knowledge of use of DME, Decreased range of motion  Visit Diagnosis: 1. Chronic bilateral low back pain with sciatica, sciatica laterality unspecified   2. Other muscle spasm   3. Other abnormalities of gait and mobility      PHYSICAL THERAPY DISCHARGE SUMMARY  Visits from Start of Care: 1  Current functional level related to goals / functional outcomes: Only came for initial visit   Remaining deficits: Unknown    Education / Equipment:   Plan: Patient agrees to discharge.  Patient goals were not met. Patient is being discharged due to not returning since the last visit.  ?????       Problem List Patient Active Problem List   Diagnosis Date Noted  . SLE [place] erythematosus) [place]   . Primary osteoarthritis of both hands 10/01/2017  . DDD (degenerative disc disease), cervical 08/23/2017  . DDD (degenerative disc disease), lumbar 08/23/2017  .  Essential hypertension 01/28/2016  . Incontinence of urine 12/26/2010  . Depression   . GERD (gastroesophageal  reflux disease)   . Fatty liver disease, nonalcoholic   . HNP (herniated nucleus pulposus), cervical   . Recurrent genital HSV (herpes simplex virus) infection   . Plantar fasciitis   . Elevated liver enzymes 12/12/2010  . Vitamin D deficiency 12/12/2010    Carney Living PT DPT  01/13/2019, 1:19 PM  [facility] [address] [place], [place], [address] Phone: [phone]   Fax:  [phone]  Name: Terri Hurley MRN: [id number] Date of Birth: 1960/09/04

## 2019-01-20 ENCOUNTER — Ambulatory Visit: Payer: BC Managed Care – PPO | Admitting: Physical Therapy

## 2019-01-27 ENCOUNTER — Ambulatory Visit: Payer: BC Managed Care – PPO | Admitting: Physical Therapy

## 2019-01-27 DIAGNOSIS — H2513 Age-related nuclear cataract, bilateral: Secondary | ICD-10-CM | POA: Diagnosis not present

## 2019-01-27 DIAGNOSIS — M3210 Systemic lupus erythematosus, organ or system involvement unspecified: Secondary | ICD-10-CM | POA: Diagnosis not present

## 2019-01-27 DIAGNOSIS — Z79899 Other long term (current) drug therapy: Secondary | ICD-10-CM | POA: Diagnosis not present

## 2019-01-27 DIAGNOSIS — H02831 Dermatochalasis of right upper eyelid: Secondary | ICD-10-CM | POA: Diagnosis not present

## 2019-01-27 DIAGNOSIS — H40033 Anatomical narrow angle, bilateral: Secondary | ICD-10-CM | POA: Diagnosis not present

## 2019-02-07 DIAGNOSIS — F4323 Adjustment disorder with mixed anxiety and depressed mood: Secondary | ICD-10-CM | POA: Diagnosis not present

## 2019-02-09 ENCOUNTER — Ambulatory Visit: Payer: BC Managed Care – PPO | Admitting: Physical Therapy
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
## 2019-02-10 ENCOUNTER — Ambulatory Visit
Admission: EM | Admit: 2019-02-10 | Discharge: 2019-02-10 | Disposition: A | Discharge status: Home / Self Care | Payer: BC Managed Care – PPO | Attending: Physician Assistant | Admitting: Physician Assistant

## 2019-02-10 ENCOUNTER — Encounter: Payer: Self-pay | Admitting: Emergency Medicine

## 2019-02-10 ENCOUNTER — Other Ambulatory Visit: Payer: Self-pay

## 2019-02-10 ENCOUNTER — Ambulatory Visit: Payer: BC Managed Care – PPO | Admitting: Orthopaedic Surgery

## 2019-02-10 DIAGNOSIS — B9689 Other specified bacterial agents as the cause of diseases classified elsewhere: Secondary | ICD-10-CM | POA: Diagnosis not present

## 2019-02-10 DIAGNOSIS — N3090 Cystitis, unspecified without hematuria: Secondary | ICD-10-CM | POA: Diagnosis not present

## 2019-02-10 LAB — POCT URINALYSIS DIP (MANUAL ENTRY)
Bilirubin, UA: NEGATIVE
Glucose, UA: 100 mg/dL — AB
Ketones, POC UA: NEGATIVE mg/dL
Nitrite, UA: POSITIVE — AB
Protein Ur, POC: 30 mg/dL — AB
Spec Grav, UA: 1.025 (ref 1.010–1.025)
Urobilinogen, UA: 2 E.U./dL — AB
pH, UA: 6.5 (ref 5.0–8.0)

## 2019-02-10 MED ORDER — SULFAMETHOXAZOLE-TRIMETHOPRIM 800-160 MG PO TABS: 1.0000 | ORAL_TABLET | Freq: Two times a day (BID) | ORAL | 0 refills | Status: AC

## 2019-02-10 NOTE — Discharge Instructions (Signed)
Your urine was positive for an urinary tract infection. Start bactrim as directed. Keep hydrated, urine should be clear to pale yellow in color. Monitor for any worsening of symptoms, fever, worsening abdominal pain, nausea/vomiting, flank pain, follow up for reevaluation.   If symptoms not improving with bactrim in the next 48 hours, can call to switch to cipro. Also sent urine for urine culture as well.

## 2019-02-10 NOTE — ED Provider Notes (Signed)
[facility]    CSN: [id number] Arrival date & time: 02/10/19  1016      History   Chief Complaint Chief Complaint  Patient presents with  . Dysuria    HPI Terri Hurley is a 58 y.o. female.   58 year old female comes in for 4-day history of urinary symptoms.  She has had urinary urgency, frequency, decreased flow, dysuria.  States she has stomach upset, nausea at baseline without worsening.  She has back pain at baseline without worsening.  Denies fever, chills, body aches.  Denies vaginal discharge, itching.  History of hysterectomy.  She has been taking AZO without relief.  Has tried hydroxyzine without relief.     Past Medical History:  Diagnosis Date  . Arthritis    low back, hip, hands  . Depression    Sees [name], NP @ [facility]  counseling center.Marland Kitchen Has h/o hospitalization  . Fatty liver disease, nonalcoholic    clinical diagnosis by endocrinologist  . Fibromyalgia   . GERD (gastroesophageal reflux disease)    controlled with PPI therapy  . Hepatitis unknown type   . High cholesterol    No medical therapy. Last lab March '12  LDL 91, T. Chol 170. Minimal elevation in '08  . HNP (herniated nucleus pulposus), cervical    C6-7. Has had PT, no surgery  . Plantar fasciitis   . Recurrent genital HSV (herpes simplex virus) infection    on daily suppression with acyclovir  . SLE (systemic lupus erythematosus) (HCC)    sees rheum, on plaquenil  . Urinary incontinence   . Vitamin D deficiency    lab '09  Vit D = 19    Patient Active Problem List   Diagnosis Date Noted  . SLE (systemic lupus erythematosus) ([place])   . Primary osteoarthritis of both hands 10/01/2017  . DDD (degenerative disc disease), cervical 08/23/2017  . DDD (degenerative disc disease), lumbar 08/23/2017  . Essential hypertension 01/28/2016  . Incontinence of urine 12/26/2010  . Depression   . GERD (gastroesophageal reflux disease)   . Fatty liver disease, nonalcoholic    . HNP (herniated nucleus pulposus), cervical   . Recurrent genital HSV (herpes simplex virus) infection   . Plantar fasciitis   . Elevated liver enzymes 12/12/2010  . Vitamin D deficiency 12/12/2010    Past Surgical History:  Procedure Laterality Date  . CHOLECYSTECTOMY    . INCONTINENCE SURGERY  2009  . SPINE SURGERY  05/2014   Cervical spine surgery C4-5, C5-6.  [place].  Marland Kitchen TOTAL ABDOMINAL HYSTERECTOMY W/ BILATERAL SALPINGOOPHORECTOMY  2009   with repair of cystocele and rectocele     OB History   No obstetric history on file.      Home Medications    Prior to Admission medications   Medication Sig Start Date End Date Taking? Authorizing Provider  acyclovir (ZOVIRAX) 400 MG tablet Take 1 tablet (400 mg total) by mouth daily. 03/11/18   Rutherford Guys, MD  aspirin EC 81 MG tablet Take 81 mg by mouth daily.    [provider]  buPROPion (WELLBUTRIN XL) 150 MG 24 hr tablet Take 1 tablet (150 mg total) by mouth daily. 03/11/18   Rutherford Guys, MD  diazepam (VALIUM) 5 MG tablet Take 0.5-1 tablets (2.5-5 mg total) by mouth daily as needed for anxiety. 03/26/18   Rutherford Guys, MD  escitalopram (LEXAPRO) 10 MG tablet Take 1 tablet (10 mg total) by mouth daily. 03/11/18   Rutherford Guys,  MD  fluticasone (FLONASE) 50 MCG/ACT nasal spray Place 2 sprays into both nostrils as needed. 03/11/18   Rutherford Guys, MD  gabapentin (NEURONTIN) 300 MG capsule Take 1-2 capsules (300-600 mg total) by mouth at bedtime. 03/11/18   Rutherford Guys, MD  hydroxychloroquine (PLAQUENIL) 200 MG tablet TAKE ONE TABLET BY MOUTH TWO TIMES A DAY 12/26/18   Bo Merino, MD  hydrOXYzine (ATARAX/VISTARIL) 25 MG tablet Take 25-50 mg by mouth at bedtime as needed. 06/09/18   [provider]  losartan-hydrochlorothiazide (HYZAAR) 50-12.5 MG tablet Take 1 tablet by mouth daily. 03/11/18   Rutherford Guys, MD  nystatin cream (MYCOSTATIN) as needed. 05/09/18   [provider]   omeprazole (PRILOSEC) 20 MG capsule Take 1 capsule (20 mg total) by mouth daily. 03/11/18   Rutherford Guys, MD  ondansetron (ZOFRAN) 4 MG tablet Take 1 tablet (4 mg total) by mouth every 8 (eight) hours as needed for nausea or vomiting. 07/08/18   Bo Merino, MD  senna-docusate (SENOKOT-S) 8.6-50 MG tablet Take 2 tablets by mouth 2 (two) times daily. 03/11/18   Rutherford Guys, MD  sulfamethoxazole-trimethoprim (BACTRIM DS) 800-160 MG tablet Take 1 tablet by mouth 2 (two) times daily for 5 days. 02/10/19 02/15/19  Tasia Catchings, Ikia Cincotta V, PA-C  Vitamin D, Ergocalciferol, (DRISDOL) 1.25 MG (50000 UT) CAPS capsule Take 1 capsule (50,000 Units total) by mouth every 7 (seven) days. 12/28/18   Bo Merino, MD    Family History Family History  Problem Relation Age of Onset  . Heart disease Mother 53       AMI  . Diabetes Mother        peripheral neuropathy  . Hypertension Mother   . Hyperlipidemia Mother   . Mental illness Mother        depression and anxiety  . Gout Mother   . Diabetes Father   . Heart disease Father   . Hyperlipidemia Father   . Mental illness Father        depression and anxiety  . Heart disease Brother        arrythmia, DCC  . Hyperlipidemia Brother   . Mental illness Brother   . Cancer Sister 33       breast cancer  . Mental illness Sister   . Asthma Sister   . Thyroid disease Sister   . COPD Sister   . Mental illness Sister   . COPD Brother   . Stroke Brother   . Hyperlipidemia Brother   . Hypertension Brother   . Mental illness Brother   . Mental illness Sister   . Mental illness Brother   . Mental illness Brother   . GER disease Son     Social History Social History   Tobacco Use  . Smoking status: Never Smoker  . Smokeless tobacco: Never Used  Substance Use Topics  . Alcohol use: No  . Drug use: No     Allergies   Robaxin [methocarbamol] and Penicillins   Review of Systems Review of Systems  Reason unable to perform ROS: See HPI as  above.     Physical Exam Triage Vital Signs ED Triage Vitals  Enc Vitals Group     BP 02/10/19 1028 136/85     Pulse Rate 02/10/19 1028 80     Resp 02/10/19 1028 16     Temp 02/10/19 1028 97.9 F (36.6 C)     Temp Source 02/10/19 1028 Oral     SpO2 02/10/19 1028  98 %     Weight --      Height --      Head Circumference --      Peak Flow --      Pain Score 02/10/19 1030 8     Pain Loc --      Pain Edu? --      Excl. in [place]? --    No data found.  Updated Vital Signs BP 136/85 (BP Location: Right Arm)   Pulse 80   Temp 97.9 F (36.6 C) (Oral)   Resp 16   SpO2 98%   Physical Exam Constitutional:      General: She is not in acute distress.    Appearance: She is well-developed. She is not ill-appearing, toxic-appearing or diaphoretic.  HENT:     Head: Normocephalic and atraumatic.  Eyes:     Conjunctiva/sclera: Conjunctivae normal.     Pupils: Pupils are equal, round, and reactive to light.  Cardiovascular:     Rate and Rhythm: Normal rate and regular rhythm.     Heart sounds: Normal heart sounds. No murmur. No friction rub. No gallop.   Pulmonary:     Effort: Pulmonary effort is normal.     Breath sounds: Normal breath sounds. No wheezing or rales.  Abdominal:     General: Bowel sounds are normal.     Palpations: Abdomen is soft.     Tenderness: There is no abdominal tenderness. There is no right CVA tenderness, left CVA tenderness, guarding or rebound.  Skin:    General: Skin is warm and dry.  Neurological:     Mental Status: She is alert and oriented to person, place, and time.  Psychiatric:        Behavior: Behavior normal.        Judgment: Judgment normal.      UC Treatments / Results  Labs (all labs ordered are listed, but only abnormal results are displayed) Labs Reviewed  POCT URINALYSIS DIP (MANUAL ENTRY) - Abnormal; Notable for the following components:      Result Value   Color, UA orange (*)    Glucose, UA =100 (*)    Blood, UA trace-intact  (*)    Protein Ur, POC =30 (*)    Urobilinogen, UA 2.0 (*)    Nitrite, UA Positive (*)    Leukocytes, UA Large (3+) (*)    All other components within normal limits  URINE CULTURE    EKG   Radiology No results found.  Procedures Procedures (including critical care time)  Medications Ordered in UC Medications - No data to display  Initial Impression / Assessment and Plan / UC Course  I have reviewed the triage vital signs and the nursing notes.  Pertinent labs & imaging results that were available during my care of the patient were reviewed by me and considered in my medical decision making (see chart for details).    Discussed with patient urine dipstick results may not be accurate due to AZO use.  Will treat empirically for cystitis with antibiotics, and also send urine for culture.  Patient initially requesting Cipro for cystitis.  Long discussion of limiting Cipro use due to antibiotic resistance.  Patient with multiple cultures in the past, with resistance to Macrobid.  She was able to tolerate Bactrim in the past with good relief, urine culture also shows susceptibility.  Patient willing to try Bactrim at this time, and will call back to switch to Cipro if symptoms not improving.  Push  fluids.  Return precautions given.  Okay to switch to ciprofloxacin if symptoms not improving with Bactrim in 48 hours.  Final Clinical Impressions(s) / UC Diagnoses   Final diagnoses:  Cystitis    ED Prescriptions    Medication Sig Dispense Auth. Provider   sulfamethoxazole-trimethoprim (BACTRIM DS) 800-160 MG tablet Take 1 tablet by mouth 2 (two) times daily for 5 days. 10 tablet Tobin Chad, [place] 02/10/19 1115

## 2019-02-10 NOTE — ED Notes (Signed)
Patient able to ambulate independently  

## 2019-02-10 NOTE — ED Triage Notes (Signed)
Patient presents to [facility] for assessment of urinary urgency, decreased flow, and burning with urination x 4 days.  States she has taken "a lot" of AZO, and has also tried hydroxyzine for "bladder spasms" without relief.

## 2019-02-12 ENCOUNTER — Telehealth (HOSPITAL_COMMUNITY): Payer: Self-pay | Admitting: Physician Assistant

## 2019-02-12 MED ORDER — CIPROFLOXACIN HCL 500 MG PO TABS: 500.0000 mg | ORAL_TABLET | Freq: Two times a day (BID) | ORAL | 0 refills | Status: DC

## 2019-02-12 NOTE — Telephone Encounter (Signed)
58 year old female calls to inform bactrim not helping her urinary symptoms. At the time of visit 02/10/2019, she had positive dipstick but had taken AZO prior to arrival.  Since visit, she has been staying hydrated, taking Bactrim as directed without any relief of symptoms.  She has worsening of her frequency and continues to have trouble urinating.  She denies any new back pain/flank pain, nausea, vomiting.  Denies fever, chills, body aches.  Discussed urine culture results with patient, 40,000 colonies, which does not qualify for urinary tract infection.  However, given patient with worsening symptoms, will provide Cipro for treatment and have patient continue to push fluids.  Discussed possible kidney stones causing symptoms.  Discussed with patient, if symptoms not improving, will need to follow-up with urology for further evaluation needed.  Return precautions discussed.  Patient expresses understanding and agrees to plan.

## 2019-02-13 ENCOUNTER — Encounter: Payer: Self-pay | Admitting: Physician Assistant

## 2019-02-13 ENCOUNTER — Other Ambulatory Visit: Payer: Self-pay

## 2019-02-13 ENCOUNTER — Ambulatory Visit: Payer: BC Managed Care – PPO | Admitting: Rheumatology

## 2019-02-13 VITALS — BP 126/79 | HR 91 | Resp 15 | Ht 61.5 in | Wt 208.6 lb

## 2019-02-13 DIAGNOSIS — M3219 Other organ or system involvement in systemic lupus erythematosus: Secondary | ICD-10-CM | POA: Diagnosis not present

## 2019-02-13 DIAGNOSIS — K760 Fatty (change of) liver, not elsewhere classified: Secondary | ICD-10-CM

## 2019-02-13 DIAGNOSIS — M19041 Primary osteoarthritis, right hand: Secondary | ICD-10-CM

## 2019-02-13 DIAGNOSIS — K219 Gastro-esophageal reflux disease without esophagitis: Secondary | ICD-10-CM

## 2019-02-13 DIAGNOSIS — I10 Essential (primary) hypertension: Secondary | ICD-10-CM

## 2019-02-13 DIAGNOSIS — F5101 Primary insomnia: Secondary | ICD-10-CM | POA: Diagnosis not present

## 2019-02-13 DIAGNOSIS — Z79899 Other long term (current) drug therapy: Secondary | ICD-10-CM

## 2019-02-13 DIAGNOSIS — M5136 Other intervertebral disc degeneration, lumbar region: Secondary | ICD-10-CM

## 2019-02-13 DIAGNOSIS — M5030 Other cervical disc degeneration, unspecified cervical region: Secondary | ICD-10-CM

## 2019-02-13 DIAGNOSIS — Z8269 Family history of other diseases of the musculoskeletal system and connective tissue: Secondary | ICD-10-CM

## 2019-02-13 DIAGNOSIS — R5383 Other fatigue: Secondary | ICD-10-CM

## 2019-02-13 DIAGNOSIS — M797 Fibromyalgia: Secondary | ICD-10-CM

## 2019-02-13 DIAGNOSIS — M19042 Primary osteoarthritis, left hand: Secondary | ICD-10-CM

## 2019-02-13 DIAGNOSIS — E559 Vitamin D deficiency, unspecified: Secondary | ICD-10-CM

## 2019-02-13 MED ORDER — HYDROXYCHLOROQUINE SULFATE 200 MG PO TABS: ORAL_TABLET | ORAL | 0 refills | Status: DC

## 2019-02-14 LAB — URINE CULTURE: Culture: 40000 — AB

## 2019-02-15 ENCOUNTER — Telehealth (HOSPITAL_COMMUNITY): Payer: Self-pay | Admitting: Emergency Medicine

## 2019-02-15 NOTE — Telephone Encounter (Signed)
Given cipro by Amy PA, attempted ot see how she was feeling, no answer, voicemail left.

## 2019-02-28 DIAGNOSIS — F410 Panic disorder [episodic paroxysmal anxiety] without agoraphobia: Secondary | ICD-10-CM | POA: Diagnosis not present

## 2019-02-28 DIAGNOSIS — Z87440 Personal history of urinary (tract) infections: Secondary | ICD-10-CM | POA: Diagnosis not present

## 2019-02-28 DIAGNOSIS — Z23 Encounter for immunization: Secondary | ICD-10-CM | POA: Diagnosis not present

## 2019-02-28 DIAGNOSIS — M797 Fibromyalgia: Secondary | ICD-10-CM | POA: Diagnosis not present

## 2019-02-28 DIAGNOSIS — G2581 Restless legs syndrome: Secondary | ICD-10-CM | POA: Diagnosis not present

## 2019-03-07 ENCOUNTER — Telehealth: Payer: Self-pay | Admitting: Orthopaedic Surgery

## 2019-03-07 DIAGNOSIS — M545 Low back pain, unspecified: Secondary | ICD-10-CM

## 2019-03-07 DIAGNOSIS — G8929 Other chronic pain: Secondary | ICD-10-CM

## 2019-03-07 DIAGNOSIS — F4323 Adjustment disorder with mixed anxiety and depressed mood: Secondary | ICD-10-CM | POA: Diagnosis not present

## 2019-03-07 NOTE — Telephone Encounter (Signed)
Please advise. Would you like for me to try and bring patient in for ROV?

## 2019-03-07 NOTE — Telephone Encounter (Signed)
Patient called stating that she finished her PT and now would like to proceed with getting her MRI.  CB#[phone].  Thank you.

## 2019-03-07 NOTE — Telephone Encounter (Signed)
Proceed with MRI thanks

## 2019-03-08 NOTE — Telephone Encounter (Signed)
Order entered. Patient advised.  

## 2019-03-21 DIAGNOSIS — F4323 Adjustment disorder with mixed anxiety and depressed mood: Secondary | ICD-10-CM | POA: Diagnosis not present

## 2019-03-23 ENCOUNTER — Encounter: Payer: Self-pay | Admitting: Physical Medicine and Rehabilitation

## 2019-03-23 ENCOUNTER — Other Ambulatory Visit: Payer: Self-pay

## 2019-03-23 ENCOUNTER — Encounter
Payer: BC Managed Care – PPO | Attending: Physical Medicine and Rehabilitation | Admitting: Physical Medicine and Rehabilitation

## 2019-03-23 VITALS — BP 130/84 | HR 79 | Temp 97.7°F | Ht 61.5 in | Wt 206.0 lb

## 2019-03-23 DIAGNOSIS — M797 Fibromyalgia: Secondary | ICD-10-CM | POA: Diagnosis not present

## 2019-03-23 DIAGNOSIS — R208 Other disturbances of skin sensation: Secondary | ICD-10-CM | POA: Diagnosis not present

## 2019-03-23 DIAGNOSIS — M329 Systemic lupus erythematosus, unspecified: Secondary | ICD-10-CM

## 2019-03-23 DIAGNOSIS — M7918 Myalgia, other site: Secondary | ICD-10-CM

## 2019-03-23 MED ORDER — DULOXETINE HCL 30 MG PO CPEP: 30.0000 mg | ORAL_CAPSULE | Freq: Every day | ORAL | 2 refills | Status: DC

## 2019-03-23 MED ORDER — LIDOCAINE 5 % EX PTCH: 3.0000 | MEDICATED_PATCH | Freq: Two times a day (BID) | CUTANEOUS | 5 refills | Status: AC
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
## 2019-03-23 NOTE — Patient Instructions (Signed)
Pt is a 58 yr old with severe depression, Lupus, fibromyalgia, and myofascial pain syndrome,  1. Previous piriformis syndrome-  To prevent from occurring again. (butt pain)  Sit on tennis ball (put pillow down first) 10-15 minutes to sit on it.  1. Dr Sharion Balloon- The survivor's Handbook to Fibromyalgia and Myofascial Pain Syndrome 2. Theracane- 2-4 minutes on each trigger point- hold firm pressure, don't massage; can get for $20-30 online-or medical supply store-  will never need replacing-no more than 30 minutes/day. 3.   Tennis balls- 2-5 minutes on each trigger point- buttocks, back of  thighs, calves, low and mid back- can throw in dryer x1 to make  softer. 3. Magnesium [phone] mg 2-3x/day for muscle tightness- titrate up until loose stools 4. Lidocaine patches- 2-3 patches 12 hrs on;12 hrs off- areas of most pain- never on bottom of feet 5. Rolling pin- can use on calves, thighs and arms, and buttocks- roll slowly over muscles firmly- roll towards heart 6. If hold pressure between 1st and 2nd toes hold pressure between them for 2-4 minutes.  7.  Will add Duloxetine/Cymbalta 30 mg daily x 1 week then 60 mg daily- for fibromyalgia- Can continue Lexapro at 10 mg daily and Wellbutrin since not same class, however DON'T increase Lexapro.   8. Dr Mannie Stabile writes for Valium- feel this is appropriate since has tried 3-4 other muscle relaxants- without improvement.  9. F/U in 1-2 weeks for trigger points

## 2019-03-29 ENCOUNTER — Other Ambulatory Visit: Payer: Self-pay

## 2019-03-29 ENCOUNTER — Ambulatory Visit
Admission: RE | Admit: 2019-03-29 | Discharge: 2019-03-29 | Disposition: A | Discharge status: Home / Self Care | Payer: BC Managed Care – PPO | Source: Ambulatory Visit | Attending: Orthopaedic Surgery | Admitting: Orthopaedic Surgery

## 2019-03-29 DIAGNOSIS — M5136 Other intervertebral disc degeneration, lumbar region: Secondary | ICD-10-CM | POA: Diagnosis not present

## 2019-03-29 DIAGNOSIS — M545 Low back pain, unspecified: Secondary | ICD-10-CM

## 2019-03-29 DIAGNOSIS — G8929 Other chronic pain: Secondary | ICD-10-CM

## 2019-03-31 ENCOUNTER — Telehealth: Payer: Self-pay | Admitting: Orthopaedic Surgery

## 2019-03-31 ENCOUNTER — Telehealth: Payer: Self-pay | Admitting: Radiology

## 2019-03-31 ENCOUNTER — Telehealth: Payer: Self-pay | Admitting: *Deleted

## 2019-03-31 DIAGNOSIS — M545 Low back pain, unspecified: Secondary | ICD-10-CM

## 2019-03-31 DIAGNOSIS — G8929 Other chronic pain: Secondary | ICD-10-CM

## 2019-03-31 NOTE — Telephone Encounter (Signed)
Patient called. Says she missed a call from [place]. Her call back number is [phone]

## 2019-03-31 NOTE — Telephone Encounter (Signed)
Prior authorization  Denied. Diagnosis of post-herpetic neuralgia or diabetic neuropathy is required.

## 2019-03-31 NOTE — Telephone Encounter (Signed)
I left voicemail for patient requesting return call. Dr. Lorin Mercy has reviewed MRI and would like to know if patient would like to be scheduled for Lumbar ESI and follow up with him afterwards. Will wait for return call to discuss.

## 2019-04-03 ENCOUNTER — Telehealth: Payer: Self-pay | Admitting: Orthopaedic Surgery

## 2019-04-03 ENCOUNTER — Encounter: Payer: BC Managed Care – PPO | Admitting: Physical Medicine and Rehabilitation

## 2019-04-03 NOTE — Telephone Encounter (Signed)
Patient returned a call. Her call back number is [phone]

## 2019-04-04 NOTE — Telephone Encounter (Signed)
FYI Patient requests to proceed with Lumbar ESI (referral entered), but would like to keep her follow up appt tomorrow with Jeneen Rinks to actually review the MRI.

## 2019-04-04 NOTE — Telephone Encounter (Signed)
I returned call to patient. See duplicate message.

## 2019-04-04 NOTE — Addendum Note (Signed)
Addended by: Meyer Cory on: 04/04/2019 12:42 PM   Modules accepted: Orders

## 2019-04-05 ENCOUNTER — Ambulatory Visit (INDEPENDENT_AMBULATORY_CARE_PROVIDER_SITE_OTHER): Payer: BC Managed Care – PPO | Admitting: Surgery

## 2019-04-05 ENCOUNTER — Other Ambulatory Visit: Payer: Self-pay

## 2019-04-05 ENCOUNTER — Encounter: Payer: Self-pay | Admitting: Surgery

## 2019-04-05 DIAGNOSIS — M7061 Trochanteric bursitis, right hip: Secondary | ICD-10-CM

## 2019-04-05 DIAGNOSIS — M5126 Other intervertebral disc displacement, lumbar region: Secondary | ICD-10-CM | POA: Diagnosis not present

## 2019-04-05 NOTE — Progress Notes (Signed)
Office Visit Note   Patient: Terri Hurley           Date of Birth: [birth year]           MRN: [id number] Visit Date: 04/05/2019              Requested by: [name], MD [address] [place],  [place] [id number] PCP: [name], MD   Assessment & Plan: Visit Diagnoses:  1. HNP (herniated nucleus pulposus), lumbar   2. Greater trochanteric bursitis of right hip     Plan: We will schedule appointment with Dr. Ernestina Patches for consult to discuss appropriate lumbar ESI.  Follow with Dr. Lorin Mercy in 4 weeks for recheck.  I did advise patient to pay close attention to how she feels after the injection.  She also has pain in the right lateral hip secondary to greater trochanteric bursitis.  We may consider injection there depend upon how she does.  By history and exam she is not symptomatic enough at this point for me to have Dr. Louanne Skye review her study for possible L5-S1 microdiscectomy.  MRI was reviewed with patient today.  Follow-Up Instructions: Return in about 4 weeks (around 05/03/2019) for With Dr. Lorin Mercy after lumbar ESI and again to review MRI.   Orders:  Orders Placed This Encounter  Procedures  . Ambulatory referral to Physical Medicine Rehab   No orders of the defined types were placed in this encounter.     Procedures: No procedures performed   Clinical Data: No additional findings.   Subjective: Chief Complaint  Patient presents with  . Lower Back - Follow-up    HPI 58 year old white female returns for review of lumbar MRI scan that was performed March 29, 2019.  Scan showed:  EXAM: MRI LUMBAR SPINE WITHOUT CONTRAST  TECHNIQUE: Multiplanar, multisequence MR imaging of the lumbar spine was performed. No intravenous contrast was administered.  COMPARISON:  Two views lumbar spine 01/10/2019. MRI lumbar spine 11/05/2011.  FINDINGS: Segmentation:  Standard.  Alignment: No listhesis. Convex right scoliosis with the apex at approximately L3-4.   Vertebrae:  No fracture, evidence of discitis, or bone lesion.  Conus medullaris and cauda equina: Conus extends to the L1 level. Conus and cauda equina appear normal.  Paraspinal and other soft tissues: Negative.  Disc levels:  T11-12: Negative.  T12-L1: Negative.  L1-2: Shallow disc bulge without stenosis.  L2-3: Shallow disc bulge without stenosis.  L3-4: Shallow disc bulge without stenosis.  L4-5: Shallow disc bulge and mild facet degenerative disease. No stenosis.  L5-S1: The patient has a new central disc protrusion indenting the ventral thecal sac. A sequestered disc fragment measuring 0.9 cm craniocaudal x 0.7 cm AP x 0.4 cm transverse is seen in the left subarticular recess inferior to the disc interspace. The fragment contacts the left S1 root without compression or displacement. The foramina are widely patent.  IMPRESSION: New central disc protrusion at L5-S1 indents the ventral thecal sac but the central canal appears open. There is also a new sequestered disc fragment inferior to the L5-S1 disc interspace in the left subarticular recess. The fragment contacts the left S1 root without compression or displacement.  Mild disc bulging L1-2 to L4-5 without stenosis.   Patient is complaining of pain in the lower back extending into the bilateral buttocks.  Nothing radiating down the left leg.  She does have some pain in the right lateral hip.  Back pain when she is sitting twisting.  It has improved some since  her last visit with Dr. Lorin Mercy.     Review of Systems No current cardiopulmonary GI GU issues  Objective: Vital Signs: There were no vitals taken for this visit.  Physical Exam HENT:     Head: Normocephalic.  Eyes:     Extraocular Movements: Extraocular movements intact.     Pupils: Pupils are equal, round, and reactive to light.  Musculoskeletal:     Comments: Gait is normal.  Negative logroll bilateral hips.  Negative straight leg  raise.  She has mild to moderate tenderness over the right hip greater trochanter bursa and this extends midway down the IT band.  Bilateral calves nontender.  Neurovascular intact.  No focal motor deficits.  Neurological:     General: No focal deficit present.     Mental Status: She is alert and oriented to person, place, and time.  Psychiatric:        Mood and Affect: Mood normal.     Ortho Exam  Specialty Comments:  No specialty comments available.  Imaging: No results found.   PMFS History: Patient Active Problem List   Diagnosis Date Noted  . Allodynia 03/23/2019  . Fibromyalgia 03/23/2019  . Myofascial pain dysfunction syndrome 03/23/2019  . SLE (systemic lupus erythematosus) ([place])   . Primary osteoarthritis of both hands 10/01/2017  . DDD (degenerative disc disease), cervical 08/23/2017  . DDD (degenerative disc disease), lumbar 08/23/2017  . Essential hypertension 01/28/2016  . Incontinence of urine 12/26/2010  . Depression   . GERD (gastroesophageal reflux disease)   . Fatty liver disease, nonalcoholic   . HNP (herniated nucleus pulposus), cervical   . Recurrent genital HSV (herpes simplex virus) infection   . Plantar fasciitis   . Elevated liver enzymes 12/12/2010  . Vitamin D deficiency 12/12/2010   Past Medical History:  Diagnosis Date  . Arthritis    low back, hip, hands  . Depression    Sees [name], NP @ [facility]  counseling center.Marland Kitchen Has h/o hospitalization  . Fatty liver disease, nonalcoholic    clinical diagnosis by endocrinologist  . Fibromyalgia   . GERD (gastroesophageal reflux disease)    controlled with PPI therapy  . Hepatitis unknown type   . High cholesterol    No medical therapy. Last lab March '12  LDL 91, T. Chol 170. Minimal elevation in '08  . HNP (herniated nucleus pulposus), cervical    C6-7. Has had PT, no surgery  . Plantar fasciitis   . Recurrent genital HSV (herpes simplex virus) infection    on daily suppression  with acyclovir  . SLE (systemic lupus erythematosus) ([facility])    sees rheum, on plaquenil  . Urinary incontinence   . Vitamin D deficiency    lab '09  Vit D = 19    Family History  Problem Relation Age of Onset  . Heart disease Mother 3       AMI  . Diabetes Mother        peripheral neuropathy  . Hypertension Mother   . Hyperlipidemia Mother   . Mental illness Mother        depression and anxiety  . Gout Mother   . Diabetes Father   . Heart disease Father   . Hyperlipidemia Father   . Mental illness Father        depression and anxiety  . Heart disease Brother        arrythmia, DCC  . Hyperlipidemia Brother   . Mental illness Brother   . Cancer Sister  55       breast cancer  . Mental illness Sister   . Asthma Sister   . Thyroid disease Sister   . COPD Sister   . Mental illness Sister   . COPD Brother   . Stroke Brother   . Hyperlipidemia Brother   . Hypertension Brother   . Mental illness Brother   . Mental illness Sister   . Mental illness Brother   . Mental illness Brother   . GER disease Son     Past Surgical History:  Procedure Laterality Date  . CHOLECYSTECTOMY    . INCONTINENCE SURGERY  2009  . SPINE SURGERY  05/2014   Cervical spine surgery C4-5, C5-6.  [place].  Marland Kitchen TOTAL ABDOMINAL HYSTERECTOMY W/ BILATERAL SALPINGOOPHORECTOMY  2009   with repair of cystocele and rectocele    Social History   Occupational History  . Occupation: HAIR DRESSER  Tobacco Use  . Smoking status: Never Smoker  . Smokeless tobacco: Never Used  Substance and Sexual Activity  . Alcohol use: No  . Drug use: No  . Sexual activity: Never    Birth control/protection: Surgical, Post-menopausal

## 2019-04-07 ENCOUNTER — Ambulatory Visit: Payer: BLUE CROSS/BLUE SHIELD | Admitting: Rheumatology

## 2019-04-14 ENCOUNTER — Telehealth: Payer: Self-pay | Admitting: Rheumatology

## 2019-04-14 NOTE — Telephone Encounter (Signed)
Patient advised she is to have labs in December.

## 2019-04-14 NOTE — Telephone Encounter (Signed)
Patient called requesting a return call to let her know when she is due for labwork.   

## 2019-04-18 DIAGNOSIS — F4323 Adjustment disorder with mixed anxiety and depressed mood: Secondary | ICD-10-CM | POA: Diagnosis not present

## 2019-04-26 ENCOUNTER — Other Ambulatory Visit: Payer: Self-pay | Admitting: Rheumatology

## 2019-04-26 ENCOUNTER — Other Ambulatory Visit: Payer: Self-pay

## 2019-04-26 ENCOUNTER — Encounter
Payer: BC Managed Care – PPO | Attending: Physical Medicine and Rehabilitation | Admitting: Physical Medicine and Rehabilitation

## 2019-04-26 ENCOUNTER — Encounter: Payer: Self-pay | Admitting: Physical Medicine and Rehabilitation

## 2019-04-26 DIAGNOSIS — M797 Fibromyalgia: Secondary | ICD-10-CM

## 2019-04-26 DIAGNOSIS — R208 Other disturbances of skin sensation: Secondary | ICD-10-CM

## 2019-04-26 DIAGNOSIS — M329 Systemic lupus erythematosus, unspecified: Secondary | ICD-10-CM | POA: Insufficient documentation

## 2019-04-26 DIAGNOSIS — M7918 Myalgia, other site: Secondary | ICD-10-CM | POA: Diagnosis not present

## 2019-04-26 DIAGNOSIS — M3219 Other organ or system involvement in systemic lupus erythematosus: Secondary | ICD-10-CM

## 2019-04-26 MED ORDER — PREGABALIN 50 MG PO CAPS: 50.0000 mg | ORAL_CAPSULE | Freq: Two times a day (BID) | ORAL | 2 refills | Status: DC

## 2019-04-26 NOTE — Progress Notes (Signed)
   Got theracane and got book- sister bought it for her.   Started Duloxetine- kept HA the whole time and not pooping and not peeing- no improvement in Sx's- actually fell off commode because straining so hard. Stopped  After 2 weeks.  After taking a few days, had better ROM and could wipe self better when went to bathroom.   Insurance didn't cover Lidocaine patches. Allergies acting up- sneezing a lot.   Was told code was a "surgical code". Trying to get new insurance- was told might apply for medicaid- trying to get disability- insurance runs out end of year.     Got Mri since last saw her- getting epidural steroid injection next week by Ortho   MRI lumbar spine 03/29/2019 IMPRESSION: New central disc protrusion at L5-S1 indents the ventral thecal sac but the central canal appears open. There is also a new sequestered disc fragment inferior to the L5-S1 disc interspace in the left subarticular recess. The fragment contacts the left S1 root without compression or displacement.  Mild disc bulging L1-2 to L4-5 without stenosis.   Patient here for trigger point injections for  Consent done and on chart.  Cleaned areas with alcohol and injected using a 27 gauge 1.5 inch needle  Injected 5cc Using 1% Lidocaine with no EPI  Upper traps B/L Levators Posterior scalenes B/L Middle scalenes Splenius Capitus Pectoralis Major Rhomboids B/L x2 Infraspinatus Teres Major/minor Thoracic paraspinals B/L x2 Lumbar paraspinals R only Other injections-    Patient's level of pain prior was  10/10 Current level of pain after injections - feels more relaxed- now 3-4/10 in spite of soreness of injection spots.  There was no bleeding or complications.  1. Trigger point injections- Patient was advised to drink a lot of water on day after injections to flush system Will have increased soreness for 12-48 hours after injections.  Can use Lidocaine patches the day AFTER  injections Can use theracane on day of injections in places didn't inject Can use heating pad 4-6 hours AFTER injections.  2. Currently- on gabapentin - couldn't tolerate Duloxetine- will try Lyrica 50 mg 2x/day x 1 week then 100 mg 2x/day- for nerve pain.  3 Getting epidural next week. They can address trochanteric bursitis.   3. F/U in 1 month-

## 2019-04-26 NOTE — Patient Instructions (Signed)
1. Trigger point injections- Patient was advised to drink a lot of water on day after injections to flush system Will have increased soreness for 12-48 hours after injections.  Can use Lidocaine patches the day AFTER injections Can use theracane on day of injections in places didn't inject Can use heating pad 4-6 hours AFTER injections.  2. Currently- on gabapentin - couldn't tolerate Duloxetine- will try Lyrica 50 mg 2x/day x 1 week then 100 mg 2x/day- for nerve pain.   3. F/U in 1 month

## 2019-04-28 DIAGNOSIS — E669 Obesity, unspecified: Secondary | ICD-10-CM | POA: Diagnosis not present

## 2019-04-28 DIAGNOSIS — M329 Systemic lupus erythematosus, unspecified: Secondary | ICD-10-CM | POA: Diagnosis not present

## 2019-04-28 DIAGNOSIS — M797 Fibromyalgia: Secondary | ICD-10-CM | POA: Diagnosis not present

## 2019-04-28 DIAGNOSIS — K219 Gastro-esophageal reflux disease without esophagitis: Secondary | ICD-10-CM | POA: Diagnosis not present

## 2019-05-01 ENCOUNTER — Encounter: Payer: Self-pay | Admitting: Physical Medicine and Rehabilitation

## 2019-05-01 ENCOUNTER — Ambulatory Visit: Payer: Self-pay

## 2019-05-01 ENCOUNTER — Ambulatory Visit (INDEPENDENT_AMBULATORY_CARE_PROVIDER_SITE_OTHER): Payer: BC Managed Care – PPO | Admitting: Physical Medicine and Rehabilitation

## 2019-05-01 ENCOUNTER — Other Ambulatory Visit: Payer: Self-pay

## 2019-05-01 VITALS — BP 119/77 | HR 79

## 2019-05-01 DIAGNOSIS — M5116 Intervertebral disc disorders with radiculopathy, lumbar region: Secondary | ICD-10-CM | POA: Diagnosis not present

## 2019-05-01 MED ORDER — BETAMETHASONE SOD PHOS & ACET 6 (3-3) MG/ML IJ SUSP
12.0000 mg | Freq: Once | INTRAMUSCULAR | Status: AC
  Administered 2019-05-01: 12 mg

## 2019-05-01 NOTE — Progress Notes (Signed)
 .  Numeric Pain Rating Scale and Functional Assessment Average Pain 9   In the last MONTH (on 0-10 scale) has pain interfered with the following?  1. General activity like being  able to carry out your everyday physical activities such as walking, climbing stairs, carrying groceries, or moving a chair?  Rating(9)   -Driver(calling a driver), -BT, -Dye Allergies.

## 2019-05-02 ENCOUNTER — Telehealth: Payer: Self-pay | Admitting: *Deleted

## 2019-05-02 DIAGNOSIS — F4323 Adjustment disorder with mixed anxiety and depressed mood: Secondary | ICD-10-CM | POA: Diagnosis not present

## 2019-05-02 NOTE — Telephone Encounter (Signed)
I thought I wrote in the chart, and I'm sorry that I didn't, but I told patient specifically would start to wean the Gabapentin once on lyrica 100 mg BID, but not before then.  Can wean Gabapentin  By 300 mg every 3 days. Will call and let Dr. Roma Kayser office know, and I will also let patient know.  L/M for pt on her individual voicemail.

## 2019-05-02 NOTE — Telephone Encounter (Signed)
Terri Hurley, Dr. Odetta Pink assistant, [facility] left a message stating that the pharmacy contacted them about patient taking gabapentin and Lyrica concurrently.  Dr. Marlana Latus office wants to know if Dr. Dagoberto Ligas intended the patient to take both medications at the same time or did she want the gabapentin D/C'd.  Please call Dr. Marlana Latus office at [phone]

## 2019-05-03 ENCOUNTER — Ambulatory Visit: Payer: BC Managed Care – PPO | Admitting: Orthopaedic Surgery

## 2019-05-09 ENCOUNTER — Telehealth: Payer: Self-pay | Admitting: *Deleted

## 2019-05-09 NOTE — Telephone Encounter (Signed)
Terri Hurley called and says that she is taking lyrica twice per day now and needs to know about weaning gabapentin.  Dr Dagoberto Ligas is out of the office but her last phone message states once she is taking lyrica(pregabalin) bid, she may wean gabapentin by 300mg  every 3 days.  I sent a letter by my chart with the same instructions, and spoke with her on the phone.  She states she is only taking 600 mg at night of the gabapentin, so she should take 300 mg for the next 3 nights and then stop. I will send to Dr Dagoberto Ligas to let us know if there are other instructions.

## 2019-05-11 NOTE — Telephone Encounter (Signed)
No other instructions- thank you- so should hopefully go well for pt.

## 2019-05-16 ENCOUNTER — Other Ambulatory Visit: Payer: Self-pay

## 2019-05-16 ENCOUNTER — Ambulatory Visit: Payer: BC Managed Care – PPO | Admitting: Orthopaedic Surgery

## 2019-05-16 ENCOUNTER — Encounter: Payer: Self-pay | Admitting: Orthopaedic Surgery

## 2019-05-16 VITALS — BP 130/79 | HR 71 | Ht 61.5 in | Wt 210.0 lb

## 2019-05-16 DIAGNOSIS — M5136 Other intervertebral disc degeneration, lumbar region: Secondary | ICD-10-CM | POA: Diagnosis not present

## 2019-05-16 DIAGNOSIS — M5126 Other intervertebral disc displacement, lumbar region: Secondary | ICD-10-CM | POA: Diagnosis not present

## 2019-05-16 NOTE — Progress Notes (Signed)
Office Visit Note   Patient: Terri Hurley           Date of Birth: May 02, 1961           MRN: [id number] Visit Date: 05/16/2019              Requested by: [name], MD [address],  [place] [address] PCP: [name], MD   Assessment & Plan: Visit Diagnoses:  1. DDD (degenerative disc disease), lumbar   2. Protrusion of lumbar intervertebral disc     Plan: Patient had some improvement with SI joint injections.  I will check her back again in 8 weeks.  If she continues to have claudication symptoms we may need to consider myelogram CT scan see if she has significant disc bulge with stenosis and upright position.  We could also consider injection to 5 1 level.  Follow-Up Instructions: Return in about 2 months (around 07/16/2019).   Orders:  No orders of the defined types were placed in this encounter.  No orders of the defined types were placed in this encounter.     Procedures: No procedures performed   Clinical Data: No additional findings.   Subjective: Chief Complaint  Patient presents with  . Lower Back - Follow-up    HPI 58 year old female with intermittent problems with back pain that has been severe for a few months.  She been on chronic antidepressant medication used to take Neurontin and is now been switched to Lyrica.  Bilateral sacroiliac injections 05/01/2019 gave her some relief.  She describes claudication symptoms has to lean over a grocery cart when she goes to the store problems standing increased problems with turning or twisting.  She gets some relief when she lays down.  When she standing for period of time or walking she gets relief if she sits.  She denies fever or chills.  She has been through therapy in the past.  She is done these exercises on her own for home exercise program.  Review of Systems renewed updated unchanged from 01/10/2019 office visit.   Objective: Vital Signs: BP 130/79 (BP Location: Left Arm, Patient  Position: Sitting)   Pulse 71   Ht 5' 1.5" (1.562 m)   Wt 210 lb (95.3 kg)   BMI 39.04 kg/m   Physical Exam Constitutional:      Appearance: She is well-developed.  HENT:     Head: Normocephalic.     Right Ear: External ear normal.     Left Ear: External ear normal.  Eyes:     Pupils: Pupils are equal, round, and reactive to light.  Neck:     Thyroid: No thyromegaly.     Trachea: No tracheal deviation.  Cardiovascular:     Rate and Rhythm: Normal rate.  Pulmonary:     Effort: Pulmonary effort is normal.  Abdominal:     Palpations: Abdomen is soft.  Skin:    General: Skin is warm and dry.  Neurological:     Mental Status: She is alert and oriented to person, place, and time.  Psychiatric:        Behavior: Behavior normal.     Ortho Exam patient has radicular symptoms with positive skin and subtendinous pinch test.  Tenderness palpation lumbosacral junction she withdraws.  Some sciatic notch tenderness right and left.  Trochanteric bursal tenderness.  Negative logroll to the hips.  Negative straight leg raising 90 degrees.  Knee and ankle jerk 1+ and symmetrical.  Specialty Comments:  No specialty comments available.  Imaging: CLINICAL DATA:  Low back and bilateral leg pain, right worse than left, for 2 months.  EXAM: MRI LUMBAR SPINE WITHOUT CONTRAST  TECHNIQUE: Multiplanar, multisequence MR imaging of the lumbar spine was performed. No intravenous contrast was administered.  COMPARISON:  Two views lumbar spine 01/10/2019. MRI lumbar spine 11/05/2011.  FINDINGS: Segmentation:  Standard.  Alignment: No listhesis. Convex right scoliosis with the apex at approximately L3-4.  Vertebrae:  No fracture, evidence of discitis, or bone lesion.  Conus medullaris and cauda equina: Conus extends to the L1 level. Conus and cauda equina appear normal.  Paraspinal and other soft tissues: Negative.  Disc levels:  T11-12: Negative.  T12-L1: Negative.   L1-2: Shallow disc bulge without stenosis.  L2-3: Shallow disc bulge without stenosis.  L3-4: Shallow disc bulge without stenosis.  L4-5: Shallow disc bulge and mild facet degenerative disease. No stenosis.  L5-S1: The patient has a new central disc protrusion indenting the ventral thecal sac. A sequestered disc fragment measuring 0.9 cm craniocaudal x 0.7 cm AP x 0.4 cm transverse is seen in the left subarticular recess inferior to the disc interspace. The fragment contacts the left S1 root without compression or displacement. The foramina are widely patent.  IMPRESSION: New central disc protrusion at L5-S1 indents the ventral thecal sac but the central canal appears open. There is also a new sequestered disc fragment inferior to the L5-S1 disc interspace in the left subarticular recess. The fragment contacts the left S1 root without compression or displacement.  Mild disc bulging L1-2 to L4-5 without stenosis.   Electronically Signed   By: Inge Rise M.D.   On: 03/29/2019 15:03   PMFS History: Patient Active Problem List   Diagnosis Date Noted  . Protrusion of lumbar intervertebral disc 05/16/2019  . Allodynia 03/23/2019  . Fibromyalgia 03/23/2019  . Myofascial pain dysfunction syndrome 03/23/2019  . SLE (systemic lupus erythematosus) ([place])   . Primary osteoarthritis of both hands 10/01/2017  . DDD (degenerative disc disease), cervical 08/23/2017  . DDD (degenerative disc disease), lumbar 08/23/2017  . Essential hypertension 01/28/2016  . Incontinence of urine 12/26/2010  . Depression   . GERD (gastroesophageal reflux disease)   . Fatty liver disease, nonalcoholic   . HNP (herniated nucleus pulposus), cervical   . Recurrent genital HSV (herpes simplex virus) infection   . Plantar fasciitis   . Elevated liver enzymes 12/12/2010  . Vitamin D deficiency 12/12/2010   Past Medical History:  Diagnosis Date  . Arthritis    low back, hip, hands  .  Depression    Sees [name], NP @ [facility]  counseling center.Marland Kitchen Has h/o hospitalization  . Fatty liver disease, nonalcoholic    clinical diagnosis by endocrinologist  . Fibromyalgia   . GERD (gastroesophageal reflux disease)    controlled with PPI therapy  . Hepatitis unknown type   . High cholesterol    No medical therapy. Last lab March '12  LDL 91, T. Chol 170. Minimal elevation in '08  . HNP (herniated nucleus pulposus), cervical    C6-7. Has had PT, no surgery  . Plantar fasciitis   . Recurrent genital HSV (herpes simplex virus) infection    on daily suppression with acyclovir  . SLE (systemic lupus erythematosus) ([facility])    sees rheum, on plaquenil  . Urinary incontinence   . Vitamin D deficiency    lab '09  Vit D = 19    Family History  Problem Relation Age of Onset  .  Heart disease Mother 32       AMI  . Diabetes Mother        peripheral neuropathy  . Hypertension Mother   . Hyperlipidemia Mother   . Mental illness Mother        depression and anxiety  . Gout Mother   . Diabetes Father   . Heart disease Father   . Hyperlipidemia Father   . Mental illness Father        depression and anxiety  . Heart disease Brother        arrythmia, DCC  . Hyperlipidemia Brother   . Mental illness Brother   . Cancer Sister 53       breast cancer  . Mental illness Sister   . Asthma Sister   . Thyroid disease Sister   . COPD Sister   . Mental illness Sister   . COPD Brother   . Stroke Brother   . Hyperlipidemia Brother   . Hypertension Brother   . Mental illness Brother   . Mental illness Sister   . Mental illness Brother   . Mental illness Brother   . GER disease Son     Past Surgical History:  Procedure Laterality Date  . CHOLECYSTECTOMY    . INCONTINENCE SURGERY  2009  . SPINE SURGERY  05/2014   Cervical spine surgery C4-5, C5-6.  [place].  Marland Kitchen TOTAL ABDOMINAL HYSTERECTOMY W/ BILATERAL SALPINGOOPHORECTOMY  2009   with repair of cystocele and rectocele     Social History   Occupational History  . Occupation: HAIR DRESSER  Tobacco Use  . Smoking status: Never Smoker  . Smokeless tobacco: Never Used  Substance and Sexual Activity  . Alcohol use: No  . Drug use: No  . Sexual activity: Never    Birth control/protection: Surgical, Post-menopausal

## 2019-05-23 DIAGNOSIS — F4323 Adjustment disorder with mixed anxiety and depressed mood: Secondary | ICD-10-CM | POA: Diagnosis not present

## 2019-05-24 ENCOUNTER — Encounter: Payer: Self-pay | Admitting: Physical Medicine and Rehabilitation

## 2019-05-24 ENCOUNTER — Other Ambulatory Visit: Payer: Self-pay

## 2019-05-24 ENCOUNTER — Other Ambulatory Visit: Payer: Self-pay | Admitting: *Deleted

## 2019-05-24 ENCOUNTER — Encounter
Payer: BC Managed Care – PPO | Attending: Physical Medicine and Rehabilitation | Admitting: Physical Medicine and Rehabilitation

## 2019-05-24 VITALS — BP 113/65 | HR 78 | Temp 97.7°F | Ht 61.5 in | Wt 214.0 lb

## 2019-05-24 DIAGNOSIS — M797 Fibromyalgia: Secondary | ICD-10-CM | POA: Insufficient documentation

## 2019-05-24 DIAGNOSIS — E559 Vitamin D deficiency, unspecified: Secondary | ICD-10-CM

## 2019-05-24 DIAGNOSIS — M7918 Myalgia, other site: Secondary | ICD-10-CM | POA: Diagnosis not present

## 2019-05-24 DIAGNOSIS — M329 Systemic lupus erythematosus, unspecified: Secondary | ICD-10-CM

## 2019-05-24 DIAGNOSIS — N3941 Urge incontinence: Secondary | ICD-10-CM

## 2019-05-24 DIAGNOSIS — R208 Other disturbances of skin sensation: Secondary | ICD-10-CM | POA: Insufficient documentation

## 2019-05-24 DIAGNOSIS — Z79899 Other long term (current) drug therapy: Secondary | ICD-10-CM | POA: Diagnosis not present

## 2019-05-24 DIAGNOSIS — M5126 Other intervertebral disc displacement, lumbar region: Secondary | ICD-10-CM

## 2019-05-24 DIAGNOSIS — M19041 Primary osteoarthritis, right hand: Secondary | ICD-10-CM

## 2019-05-24 DIAGNOSIS — M19042 Primary osteoarthritis, left hand: Secondary | ICD-10-CM

## 2019-05-24 MED ORDER — HYDROXYZINE HCL 25 MG PO TABS: 25.0000 mg | ORAL_TABLET | Freq: Every evening | ORAL | 5 refills | Status: DC | PRN

## 2019-05-24 MED ORDER — GABAPENTIN 300 MG PO CAPS: 300.0000 mg | ORAL_CAPSULE | Freq: Three times a day (TID) | ORAL | 5 refills | Status: DC

## 2019-05-24 NOTE — Patient Instructions (Signed)
Pt is a 58 yr old with severe depression, allodynia, Lupus, fibromyalgia, and myofascial pain syndrome, and piriformis syndrome  1. Restart Lexapro - is on no other Antidepressant, so needs to be on Lexapro. Psychiatrist is Dr [name]- 2. Restart Gabapentin 300 mg nightly x 3 days then 300 mg 2x/day x 3 days then 1 tab 3x/day- for nerve pain- will prescribe with Lyrica since both are at low doses for each medicine, but has tried each ne by itself without as good a results.  3.   I'm happy to speak with psychiatry if any concerns, but need to be on SSRI due to severe depression- esp with Gabapentin/Lyrica combo. Serotonin syndrome is not at high risk with this combination.  4. Hydroxyzine- as needed nightly for bladder inflammation. 5. F/U 4 weeks
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
## 2019-05-25 LAB — CBC WITH DIFFERENTIAL/PLATELET
Absolute Monocytes: 504 cells/uL (ref 200–950)
Basophils Absolute: 48 cells/uL (ref 0–200)
Basophils Relative: 0.9 %
Eosinophils Absolute: 32 cells/uL (ref 15–500)
Eosinophils Relative: 0.6 %
HCT: 37.4 % (ref 35.0–45.0)
Hemoglobin: 12.4 g/dL (ref 11.7–15.5)
Lymphs Abs: 1076 cells/uL (ref 850–3900)
MCH: 28.2 pg (ref 27.0–33.0)
MCHC: 33.2 g/dL (ref 32.0–36.0)
MCV: 85 fL (ref 80.0–100.0)
MPV: 11 fL (ref 7.5–12.5)
Monocytes Relative: 9.5 %
Neutro Abs: 3641 cells/uL (ref 1500–7800)
Neutrophils Relative %: 68.7 %
Platelets: 288 10*3/uL (ref 140–400)
RBC: 4.4 10*6/uL (ref 3.80–5.10)
RDW: 14.3 % (ref 11.0–15.0)
Total Lymphocyte: 20.3 %
WBC: 5.3 10*3/uL (ref 3.8–10.8)

## 2019-05-25 LAB — COMPLETE METABOLIC PANEL WITH GFR
AG Ratio: 1.4 (calc) (ref 1.0–2.5)
ALT: 21 U/L (ref 6–29)
AST: 21 U/L (ref 10–35)
Albumin: 4.1 g/dL (ref 3.6–5.1)
Alkaline phosphatase (APISO): 70 U/L (ref 37–153)
BUN: 9 mg/dL (ref 7–25)
CO2: 28 mmol/L (ref 20–32)
Calcium: 9.4 mg/dL (ref 8.6–10.4)
Chloride: 99 mmol/L (ref 98–110)
Creat: 0.94 mg/dL (ref 0.50–1.05)
GFR, Est African American: 77 mL/min/{1.73_m2} (ref >=60)
GFR, Est Non African American: 66 mL/min/{1.73_m2} (ref >=60)
Globulin: 2.9 g/dL (calc) (ref 1.9–3.7)
Glucose, Bld: 92 mg/dL (ref 65–99)
Potassium: 4.1 mmol/L (ref 3.5–5.3)
Sodium: 137 mmol/L (ref 135–146)
Total Bilirubin: 0.5 mg/dL (ref 0.2–1.2)
Total Protein: 7 g/dL (ref 6.1–8.1)

## 2019-05-25 LAB — VITAMIN D 25 HYDROXY (VIT D DEFICIENCY, FRACTURES): Vit D, 25-Hydroxy: 28 ng/mL — ABNORMAL LOW (ref 30–100)

## 2019-05-25 NOTE — Progress Notes (Signed)
Virtual Visit via Telephone Note  I connected with Terri Hurley on 05/29/19 at 10:20 AM EST by telephone and verified that I am speaking with the correct person using two identifiers.  Location: Patient: Home  Provider: Clinic  This service was conducted via virtual visit. The patient was located at home. I was located in my office.  Consent was obtained prior to the virtual visit and is aware of possible charges through their insurance for this visit.  The patient is an established patient.  Dr. Estanislado Pandy, MD conducted the virtual visit and Hazel Sams, PA-C acted as scribe during the service.  Office staff helped with scheduling follow up visits after the service was conducted.   I discussed the limitations, risks, security and privacy concerns of performing an evaluation and management service by telephone and the availability of in person appointments. I also discussed with the patient that there may be a patient responsible charge related to this service. The patient expressed understanding and agreed to proceed.  CC: Generalized pain  History of Present Illness: Patient is a 58 year old female with past medical history of systemic lupus erythematosus and fibromyalgia.  She has not had any signs or symptoms of a lupus flare.  She has not had any recent rashes, Raynaud's, or oral or nasal ulcerations.  She has chronic sicca symptoms.  She is seeing a pain management specialist.  She continues to have generalized muscle aches and muscle tenderness.  She has chronic pain in both hands exacerbate by use.  She states her lower back pain has improved since having an epidural injection.  Her lower back pain is aggravated by moping or standing prolonged periods of time.   Review of Systems  Constitutional: Positive for malaise/fatigue. Negative for fever.  Eyes: Negative for photophobia, pain, discharge and redness.       +Dry eyes  Respiratory: Negative for cough, shortness of breath and wheezing.    Cardiovascular: Negative for chest pain and palpitations.  Gastrointestinal: Negative for blood in stool, constipation and diarrhea.  Genitourinary: Negative for dysuria.  Musculoskeletal: Positive for back pain, joint pain and myalgias. Negative for neck pain.  Skin: Negative for rash.  Neurological: Negative for dizziness and headaches.  Psychiatric/Behavioral: Positive for depression. The patient is nervous/anxious. The patient does not have insomnia.       Observations/Objective: Physical Exam  Constitutional: She is oriented to person, place, and time.  Neurological: She is alert and oriented to person, place, and time.  Psychiatric: Mood, memory, affect and judgment normal.   Patient reports morning stiffness for 30 minutes.   Patient reports nocturnal pain.  Difficulty dressing/grooming: Denies Difficulty climbing stairs: Reports Difficulty getting out of chair: Reports Difficulty using hands for taps, buttons, cutlery, and/or writing: Denies   Assessment and Plan: Visit Diagnoses: Other systemic lupus erythematosus with other organ involvement (HCC) - Positive ANA, positive double-stranded DNA, positive CB CAP, positive anticardiolipin, positive beta-2 GP 1, malar rash, fatigue and arthralgias.  She is on low-dose aspirin due to positive anticardiolipin and lupus anticoagulant antibodies: She has not had any signs or symptoms of a lupus flare recently.  She is clinically doing well on Plaquenil 200 mg 1 tablet by mouth twice daily M-F.  She has chronic fatigue related to insomnia and fibromyalgia pain.  She has not had any enlarged lymph nodes or fevers recently. She has intermittent pain in both hands but no joint swelling.  She has not had any recent rashes, oral or nasal  ulcerations, or Raynaud's. She continues to have chronic sicca symptoms.  She will continue taking PLQ as prescribed.  A refill of PLQ and vitamin D 50,000 units by mouth once monthly will be sent to the  pharmacy. We will check autoimmune lab work at her follow up visit in 3-4 months.  She was advised to notify us if she develops any signs or symptoms of a flare.   High risk medication use - PLQ 200 mg 1 tablet twice daily Monday to Friday she reduce this dose to 1 tablet p.o. daily sometimes due to GI side effects.  (higher dose caused GI SE). CBC and CMP were drawn on 05/24/19.   Fibromyalgia- She has generalized muscle aches and muscle tenderness due to fibromyalgia.  She has frequent fibromyalgia flares.  She continues to suffer from anxiety and depression.  She has difficulty sleeping at night. She has chronic fatigue secondary to insomnia and chronic pain. Good sleep hygiene was discussed. She continues to take Wellbutrin, gabapentin, and Lexapro as prescribed.  Primary insomnia-Secondary to nocturnal pain and anxiety.  Good sleep hygiene was discussed.   Other fatigue-due to chronic pain and insomnia.  Primary osteoarthritis of both hands: She has intermittent pain in both hands.  No joint swelling.  Joint protection and muscle strengthening were discussed.   DDD (degenerative disc disease), cervical: She has chronic neck pain and stiffness.   DDD (degenerative disc disease), lumbar: Her lower back pain has improved since having an epidural injection.  Her symptoms are aggravated by moping and standing for prolonged periods of time. She is followed by a pain management specialist.   Other medical conditions are listed as follow:   Fatty liver disease, nonalcoholic  Essential hypertension-her blood pressure is well controlled today.  Gastroesophageal reflux disease without esophagitis-she has chronic reflux issues.  Vitamin D deficiency-her vitamin D was low at 20 on December 27, 2018.  She is on vitamin D supplement.  I have advised her to return for vitamin D level in October.  Family history of systemic lupus erythematosus-sister   Follow Up Instructions: She will follow  up in 3-4 months.    I discussed the assessment and treatment plan with the patient. The patient was provided an opportunity to ask questions and all were answered. The patient agreed with the plan and demonstrated an understanding of the instructions.   The patient was advised to call back or seek an in-person evaluation if the symptoms worsen or if the condition fails to improve as anticipated.  I provided 25 minutes of non-face-to-face time during this encounter.   Bo Merino, MD   Scribed by-  Hazel Sams, PA-C

## 2019-05-29 ENCOUNTER — Other Ambulatory Visit: Payer: Self-pay

## 2019-05-29 ENCOUNTER — Telehealth (INDEPENDENT_AMBULATORY_CARE_PROVIDER_SITE_OTHER): Payer: BC Managed Care – PPO | Admitting: Rheumatology

## 2019-05-29 ENCOUNTER — Encounter: Payer: Self-pay | Admitting: Rheumatology

## 2019-05-29 DIAGNOSIS — E559 Vitamin D deficiency, unspecified: Secondary | ICD-10-CM

## 2019-05-29 DIAGNOSIS — M19041 Primary osteoarthritis, right hand: Secondary | ICD-10-CM

## 2019-05-29 DIAGNOSIS — I10 Essential (primary) hypertension: Secondary | ICD-10-CM

## 2019-05-29 DIAGNOSIS — M5030 Other cervical disc degeneration, unspecified cervical region: Secondary | ICD-10-CM

## 2019-05-29 DIAGNOSIS — R5383 Other fatigue: Secondary | ICD-10-CM

## 2019-05-29 DIAGNOSIS — M3219 Other organ or system involvement in systemic lupus erythematosus: Secondary | ICD-10-CM

## 2019-05-29 DIAGNOSIS — M797 Fibromyalgia: Secondary | ICD-10-CM

## 2019-05-29 DIAGNOSIS — M19042 Primary osteoarthritis, left hand: Secondary | ICD-10-CM

## 2019-05-29 DIAGNOSIS — F5101 Primary insomnia: Secondary | ICD-10-CM | POA: Diagnosis not present

## 2019-05-29 DIAGNOSIS — M5136 Other intervertebral disc degeneration, lumbar region: Secondary | ICD-10-CM

## 2019-05-29 DIAGNOSIS — Z79899 Other long term (current) drug therapy: Secondary | ICD-10-CM

## 2019-05-29 DIAGNOSIS — K760 Fatty (change of) liver, not elsewhere classified: Secondary | ICD-10-CM

## 2019-05-29 DIAGNOSIS — K219 Gastro-esophageal reflux disease without esophagitis: Secondary | ICD-10-CM

## 2019-05-29 DIAGNOSIS — Z8269 Family history of other diseases of the musculoskeletal system and connective tissue: Secondary | ICD-10-CM

## 2019-05-29 MED ORDER — HYDROXYCHLOROQUINE SULFATE 200 MG PO TABS: ORAL_TABLET | ORAL | 0 refills | Status: DC

## 2019-05-29 MED ORDER — VITAMIN D (ERGOCALCIFEROL) 1.25 MG (50000 UNIT) PO CAPS: 50000.0000 [IU] | ORAL_CAPSULE | ORAL | 0 refills | Status: DC

## 2019-05-29 NOTE — Addendum Note (Signed)
Addended by: Earnestine Mealing on: 05/29/2019 04:03 PM   Modules accepted: Orders

## 2019-05-31 NOTE — Telephone Encounter (Signed)
Message from patient

## 2019-06-06 DIAGNOSIS — F4323 Adjustment disorder with mixed anxiety and depressed mood: Secondary | ICD-10-CM | POA: Diagnosis not present

## 2019-06-12 ENCOUNTER — Telehealth: Payer: Self-pay | Admitting: Rheumatology

## 2019-06-12 NOTE — Telephone Encounter (Signed)
-----   Message from Shona Needles, RT sent at 05/30/2019 12:44 PM EST ----- Regarding: 3-4 MONTH F/U

## 2019-06-12 NOTE — Telephone Encounter (Signed)
LMOM for patient to call and schedule 3-4 month follow-up appointment (around 09/27/2019)

## 2019-06-19 ENCOUNTER — Telehealth: Payer: Self-pay | Admitting: Orthopedic Surgery

## 2019-06-19 DIAGNOSIS — M5136 Other intervertebral disc degeneration, lumbar region: Secondary | ICD-10-CM

## 2019-06-19 DIAGNOSIS — M5126 Other intervertebral disc displacement, lumbar region: Secondary | ICD-10-CM

## 2019-06-19 NOTE — Addendum Note (Signed)
Addended by: Meyer Cory on: 06/19/2019 09:10 AM   Modules accepted: Orders

## 2019-06-19 NOTE — Telephone Encounter (Signed)
Ok thanks 

## 2019-06-19 NOTE — Telephone Encounter (Signed)
Terri Hurley states that her "back went out again" and she would like to get another ESI with Dr. Ernestina Patches.  She thinks her last injection was a about 2-3 months ago.

## 2019-06-19 NOTE — Telephone Encounter (Signed)
Referral entered. I called patient and advised.

## 2019-06-19 NOTE — Telephone Encounter (Signed)
OK to enter referral for Dr. Ernestina Patches?

## 2019-06-20 ENCOUNTER — Ambulatory Visit: Payer: BC Managed Care – PPO | Admitting: Rheumatology

## 2019-06-20 NOTE — Procedures (Signed)
S1 Lumbosacral Transforaminal Epidural Steroid Injection - Sub-Pedicular Approach with Fluoroscopic Guidance   Patient: Terri Hurley      Date of Birth: Jul 25, 1960 MRN: [id number] PCP: [name], MD      Visit Date: 05/01/2019   Universal Protocol:    Date/Time: 12/29/208:19 AM  Consent Given By: the patient  Position:  PRONE  Additional Comments: Vital signs were monitored before and after the procedure. Patient was prepped and draped in the usual sterile fashion. The correct patient, procedure, and site was verified.   Injection Procedure Details:  Procedure Site One Meds Administered:  Meds ordered this encounter  Medications  . betamethasone acetate-betamethasone sodium phosphate (CELESTONE) injection 12 mg    Laterality: Bilateral  Location/Site:  S1 Foramen   Needle size: 22 ga.  Needle type: Spinal  Needle Placement: Transforaminal  Findings:   -Comments: Excellent flow of contrast along the nerve and into the epidural space.  Epidurogram: Contrast epidurogram showed no nerve root cut off or restricted flow pattern.  Procedure Details: After squaring off the sacral end-plate to get a true AP view, the C-arm was positioned so that the best possible view of the S1 foramen was visualized. The soft tissues overlying this structure were infiltrated with 2-3 ml. of 1% Lidocaine without Epinephrine.    The spinal needle was inserted toward the target using a "trajectory" view along the fluoroscope beam.  Under AP and lateral visualization, the needle was advanced so it did not puncture dura. Biplanar projections were used to confirm position. Aspiration was confirmed to be negative for CSF and/or blood. A 1-2 ml. volume of Isovue-250 was injected and flow of contrast was noted at each level. Radiographs were obtained for documentation purposes.   After attaining the desired flow of contrast documented above, a 0.5 to 1.0 ml test dose of 0.25% Marcaine was  injected into each respective transforaminal space.  The patient was observed for 90 seconds post injection.  After no sensory deficits were reported, and normal lower extremity motor function was noted,   the above injectate was administered so that equal amounts of the injectate were placed at each foramen (level) into the transforaminal epidural space.   Additional Comments:  The patient tolerated the procedure well Dressing: Band-Aid with 2 x 2 sterile gauze    Post-procedure details: Patient was observed during the procedure. Post-procedure instructions were reviewed.  Patient left the clinic in stable condition.

## 2019-06-20 NOTE — Progress Notes (Signed)
Terri Hurley - 58 y.o. female MRN [id number]  Date of birth: September 02, 1960  Office Visit Note: Visit Date: 05/01/2019 PCP: [name], MD Referred by: [name], MD  Subjective: Chief Complaint  Patient presents with  . Lower Back - Pain   HPI: Terri Hurley is a 58 y.o. female who comes in today For planned bilateral S1 transforaminal epidural steroid injection at the request of Dr. Rodell Perna for chronic severe low back pain and bilateral leg pain not responsive to conservative care.  ROS Otherwise per HPI.  Assessment & Plan: Visit Diagnoses:  1. Radiculopathy due to lumbar intervertebral disc disorder     Plan: No additional findings.   Meds & Orders:  Meds ordered this encounter  Medications  . betamethasone acetate-betamethasone sodium phosphate (CELESTONE) injection 12 mg    Orders Placed This Encounter  Procedures  . XR C-ARM NO REPORT  . Epidural Steroid injection    Follow-up: Return for Rodell Perna, MD.   Procedures: No procedures performed  S1 Lumbosacral Transforaminal Epidural Steroid Injection - Sub-Pedicular Approach with Fluoroscopic Guidance   Patient: Terri Hurley      Date of Birth: 1960/08/11 MRN: [id number] PCP: [name], MD      Visit Date: 05/01/2019   Universal Protocol:    Date/Time: 12/29/208:19 AM  Consent Given By: the patient  Position:  PRONE  Additional Comments: Vital signs were monitored before and after the procedure. Patient was prepped and draped in the usual sterile fashion. The correct patient, procedure, and site was verified.   Injection Procedure Details:  Procedure Site One Meds Administered:  Meds ordered this encounter  Medications  . betamethasone acetate-betamethasone sodium phosphate (CELESTONE) injection 12 mg    Laterality: Bilateral  Location/Site:  S1 Foramen   Needle size: 22 ga.  Needle type: Spinal  Needle Placement: Transforaminal  Findings:   -Comments: Excellent flow of  contrast along the nerve and into the epidural space.  Epidurogram: Contrast epidurogram showed no nerve root cut off or restricted flow pattern.  Procedure Details: After squaring off the sacral end-plate to get a true AP view, the C-arm was positioned so that the best possible view of the S1 foramen was visualized. The soft tissues overlying this structure were infiltrated with 2-3 ml. of 1% Lidocaine without Epinephrine.    The spinal needle was inserted toward the target using a "trajectory" view along the fluoroscope beam.  Under AP and lateral visualization, the needle was advanced so it did not puncture dura. Biplanar projections were used to confirm position. Aspiration was confirmed to be negative for CSF and/or blood. A 1-2 ml. volume of Isovue-250 was injected and flow of contrast was noted at each level. Radiographs were obtained for documentation purposes.   After attaining the desired flow of contrast documented above, a 0.5 to 1.0 ml test dose of 0.25% Marcaine was injected into each respective transforaminal space.  The patient was observed for 90 seconds post injection.  After no sensory deficits were reported, and normal lower extremity motor function was noted,   the above injectate was administered so that equal amounts of the injectate were placed at each foramen (level) into the transforaminal epidural space.   Additional Comments:  The patient tolerated the procedure well Dressing: Band-Aid with 2 x 2 sterile gauze    Post-procedure details: Patient was observed during the procedure. Post-procedure instructions were reviewed.  Patient left the clinic in stable condition.     Clinical History: MRI  LUMBAR SPINE WITHOUT CONTRAST  TECHNIQUE: Multiplanar, multisequence MR imaging of the lumbar spine was performed. No intravenous contrast was administered.  COMPARISON:  Two views lumbar spine 01/10/2019. MRI lumbar spine 11/05/2011.  FINDINGS: Segmentation:   Standard.  Alignment: No listhesis. Convex right scoliosis with the apex at approximately L3-4.  Vertebrae:  No fracture, evidence of discitis, or bone lesion.  Conus medullaris and cauda equina: Conus extends to the L1 level. Conus and cauda equina appear normal.  Paraspinal and other soft tissues: Negative.  Disc levels:  T11-12: Negative.  T12-L1: Negative.  L1-2: Shallow disc bulge without stenosis.  L2-3: Shallow disc bulge without stenosis.  L3-4: Shallow disc bulge without stenosis.  L4-5: Shallow disc bulge and mild facet degenerative disease. No stenosis.  L5-S1: The patient has a new central disc protrusion indenting the ventral thecal sac. A sequestered disc fragment measuring 0.9 cm craniocaudal x 0.7 cm AP x 0.4 cm transverse is seen in the left subarticular recess inferior to the disc interspace. The fragment contacts the left S1 root without compression or displacement. The foramina are widely patent.  IMPRESSION: New central disc protrusion at L5-S1 indents the ventral thecal sac but the central canal appears open. There is also a new sequestered disc fragment inferior to the L5-S1 disc interspace in the left subarticular recess. The fragment contacts the left S1 root without compression or displacement.  Mild disc bulging L1-2 to L4-5 without stenosis.   Electronically Signed   By: Inge Rise M.D.   On: 03/29/2019 15:03   She reports that she has never smoked. She has never used smokeless tobacco. No results for input(s): HGBA1C, LABURIC in the last 8760 hours.  Objective:  VS:  HT:    WT:   BMI:     BP:119/77  HR:79bpm  TEMP: ( )  RESP:  Physical Exam  Ortho Exam Imaging: No results found.  Past Medical/Family/Surgical/Social History: Medications & Allergies reviewed per EMR, new medications updated. Patient Active Problem List   Diagnosis Date Noted  . Protrusion of lumbar intervertebral disc 05/16/2019  .  Allodynia 03/23/2019  . Fibromyalgia 03/23/2019  . Myofascial pain dysfunction syndrome 03/23/2019  . SLE (systemic lupus erythematosus) ([place])   . Primary osteoarthritis of both hands 10/01/2017  . DDD (degenerative disc disease), cervical 08/23/2017  . DDD (degenerative disc disease), lumbar 08/23/2017  . Essential hypertension 01/28/2016  . Incontinence of urine 12/26/2010  . Depression   . GERD (gastroesophageal reflux disease)   . Fatty liver disease, nonalcoholic   . HNP (herniated nucleus pulposus), cervical   . Recurrent genital HSV (herpes simplex virus) infection   . Plantar fasciitis   . Elevated liver enzymes 12/12/2010  . Vitamin D deficiency 12/12/2010   Past Medical History:  Diagnosis Date  . Arthritis    low back, hip, hands  . Depression    Sees [name], NP @ [facility]  counseling center.Marland Kitchen Has h/o hospitalization  . Fatty liver disease, nonalcoholic    clinical diagnosis by endocrinologist  . Fibromyalgia   . GERD (gastroesophageal reflux disease)    controlled with PPI therapy  . Hepatitis unknown type   . High cholesterol    No medical therapy. Last lab March '12  LDL 91, T. Chol 170. Minimal elevation in '08  . HNP (herniated nucleus pulposus), cervical    C6-7. Has had PT, no surgery  . Plantar fasciitis   . Recurrent genital HSV (herpes simplex virus) infection    on daily suppression with  acyclovir  . SLE (systemic lupus erythematosus) ([facility])    sees rheum, on plaquenil  . Urinary incontinence   . Vitamin D deficiency    lab '09  Vit D = 19   Family History  Problem Relation Age of Onset  . Heart disease Mother 64       AMI  . Diabetes Mother        peripheral neuropathy  . Hypertension Mother   . Hyperlipidemia Mother   . Mental illness Mother        depression and anxiety  . Gout Mother   . Diabetes Father   . Heart disease Father   . Hyperlipidemia Father   . Mental illness Father        depression and anxiety  . Heart  disease Brother        arrythmia, DCC  . Hyperlipidemia Brother   . Mental illness Brother   . Cancer Sister 90       breast cancer  . Mental illness Sister   . Asthma Sister   . Thyroid disease Sister   . COPD Sister   . Mental illness Sister   . COPD Brother   . Stroke Brother   . Hyperlipidemia Brother   . Hypertension Brother   . Mental illness Brother   . Mental illness Sister   . Mental illness Brother   . Mental illness Brother   . GER disease Son    Past Surgical History:  Procedure Laterality Date  . CHOLECYSTECTOMY    . INCONTINENCE SURGERY  2009  . SPINE SURGERY  05/2014   Cervical spine surgery C4-5, C5-6.  [place].  Marland Kitchen TOTAL ABDOMINAL HYSTERECTOMY W/ BILATERAL SALPINGOOPHORECTOMY  2009   with repair of cystocele and rectocele    Social History   Occupational History  . Occupation: HAIR DRESSER  Tobacco Use  . Smoking status: Never Smoker  . Smokeless tobacco: Never Used  Substance and Sexual Activity  . Alcohol use: No  . Drug use: No  . Sexual activity: Never    Birth control/protection: Surgical, Post-menopausal

## 2019-06-21 ENCOUNTER — Other Ambulatory Visit: Payer: Self-pay

## 2019-06-21 ENCOUNTER — Encounter (HOSPITAL_BASED_OUTPATIENT_CLINIC_OR_DEPARTMENT_OTHER): Payer: BC Managed Care – PPO | Admitting: Physical Medicine and Rehabilitation

## 2019-06-21 ENCOUNTER — Encounter: Payer: Self-pay | Admitting: Physical Medicine and Rehabilitation

## 2019-06-21 VITALS — BP 136/87 | HR 73 | Temp 97.8°F | Ht 61.5 in | Wt 220.0 lb

## 2019-06-21 DIAGNOSIS — M5126 Other intervertebral disc displacement, lumbar region: Secondary | ICD-10-CM | POA: Diagnosis not present

## 2019-06-21 DIAGNOSIS — M797 Fibromyalgia: Secondary | ICD-10-CM | POA: Diagnosis not present

## 2019-06-21 DIAGNOSIS — M329 Systemic lupus erythematosus, unspecified: Secondary | ICD-10-CM

## 2019-06-21 DIAGNOSIS — M7918 Myalgia, other site: Secondary | ICD-10-CM

## 2019-06-21 DIAGNOSIS — R208 Other disturbances of skin sensation: Secondary | ICD-10-CM | POA: Diagnosis not present

## 2019-06-21 DIAGNOSIS — N3289 Other specified disorders of bladder: Secondary | ICD-10-CM

## 2019-06-21 MED ORDER — FLAVOXATE HCL 100 MG PO TABS: 100.0000 mg | ORAL_TABLET | Freq: Three times a day (TID) | ORAL | 3 refills | Status: AC | PRN

## 2019-06-21 MED ORDER — HYDROXYZINE HCL 25 MG PO TABS: 25.0000 mg | ORAL_TABLET | Freq: Every evening | ORAL | 5 refills | Status: AC | PRN

## 2019-06-21 MED ORDER — TRAMADOL HCL 50 MG PO TABS: 50.0000 mg | ORAL_TABLET | Freq: Three times a day (TID) | ORAL | 0 refills | Status: DC | PRN
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
## 2019-06-21 NOTE — Patient Instructions (Signed)
Pt is a 58 yr old with severe depression,allodynia,Lupus, fibromyalgia, and myofascial pain syndrome, and piriformis syndrome as well as possible UTI vs bladder spasms and L4/5 disc issues with need for epidural    1. Suggest you either take it/Lexapro daily or don't take it at all- not great to take as needed.  2. Can't call in Cipro since don't have easy way to get U/A and Cx Can call in more Hydroxyzine or Urispas for   3. Send in [place]- a medicine for bladder spasms as needed Can alternate with Hydroxyzine, which I also refilled.  4. Will give a short course of Tramadol- can take 50 mg up to 3x/day as needed- can last longer if takes with small dose of Tylenol- #50 ordered with no refills.  5. Continue all other meds as discussed.  6. F/U 6 weeks

## 2019-06-22 ENCOUNTER — Telehealth: Payer: Self-pay | Admitting: *Deleted

## 2019-06-22 MED ORDER — TRAMADOL HCL 50 MG PO TABS: 50.0000 mg | ORAL_TABLET | Freq: Three times a day (TID) | ORAL | 0 refills | Status: DC | PRN

## 2019-06-22 NOTE — Telephone Encounter (Signed)
I contacted the pharmacy and I contacted the patient.  Pharmacy is filling the one week supply.  Patient verbalized understanding that she is picking up a one week supply

## 2019-06-22 NOTE — Telephone Encounter (Signed)
Received a fax stating Denial for prior authorization for Tramadol #50.  I contacted [employer] and I was told that because this is a new prescription and patient has not filled narcotic analgesics in the last 180 days (confirmed by [facility]) that the 1st script must be a 7 day supply. All other scripts can be 30 day supplies thereafter. Sig states 1 tablet every 8 hrs x 7 days = 21 tabs for a 7 day supply.  Please advise

## 2019-06-22 NOTE — Telephone Encounter (Signed)
Ordered a 7 day supply.  My plan was to just get her through until her Epidural injection. Can you call her to let her know I sent in for 7 days first?  Thank you

## 2019-07-03 ENCOUNTER — Telehealth: Payer: Self-pay | Admitting: *Deleted

## 2019-07-03 ENCOUNTER — Telehealth: Payer: Self-pay | Admitting: Physical Medicine and Rehabilitation

## 2019-07-03 MED ORDER — PREGABALIN 100 MG PO CAPS: 100.0000 mg | ORAL_CAPSULE | Freq: Two times a day (BID) | ORAL | 3 refills | Status: DC

## 2019-07-03 NOTE — Telephone Encounter (Signed)
Patient contacted our office.  She is almost out of her pregabalin.  She has 1 refill left at [facility].  She has new insurance and currently it wil not cover without a prior authorization.  She is asking for Dr Dagoberto Ligas to please send a script to [name] at [facility] so she can purchase [employer] with a good Rx card. She is currently taking the medication 100 mg BID

## 2019-07-03 NOTE — Telephone Encounter (Signed)
Faxed prior authorization for [id number], case is pending.

## 2019-07-03 NOTE — Telephone Encounter (Signed)
Sent Rx in Electronically for 100 mg BID Lyrica- and 3 additional Refills.

## 2019-07-04 ENCOUNTER — Other Ambulatory Visit: Payer: Self-pay | Admitting: *Deleted

## 2019-07-04 MED ORDER — PREGABALIN 100 MG PO CAPS: 100.0000 mg | ORAL_CAPSULE | Freq: Two times a day (BID) | ORAL | 3 refills | Status: DC

## 2019-07-04 NOTE — Telephone Encounter (Signed)
Received Authorization from patient insurance and they approved code [id number].  Case Number# [id number]

## 2019-07-06 ENCOUNTER — Encounter: Payer: Self-pay | Admitting: Physical Medicine and Rehabilitation

## 2019-07-06 ENCOUNTER — Ambulatory Visit (INDEPENDENT_AMBULATORY_CARE_PROVIDER_SITE_OTHER): Payer: 59 | Admitting: Physical Medicine and Rehabilitation

## 2019-07-06 ENCOUNTER — Ambulatory Visit: Payer: Self-pay

## 2019-07-06 ENCOUNTER — Other Ambulatory Visit: Payer: Self-pay

## 2019-07-06 VITALS — BP 123/78 | HR 70

## 2019-07-06 DIAGNOSIS — M5116 Intervertebral disc disorders with radiculopathy, lumbar region: Secondary | ICD-10-CM | POA: Diagnosis not present

## 2019-07-06 MED ORDER — METHYLPREDNISOLONE ACETATE 80 MG/ML IJ SUSP: 40.0000 mg | Freq: Once | INTRAMUSCULAR | Status: DC

## 2019-07-06 NOTE — Progress Notes (Signed)
Patient states she has pain in her lower back down into her buttocks States she has no leg pain. Patient states pain is more on her left side. Patient states she cant stand or walk long periods due to increase in pain. Patient states laying down with heat for 30 mins eases the pain. Patient states tramadol doesn't help much. Patient states that she received 6 weeks of relief from last  injection  She stated it might have lasted longer but she stated she was moving boxes and pain returned. She stated she bought a back brace from [employer] but it rolls up on her and doesn't help pain.  Numeric Pain Rating Scale and Functional Assessment Average Pain (10)   In the last MONTH (on 0-10 scale) has pain interfered with the following?  1. General activity like being  able to carry out your everyday physical activities such as walking, climbing stairs, carrying groceries, or moving a chair?  Rating(10)   +Driver, -BT, -Dye Allergies.

## 2019-07-18 ENCOUNTER — Encounter: Payer: Self-pay | Admitting: Orthopaedic Surgery

## 2019-07-18 ENCOUNTER — Other Ambulatory Visit: Payer: Self-pay

## 2019-07-18 ENCOUNTER — Ambulatory Visit (INDEPENDENT_AMBULATORY_CARE_PROVIDER_SITE_OTHER): Payer: 59 | Admitting: Orthopaedic Surgery

## 2019-07-18 ENCOUNTER — Telehealth: Payer: Self-pay | Admitting: Orthopaedic Surgery

## 2019-07-18 VITALS — Ht 61.5 in | Wt 220.0 lb

## 2019-07-18 DIAGNOSIS — M5126 Other intervertebral disc displacement, lumbar region: Secondary | ICD-10-CM | POA: Diagnosis not present

## 2019-07-18 DIAGNOSIS — M5136 Other intervertebral disc degeneration, lumbar region: Secondary | ICD-10-CM | POA: Diagnosis not present

## 2019-07-18 NOTE — Telephone Encounter (Signed)
OK. She will do best if she does not use it much. Ok to do thanks

## 2019-07-18 NOTE — Progress Notes (Signed)
Office Visit Note   Patient: Terri Hurley           Date of Birth: 1961-06-18           MRN: [id number] Visit Date: 07/18/2019              Requested by: [name], MD [address] [place],  [place] [address] PCP: [name], MD   Assessment & Plan: Visit Diagnoses:  1. Protrusion of lumbar intervertebral disc   2. DDD (degenerative disc disease), lumbar     Plan: Patient is fidgety shaking some with her legs some tremor of her hands.  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  We reviewed her MRI scan as well as the report.  She does have some disc protrusion at L5-S1 just touches the nerve but does not compress it.  She is having neurogenic claudication symptoms but is does not have any areas of spinal stenosis that would correlate with her symptoms.  She can work on a walking program to sit and rest and then get up and repeat this.  She states she cannot walk very far we discussed walking short distances and sitting resting and then repeating.  I plan to check her back again in 3 months.  She got less than normal satisfactory result from her cervical fusion.  Patient reflex and isolated motor test does not reveal any radiculopathy.  Recheck 3 months.  Follow-Up Instructions: Return in about 3 months (around 10/16/2019).   Orders:  No orders of the defined types were placed in this encounter.  No orders of the defined types were placed in this encounter.     Procedures: No procedures performed   Clinical Data: No additional findings.   Subjective: Chief Complaint  Patient presents with  . Lower Back - Pain, Follow-up    HPI 59 year old female returns post epidural injection 07/06/2019.  Patient states it helped her get more mobile.  She is ambulating better but still has pain at the lumbosacral junction.  She gets relief when she sits.  She states she cannot walk very far.  She is having trouble with  depression.  She brought with her form given to her by her attorney with multiple questions about impairment and patient's functional capacity with bending twisting etc.  I discussed with her that we do not do so security disability determination exams.  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  She has been less mobile she has gained weight.  She is on medication for depression 2 different meds.  Patient had past history of cervical fusion by spine and scoliosis surgery she states she still has some problems with her neck has discomfort when she turns but states some of the pain is improved with the surgery.  Review of Systems 14 point update unchanged from 05/16/2019 office visit other than as mentioned in HPI.   Objective: Vital Signs: Ht 5' 1.5" (1.562 m)   Wt 220 lb (99.8 kg)   BMI 40.90 kg/m   Physical Exam Constitutional:      Appearance: She is well-developed.  HENT:     Head: Normocephalic.     Right Ear: External ear normal.     Left Ear: External ear normal.  Eyes:     Pupils: Pupils are equal, round, and reactive to light.  Neck:     Thyroid: No thyromegaly.     Trachea:  No tracheal deviation.  Cardiovascular:     Rate and Rhythm: Normal rate.  Pulmonary:     Effort: Pulmonary effort is normal.  Abdominal:     Palpations: Abdomen is soft.  Skin:    General: Skin is warm and dry.  Neurological:     Mental Status: She is alert and oriented to person, place, and time.  Psychiatric:        Behavior: Behavior normal.     Ortho Exam patient gets from sitting to standing.  Normal heel toe gait.  She has some tenderness with palpation over the lumbosacral junction which she withdraws from.  Negative logroll of the hips.  Reflexes are intact.  Specialty Comments:  No specialty comments available.  Imaging: CLINICAL DATA:  Low back and bilateral leg pain, right worse than left, for 2 months.  EXAM: MRI LUMBAR SPINE WITHOUT  CONTRAST  TECHNIQUE: Multiplanar, multisequence MR imaging of the lumbar spine was performed. No intravenous contrast was administered.  COMPARISON:  Two views lumbar spine 01/10/2019. MRI lumbar spine 11/05/2011.  FINDINGS: Segmentation:  Standard.  Alignment: No listhesis. Convex right scoliosis with the apex at approximately L3-4.  Vertebrae:  No fracture, evidence of discitis, or bone lesion.  Conus medullaris and cauda equina: Conus extends to the L1 level. Conus and cauda equina appear normal.  Paraspinal and other soft tissues: Negative.  Disc levels:  T11-12: Negative.  T12-L1: Negative.  L1-2: Shallow disc bulge without stenosis.  L2-3: Shallow disc bulge without stenosis.  L3-4: Shallow disc bulge without stenosis.  L4-5: Shallow disc bulge and mild facet degenerative disease. No stenosis.  L5-S1: The patient has a new central disc protrusion indenting the ventral thecal sac. A sequestered disc fragment measuring 0.9 cm craniocaudal x 0.7 cm AP x 0.4 cm transverse is seen in the left subarticular recess inferior to the disc interspace. The fragment contacts the left S1 root without compression or displacement. The foramina are widely patent.  IMPRESSION: New central disc protrusion at L5-S1 indents the ventral thecal sac but the central canal appears open. There is also a new sequestered disc fragment inferior to the L5-S1 disc interspace in the left subarticular recess. The fragment contacts the left S1 root without compression or displacement.  Mild disc bulging L1-2 to L4-5 without stenosis.   Electronically Signed   By: Inge Rise M.D.   On: 03/29/2019 15:03   PMFS History: Patient Active Problem List   Diagnosis Date Noted  . Bladder spasms 06/21/2019  . Protrusion of lumbar intervertebral disc 05/16/2019  . Allodynia 03/23/2019  . Fibromyalgia 03/23/2019  . Myofascial pain dysfunction syndrome 03/23/2019  .  SLE (systemic lupus erythematosus) ([facility])   . Primary osteoarthritis of both hands 10/01/2017  . DDD (degenerative disc disease), cervical 08/23/2017  . DDD (degenerative disc disease), lumbar 08/23/2017  . Essential hypertension 01/28/2016  . Incontinence of urine 12/26/2010  . Depression   . GERD (gastroesophageal reflux disease)   . Fatty liver disease, nonalcoholic   . HNP (herniated nucleus pulposus), cervical   . Recurrent genital HSV (herpes simplex virus) infection   . Plantar fasciitis   . Elevated liver enzymes 12/12/2010  . Vitamin D deficiency 12/12/2010   Past Medical History:  Diagnosis Date  . Arthritis    low back, hip, hands  . Depression    Sees [name], NP @ [facility]  counseling center.Marland Kitchen Has h/o hospitalization  . Fatty liver disease, nonalcoholic    clinical diagnosis by endocrinologist  . Fibromyalgia   .  GERD (gastroesophageal reflux disease)    controlled with PPI therapy  . Hepatitis unknown type   . High cholesterol    No medical therapy. Last lab March '12  LDL 91, T. Chol 170. Minimal elevation in '08  . HNP (herniated nucleus pulposus), cervical    C6-7. Has had PT, no surgery  . Plantar fasciitis   . Recurrent genital HSV (herpes simplex virus) infection    on daily suppression with acyclovir  . SLE (systemic lupus erythematosus) ([facility])    sees rheum, on plaquenil  . Urinary incontinence   . Vitamin D deficiency    lab '09  Vit D = 19    Family History  Problem Relation Age of Onset  . Heart disease Mother 73       AMI  . Diabetes Mother        peripheral neuropathy  . Hypertension Mother   . Hyperlipidemia Mother   . Mental illness Mother        depression and anxiety  . Gout Mother   . Diabetes Father   . Heart disease Father   . Hyperlipidemia Father   . Mental illness Father        depression and anxiety  . Heart disease Brother        arrythmia, DCC  . Hyperlipidemia Brother   . Mental illness Brother   . Cancer  Sister 60       breast cancer  . Mental illness Sister   . Asthma Sister   . Thyroid disease Sister   . COPD Sister   . Mental illness Sister   . COPD Brother   . Stroke Brother   . Hyperlipidemia Brother   . Hypertension Brother   . Mental illness Brother   . Mental illness Sister   . Mental illness Brother   . Mental illness Brother   . GER disease Son     Past Surgical History:  Procedure Laterality Date  . CHOLECYSTECTOMY    . INCONTINENCE SURGERY  2009  . SPINE SURGERY  05/2014   Cervical spine surgery C4-5, C5-6.  [place].  Marland Kitchen TOTAL ABDOMINAL HYSTERECTOMY W/ BILATERAL SALPINGOOPHORECTOMY  2009   with repair of cystocele and rectocele    Social History   Occupational History  . Occupation: HAIR DRESSER  Tobacco Use  . Smoking status: Never Smoker  . Smokeless tobacco: Never Used  Substance and Sexual Activity  . Alcohol use: No  . Drug use: No  . Sexual activity: Never    Birth control/protection: Surgical, Post-menopausal

## 2019-07-18 NOTE — Telephone Encounter (Signed)
Please advise. Patient is requesting rolling walker with seat.

## 2019-07-18 NOTE — Telephone Encounter (Signed)
Patient called. She would like a walking cane with seat RX. Her call back number is [phone]

## 2019-07-19 ENCOUNTER — Telehealth: Payer: Self-pay | Admitting: *Deleted

## 2019-07-19 NOTE — Telephone Encounter (Signed)
Rx for walker at front for patient to pick up. She is aware.

## 2019-07-19 NOTE — Telephone Encounter (Signed)
Prior authorization Approved over the phone for pregabalin 50mg  capsules. 07/18/2019 - 07/17/2020

## 2019-08-02 ENCOUNTER — Other Ambulatory Visit: Payer: Self-pay

## 2019-08-02 ENCOUNTER — Encounter: Payer: 59 | Attending: Physical Medicine and Rehabilitation | Admitting: Physical Medicine and Rehabilitation

## 2019-08-02 ENCOUNTER — Encounter: Payer: Self-pay | Admitting: Physical Medicine and Rehabilitation

## 2019-08-02 VITALS — BP 124/79 | HR 68 | Temp 97.7°F | Ht 61.5 in | Wt 216.0 lb

## 2019-08-02 DIAGNOSIS — M5030 Other cervical disc degeneration, unspecified cervical region: Secondary | ICD-10-CM

## 2019-08-02 DIAGNOSIS — M7918 Myalgia, other site: Secondary | ICD-10-CM | POA: Insufficient documentation

## 2019-08-02 DIAGNOSIS — R208 Other disturbances of skin sensation: Secondary | ICD-10-CM | POA: Insufficient documentation

## 2019-08-02 DIAGNOSIS — M797 Fibromyalgia: Secondary | ICD-10-CM | POA: Insufficient documentation

## 2019-08-02 DIAGNOSIS — M5020 Other cervical disc displacement, unspecified cervical region: Secondary | ICD-10-CM | POA: Diagnosis not present

## 2019-08-02 DIAGNOSIS — M329 Systemic lupus erythematosus, unspecified: Secondary | ICD-10-CM | POA: Diagnosis present

## 2019-08-02 MED ORDER — GABAPENTIN 300 MG PO CAPS: 300.0000 mg | ORAL_CAPSULE | Freq: Three times a day (TID) | ORAL | 5 refills | Status: DC

## 2019-08-02 MED ORDER — GABAPENTIN 300 MG PO CAPS: 300.0000 mg | ORAL_CAPSULE | Freq: Four times a day (QID) | ORAL | 5 refills | Status: DC

## 2019-08-02 MED ORDER — TRAMADOL HCL 50 MG PO TABS: 50.0000 mg | ORAL_TABLET | Freq: Three times a day (TID) | ORAL | 1 refills | Status: DC | PRN

## 2019-08-02 MED ORDER — PREGABALIN 100 MG PO CAPS: 100.0000 mg | ORAL_CAPSULE | Freq: Two times a day (BID) | ORAL | 5 refills | Status: DC

## 2019-08-02 NOTE — Patient Instructions (Signed)
Pt is a 59 yr old with severe depression,allodynia,Lupus, fibromyalgia, and myofascial pain syndrome,and piriformis syndrome as well as bladder spasms and L4/5 disc issues with need for epidural - s/p epidural Mid January   1.  Needs refill of Tramadol #90- 1 RF  2. Lyrica- 100 mg BID for nerve pain- 5 refills  3. Gabapentin 300 mg QID- 1 in Am, 1 in afternoon, and 2 at bedtime for nerve pain- Printed so can take to whoever she wants  4. Theracane 2-4 minutes- on each muscle. Can also use tennis ball against back.  Muscle tightness doesn't respond well to medicines very often.    5. Had trigger point injections- still paying on them- 2 really hurt.  Wasn't really effective;    6. Magnesium - 400 mg 1-3x/day- can cause looser stools.  Pee out what you don't need.   7. F/U in 8 weeks
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
## 2019-08-18 ENCOUNTER — Other Ambulatory Visit: Payer: Self-pay

## 2019-08-18 DIAGNOSIS — E559 Vitamin D deficiency, unspecified: Secondary | ICD-10-CM

## 2019-08-18 DIAGNOSIS — Z79899 Other long term (current) drug therapy: Secondary | ICD-10-CM

## 2019-08-19 LAB — CBC WITH DIFFERENTIAL/PLATELET
Absolute Monocytes: 382 cells/uL (ref 200–950)
Basophils Absolute: 39 cells/uL (ref 0–200)
Basophils Relative: 1 %
Eosinophils Absolute: 20 cells/uL (ref 15–500)
Eosinophils Relative: 0.5 %
HCT: 36.7 % (ref 35.0–45.0)
Hemoglobin: 12.3 g/dL (ref 11.7–15.5)
Lymphs Abs: 757 cells/uL — ABNORMAL LOW (ref 850–3900)
MCH: 27.9 pg (ref 27.0–33.0)
MCHC: 33.5 g/dL (ref 32.0–36.0)
MCV: 83.2 fL (ref 80.0–100.0)
MPV: 10.6 fL (ref 7.5–12.5)
Monocytes Relative: 9.8 %
Neutro Abs: 2703 cells/uL (ref 1500–7800)
Neutrophils Relative %: 69.3 %
Platelets: 299 10*3/uL (ref 140–400)
RBC: 4.41 10*6/uL (ref 3.80–5.10)
RDW: 14.4 % (ref 11.0–15.0)
Total Lymphocyte: 19.4 %
WBC: 3.9 10*3/uL (ref 3.8–10.8)

## 2019-08-19 LAB — COMPLETE METABOLIC PANEL WITH GFR
AG Ratio: 1.5 (calc) (ref 1.0–2.5)
ALT: 23 U/L (ref 6–29)
AST: 23 U/L (ref 10–35)
Albumin: 4.1 g/dL (ref 3.6–5.1)
Alkaline phosphatase (APISO): 59 U/L (ref 37–153)
BUN/Creatinine Ratio: 6 (calc) (ref 6–22)
BUN: 6 mg/dL — ABNORMAL LOW (ref 7–25)
CO2: 26 mmol/L (ref 20–32)
Calcium: 9 mg/dL (ref 8.6–10.4)
Chloride: 97 mmol/L — ABNORMAL LOW (ref 98–110)
Creat: 0.93 mg/dL (ref 0.50–1.05)
GFR, Est African American: 78 mL/min/{1.73_m2} (ref >=60)
GFR, Est Non African American: 67 mL/min/{1.73_m2} (ref >=60)
Globulin: 2.8 g/dL (calc) (ref 1.9–3.7)
Glucose, Bld: 92 mg/dL (ref 65–99)
Potassium: 3.9 mmol/L (ref 3.5–5.3)
Sodium: 132 mmol/L — ABNORMAL LOW (ref 135–146)
Total Bilirubin: 0.5 mg/dL (ref 0.2–1.2)
Total Protein: 6.9 g/dL (ref 6.1–8.1)

## 2019-08-19 LAB — VITAMIN D 25 HYDROXY (VIT D DEFICIENCY, FRACTURES): Vit D, 25-Hydroxy: 50 ng/mL (ref 30–100)

## 2019-08-20 NOTE — Progress Notes (Signed)
Vitamin D is in normal range now.  Patient should take vitamin D 2000 units every day and repeat vitamin D in 6 months.  Sodium is low.  All other labs are unremarkable.  Please forward labs to her PCP.
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
## 2019-08-22 ENCOUNTER — Telehealth: Payer: Self-pay | Admitting: Orthopaedic Surgery

## 2019-08-22 NOTE — Telephone Encounter (Signed)
Received vm from [facility] w/ Starla Link checking status of request. IC,lm on general vm. Advised request processed by [facility] on 2/17. Left [facility] phone number for contact directly.

## 2019-08-28 ENCOUNTER — Telehealth: Payer: Self-pay | Admitting: Rheumatology

## 2019-08-28 ENCOUNTER — Ambulatory Visit: Payer: 59 | Admitting: Rheumatology

## 2019-08-28 ENCOUNTER — Other Ambulatory Visit: Payer: Self-pay

## 2019-08-28 ENCOUNTER — Encounter: Payer: Self-pay | Admitting: Rheumatology

## 2019-08-28 VITALS — BP 140/85 | HR 77 | Resp 16 | Ht 61.5 in | Wt 215.0 lb

## 2019-08-28 DIAGNOSIS — Z79899 Other long term (current) drug therapy: Secondary | ICD-10-CM

## 2019-08-28 DIAGNOSIS — G252 Other specified forms of tremor: Secondary | ICD-10-CM

## 2019-08-28 DIAGNOSIS — E559 Vitamin D deficiency, unspecified: Secondary | ICD-10-CM

## 2019-08-28 DIAGNOSIS — M5030 Other cervical disc degeneration, unspecified cervical region: Secondary | ICD-10-CM

## 2019-08-28 DIAGNOSIS — I10 Essential (primary) hypertension: Secondary | ICD-10-CM

## 2019-08-28 DIAGNOSIS — M19042 Primary osteoarthritis, left hand: Secondary | ICD-10-CM

## 2019-08-28 DIAGNOSIS — M797 Fibromyalgia: Secondary | ICD-10-CM | POA: Diagnosis not present

## 2019-08-28 DIAGNOSIS — K760 Fatty (change of) liver, not elsewhere classified: Secondary | ICD-10-CM

## 2019-08-28 DIAGNOSIS — M5136 Other intervertebral disc degeneration, lumbar region: Secondary | ICD-10-CM

## 2019-08-28 DIAGNOSIS — M19041 Primary osteoarthritis, right hand: Secondary | ICD-10-CM

## 2019-08-28 DIAGNOSIS — Z8269 Family history of other diseases of the musculoskeletal system and connective tissue: Secondary | ICD-10-CM

## 2019-08-28 DIAGNOSIS — R5383 Other fatigue: Secondary | ICD-10-CM

## 2019-08-28 DIAGNOSIS — M3219 Other organ or system involvement in systemic lupus erythematosus: Secondary | ICD-10-CM

## 2019-08-28 DIAGNOSIS — F5101 Primary insomnia: Secondary | ICD-10-CM | POA: Diagnosis not present

## 2019-08-28 DIAGNOSIS — K219 Gastro-esophageal reflux disease without esophagitis: Secondary | ICD-10-CM

## 2019-08-28 MED ORDER — HYDROXYCHLOROQUINE SULFATE 200 MG PO TABS: ORAL_TABLET | ORAL | 0 refills | Status: DC

## 2019-08-28 NOTE — Telephone Encounter (Signed)
Patient brought in a Medical form to be filled out, partly by Dr. Estanislado Pandy and partly by PT for [facility]. Patient paid $25.00 in cash. Form, and cash was sent to [facility] on 08/30/19.

## 2019-08-28 NOTE — Patient Instructions (Signed)
Standing Labs We placed an order today for your standing lab work.    Please come back and get your standing labs in July(get labs 2 weeks before your appointment discussed)  We have open lab daily Monday through Thursday from 8:30-12:30 PM and 1:30-4:30 PM and Friday from 8:30-12:30 PM and 1:30-4:00 PM at the office of Dr. Bo Merino.   You may experience shorter wait times on Monday and Friday afternoons. The office is located at [address], [place], [place], [place] [address] No appointment is necessary.   Labs are drawn by [facility].  You may receive a bill from [facility] for your lab work.  If you wish to have your labs drawn at another location, please call the office 24 hours in advance to send orders.  If you have any questions regarding directions or hours of operation,  please call [phone].   Just as a reminder please drink plenty of water prior to coming for your lab work. Thanks!

## 2019-08-29 ENCOUNTER — Telehealth: Payer: Self-pay | Admitting: *Deleted

## 2019-08-29 NOTE — Progress Notes (Signed)
UA shows nitrite, leukocytes and few bacteria.  If patient is symptomatic she should see her PCP for the treatment of UTI.

## 2019-08-29 NOTE — Telephone Encounter (Signed)
Pharmacy left a message asking for clarity on whether or not patient is to be taking lyrica and gabapentin concurrently or otherwise

## 2019-08-29 NOTE — Telephone Encounter (Signed)
Got call by pharmacy- called back- they were very upset pt is on low dose Gabapentin and low dose Lyrica.   I explained exactly- why pt was taking the medication and she continued to say she wasn't sure if they would give pt the Lyrica and Gabapentin. I explained reasoning and that I have multiple patient-s-   Female Pharmacist named Claiborne Billings-  on call at [name] at 3:34 pm at [facility] on 08/29/19.

## 2019-08-30 NOTE — Progress Notes (Signed)
Labs are stable.  Do not indicate a lupus flare.

## 2019-08-31 LAB — CARDIOLIPIN ANTIBODIES, IGG, IGM, IGA
Anticardiolipin IgA: <11 [APL'U]
Anticardiolipin IgG: <14 [GPL'U]
Anticardiolipin IgM: <12 [MPL'U]

## 2019-08-31 LAB — URINALYSIS, ROUTINE W REFLEX MICROSCOPIC
Bilirubin Urine: NEGATIVE
Glucose, UA: NEGATIVE
Hgb urine dipstick: NEGATIVE
Hyaline Cast: NONE SEEN /LPF
Ketones, ur: NEGATIVE
Nitrite: POSITIVE — AB
Protein, ur: NEGATIVE
RBC / HPF: NONE SEEN /HPF (ref 0–2)
Specific Gravity, Urine: 1.011 (ref 1.001–1.03)
pH: 6.5 (ref 5.0–8.0)

## 2019-08-31 LAB — BETA-2 GLYCOPROTEIN ANTIBODIES
Beta-2 Glyco 1 IgA: 19 SAU (ref <=20)
Beta-2 Glyco 1 IgM: 51 SMU — ABNORMAL HIGH (ref <=20)
Beta-2 Glyco I IgG: <9 SGU (ref <=20)

## 2019-08-31 LAB — ANTI-DNA ANTIBODY, DOUBLE-STRANDED: ds DNA Ab: 8 IU/mL — ABNORMAL HIGH

## 2019-08-31 LAB — C3 AND C4
C3 Complement: 124 mg/dL (ref 83–193)
C4 Complement: 16 mg/dL (ref 15–57)

## 2019-08-31 LAB — SEDIMENTATION RATE: Sed Rate: 6 mm/h (ref 0–30)

## 2019-09-08 ENCOUNTER — Ambulatory Visit: Payer: 59

## 2019-09-27 ENCOUNTER — Other Ambulatory Visit: Payer: Self-pay

## 2019-09-27 ENCOUNTER — Encounter: Payer: 59 | Attending: Physical Medicine and Rehabilitation | Admitting: Physical Medicine and Rehabilitation

## 2019-09-27 ENCOUNTER — Encounter: Payer: Self-pay | Admitting: Physical Medicine and Rehabilitation

## 2019-09-27 VITALS — BP 151/83 | HR 74 | Temp 97.7°F | Ht 61.2 in | Wt 218.6 lb

## 2019-09-27 DIAGNOSIS — G894 Chronic pain syndrome: Secondary | ICD-10-CM | POA: Diagnosis present

## 2019-09-27 DIAGNOSIS — Z5181 Encounter for therapeutic drug level monitoring: Secondary | ICD-10-CM

## 2019-09-27 DIAGNOSIS — M5126 Other intervertebral disc displacement, lumbar region: Secondary | ICD-10-CM | POA: Diagnosis not present

## 2019-09-27 DIAGNOSIS — M329 Systemic lupus erythematosus, unspecified: Secondary | ICD-10-CM | POA: Insufficient documentation

## 2019-09-27 DIAGNOSIS — M7918 Myalgia, other site: Secondary | ICD-10-CM

## 2019-09-27 DIAGNOSIS — N3289 Other specified disorders of bladder: Secondary | ICD-10-CM

## 2019-09-27 DIAGNOSIS — M5030 Other cervical disc degeneration, unspecified cervical region: Secondary | ICD-10-CM

## 2019-09-27 DIAGNOSIS — M797 Fibromyalgia: Secondary | ICD-10-CM

## 2019-09-27 DIAGNOSIS — Z79891 Long term (current) use of opiate analgesic: Secondary | ICD-10-CM | POA: Diagnosis present

## 2019-09-27 DIAGNOSIS — R208 Other disturbances of skin sensation: Secondary | ICD-10-CM | POA: Insufficient documentation

## 2019-09-27 NOTE — Patient Instructions (Signed)
Pt is a 59 yr old with severe depression,allodynia,Lupus, fibromyalgia, and myofascial pain syndrome,and piriformis syndromeas well as bladder spasms and L4/5 disc issues with need for epidural- s/p epidural Mid January   1. Check TSH and free T4 to make sure doesn't have thyroid issues considering tight muscles, and fatigue- will check labs today.   2. Explained Gabapentin and Lyrica can cause increased weight gain. Don't want to increase this time- due to concern with pharmacist who won't help.   3. Isn't taking tramadol often- Ok to take- esp a few times/week.   4. Suggest Voltaren gel up to 4x/day. Is over the counter- usually helpful for hands.   5. F/U in 8 weeks
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
## 2019-09-28 LAB — T4, FREE: Free T4: 0.92 ng/dL (ref 0.82–1.77)

## 2019-09-28 LAB — TSH: TSH: 1.16 u[IU]/mL (ref 0.450–4.500)

## 2019-10-01 LAB — DRUG TOX MONITOR 1 W/CONF, ORAL FLD

## 2019-10-01 LAB — DRUG TOX ALC METAB W/CON, ORAL FLD: Alcohol Metabolite: NEGATIVE ng/mL (ref <25)

## 2019-10-02 ENCOUNTER — Telehealth: Payer: Self-pay

## 2019-10-02 NOTE — Telephone Encounter (Signed)
UDS RESULTS CONSISTENT WITH MEDIACATIONS ON FILE - no RX in screening-- Per your office note has [phone]-- not taken in over a week refilled [id number] # [phone]

## 2019-10-05 ENCOUNTER — Telehealth: Payer: Self-pay

## 2019-10-05 NOTE — Telephone Encounter (Signed)
Spoke with patient in regards to the medical source paperwork for disability. Patient states she cancelled the functional capacity evaluation because her lawyer advised she did not need that portion completed. Per patient, her lawyer advised that Dr. Estanislado Pandy complete which sections she can on the form and fax it back in. Dr. Estanislado Pandy will review the paperwork and we will send it back to [employer].

## 2019-10-17 ENCOUNTER — Ambulatory Visit (INDEPENDENT_AMBULATORY_CARE_PROVIDER_SITE_OTHER): Payer: 59 | Admitting: Orthopaedic Surgery

## 2019-10-17 ENCOUNTER — Encounter: Payer: Self-pay | Admitting: Orthopaedic Surgery

## 2019-10-17 ENCOUNTER — Other Ambulatory Visit: Payer: Self-pay

## 2019-10-17 VITALS — BP 143/77 | HR 81 | Ht 61.5 in | Wt 213.0 lb

## 2019-10-17 DIAGNOSIS — M5126 Other intervertebral disc displacement, lumbar region: Secondary | ICD-10-CM | POA: Diagnosis not present

## 2019-10-17 NOTE — Progress Notes (Signed)
Office Visit Note   Patient: Terri Hurley           Date of Birth: [birth year]           MRN: [id number] Visit Date: 10/17/2019              Requested by: [name], MD [place],  [place] [address] PCP: [name], MD   Assessment & Plan: Visit Diagnoses:  1. Protrusion of lumbar intervertebral disc     Plan: Patient's MRI in October 2020 showed central disc protrusion L5-S1 with small sequestered fragment inferior at L5-S1 to the left without significant compression.  Foramina were widely patent and no central canal stenosis.  She has gotten some improvement with medications including Lyrica and gabapentin and occasional tramadol.  She continue her medication for depression and states this is actually responding some treatment as well.  At this point no operative intervention is considered and we can follow her up on a as needed basis.  Follow-Up Instructions: Return if symptoms worsen or fail to improve.   Orders:  No orders of the defined types were placed in this encounter.  No orders of the defined types were placed in this encounter.     Procedures: No procedures performed   Clinical Data: No additional findings.   Subjective: Chief Complaint  Patient presents with  . Lower Back - Pain, Follow-up    HPI 59 year old female returns with ongoing problems with back pain.  Patient previously worked as a Theme park manager.  She states she continues to have back pain with pain that is located lumbosacral junction occasionally radiates into her buttocks but not down to her feet.  She has had problems with significant depression is on multiple depression medications and also has a diagnosis of fibromyalgia.  She is taking both gabapentin and Lyrica and also tramadol.  She has had some associated weight gain with the medication but states there is helping with her symptoms.  She states at times she gets short periods where she is having significant improvement  in her pain and then she has some recurrence.  Review of Systems review of systems positive for depression under treatment and improved.  Previous cervical fusion.  Lumbar disc degeneration history of lupus, hypertension, diagnosis of fibromyalgia.  Otherwise negative as it pertains HPI.   Objective: Vital Signs: BP (!) 143/77   Pulse 81   Ht 5' 1.5" (1.562 m)   Wt 213 lb (96.6 kg)   BMI 39.59 kg/m   Physical Exam Constitutional:      Appearance: She is well-developed.  HENT:     Head: Normocephalic.     Right Ear: External ear normal.     Left Ear: External ear normal.  Eyes:     Pupils: Pupils are equal, round, and reactive to light.  Neck:     Thyroid: No thyromegaly.     Trachea: No tracheal deviation.  Cardiovascular:     Rate and Rhythm: Normal rate.  Pulmonary:     Effort: Pulmonary effort is normal.  Abdominal:     Palpations: Abdomen is soft.  Skin:    General: Skin is warm and dry.  Neurological:     Mental Status: She is alert and oriented to person, place, and time.  Psychiatric:        Behavior: Behavior normal.     Ortho Exam in the sitting position patient has a tremor of her left leg.  She is able get from  sitting to standing and can walk heel toe gait.  Increased truncal obesity with now BMI 39.  No rash over exposed skin.  Some tenderness in the lumbosacral junction no sciatic notch tenderness trochanteric bursa is nontender.  Specialty Comments:  No specialty comments available.  Imaging: No results found.   PMFS History: Patient Active Problem List   Diagnosis Date Noted  . Chronic pain syndrome 09/27/2019  . Encounter for therapeutic drug monitoring 09/27/2019  . Encounter for monitoring opioid maintenance therapy 09/27/2019  . Bladder spasms 06/21/2019  . Protrusion of lumbar intervertebral disc 05/16/2019  . Allodynia 03/23/2019  . Fibromyalgia 03/23/2019  . Myofascial pain dysfunction syndrome 03/23/2019  . SLE (systemic lupus  erythematosus) ([facility])   . Primary osteoarthritis of both hands 10/01/2017  . DDD (degenerative disc disease), cervical 08/23/2017  . DDD (degenerative disc disease), lumbar 08/23/2017  . Essential hypertension 01/28/2016  . Incontinence of urine 12/26/2010  . Depression   . GERD (gastroesophageal reflux disease)   . Fatty liver disease, nonalcoholic   . HNP (herniated nucleus pulposus), cervical   . Recurrent genital HSV (herpes simplex virus) infection   . Plantar fasciitis   . Elevated liver enzymes 12/12/2010  . Vitamin D deficiency 12/12/2010   Past Medical History:  Diagnosis Date  . Arthritis    low back, hip, hands  . Depression    Sees [name], NP @ [facility]  counseling center.Marland Kitchen Has h/o hospitalization  . Fatty liver disease, nonalcoholic    clinical diagnosis by endocrinologist  . Fibromyalgia   . GERD (gastroesophageal reflux disease)    controlled with PPI therapy  . Hepatitis unknown type   . High cholesterol    No medical therapy. Last lab March '12  LDL 91, T. Chol 170. Minimal elevation in '08  . HNP (herniated nucleus pulposus), cervical    C6-7. Has had PT, no surgery  . Plantar fasciitis   . Recurrent genital HSV (herpes simplex virus) infection    on daily suppression with acyclovir  . SLE (systemic lupus erythematosus) (HCC)    sees rheum, on plaquenil  . Urinary incontinence   . Vitamin D deficiency    lab '09  Vit D = 19    Family History  Problem Relation Age of Onset  . Heart disease Mother 15       AMI  . Diabetes Mother        peripheral neuropathy  . Hypertension Mother   . Hyperlipidemia Mother   . Mental illness Mother        depression and anxiety  . Gout Mother   . Diabetes Father   . Heart disease Father   . Hyperlipidemia Father   . Mental illness Father        depression and anxiety  . Heart disease Brother        arrythmia, DCC  . Hyperlipidemia Brother   . Mental illness Brother   . Cancer Sister 16       breast  cancer  . Mental illness Sister   . Asthma Sister   . Thyroid disease Sister   . COPD Sister   . Mental illness Sister   . COPD Brother   . Stroke Brother   . Hyperlipidemia Brother   . Hypertension Brother   . Mental illness Brother   . Mental illness Sister   . Mental illness Brother   . Mental illness Brother   . GER disease Son     Past Surgical  History:  Procedure Laterality Date  . CHOLECYSTECTOMY    . INCONTINENCE SURGERY  2009  . SPINE SURGERY  05/2014   Cervical spine surgery C4-5, C5-6.  [place].  Marland Kitchen TOTAL ABDOMINAL HYSTERECTOMY W/ BILATERAL SALPINGOOPHORECTOMY  2009   with repair of cystocele and rectocele    Social History   Occupational History  . Occupation: HAIR DRESSER  Tobacco Use  . Smoking status: Never Smoker  . Smokeless tobacco: Never Used  Substance and Sexual Activity  . Alcohol use: No  . Drug use: No  . Sexual activity: Never    Birth control/protection: Surgical, Post-menopausal

## 2019-10-23 NOTE — Progress Notes (Signed)
Terri Hurley - 59 y.o. female MRN [id number]  Date of birth: [birth year]  Office Visit Note: Visit Date: 07/06/2019 PCP: [name], MD Referred by: [name], MD  Subjective: Chief Complaint  Patient presents with  . Lower Back - Pain   HPI:  Terri Hurley is a 59 y.o. female who comes in today At the request of Rodell Perna, MD for planned Bilateral S1-2 lumbar transforaminal epidural steroid injection with fluoroscopic guidance.  The patient has failed conservative care including home exercise, medications, time and activity modification.  This injection will be diagnostic and hopefully therapeutic.  Please see requesting physician notes for further details and justification.  MRI reviewed and reviewed below.  ROS Otherwise per HPI.  Assessment & Plan: Visit Diagnoses:  1. Radiculopathy due to lumbar intervertebral disc disorder     Plan: No additional findings.   Meds & Orders:  Meds ordered this encounter  Medications  . methylPREDNISolone acetate (DEPO-MEDROL) injection 40 mg    Orders Placed This Encounter  Procedures  . XR C-ARM NO REPORT  . Epidural Steroid injection    Follow-up: No follow-ups on file.   Procedures: No procedures performed  S1 Lumbosacral Transforaminal Epidural Steroid Injection - Sub-Pedicular Approach with Fluoroscopic Guidance   Patient: Terri Hurley      Date of Birth: Jan 09, 1961 MRN: [id number] PCP: [name], MD      Visit Date: 07/06/2019   Universal Protocol:    Date/Time: 05/03/216:22 AM  Consent Given By: the patient  Position:  PRONE  Additional Comments: Vital signs were monitored before and after the procedure. Patient was prepped and draped in the usual sterile fashion. The correct patient, procedure, and site was verified.   Injection Procedure Details:  Procedure Site One Meds Administered:  Meds ordered this encounter  Medications  . methylPREDNISolone acetate (DEPO-MEDROL) injection 40 mg     Laterality: Bilateral  Location/Site:  S1 Foramen   Needle size: 22 ga.  Needle type: Spinal  Needle Placement: Transforaminal  Findings:   -Comments: Excellent flow of contrast along the pedicle and nerve root and epidural on the right with some difficulty of placement on the left but ultimately with repositioning did get good flow of contrast epidurally and across the pedicle.  Epidurogram: Contrast epidurogram showed no nerve root cut off or restricted flow pattern.  Procedure Details: After squaring off the sacral end-plate to get a true AP view, the C-arm was positioned so that the best possible view of the S1 foramen was visualized. The soft tissues overlying this structure were infiltrated with 2-3 ml. of 1% Lidocaine without Epinephrine.    The spinal needle was inserted toward the target using a "trajectory" view along the fluoroscope beam.  Under AP and lateral visualization, the needle was advanced so it did not puncture dura. Biplanar projections were used to confirm position. Aspiration was confirmed to be negative for CSF and/or blood. A 1-2 ml. volume of Isovue-250 was injected and flow of contrast was noted at each level. Radiographs were obtained for documentation purposes.   After attaining the desired flow of contrast documented above, a 0.5 to 1.0 ml test dose of 0.25% Marcaine was injected into each respective transforaminal space.  The patient was observed for 90 seconds post injection.  After no sensory deficits were reported, and normal lower extremity motor function was noted,   the above injectate was administered so that equal amounts of the injectate were placed at each foramen (level) into the  transforaminal epidural space.   Additional Comments:  The patient tolerated the procedure well Dressing: Band-Aid with 2 x 2 sterile gauze    Post-procedure details: Patient was observed during the procedure. Post-procedure instructions were  reviewed.  Patient left the clinic in stable condition.     Clinical History: MRI LUMBAR SPINE WITHOUT CONTRAST  TECHNIQUE: Multiplanar, multisequence MR imaging of the lumbar spine was performed. No intravenous contrast was administered.  COMPARISON:  Two views lumbar spine 01/10/2019. MRI lumbar spine 11/05/2011.  FINDINGS: Segmentation:  Standard.  Alignment: No listhesis. Convex right scoliosis with the apex at approximately L3-4.  Vertebrae:  No fracture, evidence of discitis, or bone lesion.  Conus medullaris and cauda equina: Conus extends to the L1 level. Conus and cauda equina appear normal.  Paraspinal and other soft tissues: Negative.  Disc levels:  T11-12: Negative.  T12-L1: Negative.  L1-2: Shallow disc bulge without stenosis.  L2-3: Shallow disc bulge without stenosis.  L3-4: Shallow disc bulge without stenosis.  L4-5: Shallow disc bulge and mild facet degenerative disease. No stenosis.  L5-S1: The patient has a new central disc protrusion indenting the ventral thecal sac. A sequestered disc fragment measuring 0.9 cm craniocaudal x 0.7 cm AP x 0.4 cm transverse is seen in the left subarticular recess inferior to the disc interspace. The fragment contacts the left S1 root without compression or displacement. The foramina are widely patent.  IMPRESSION: New central disc protrusion at L5-S1 indents the ventral thecal sac but the central canal appears open. There is also a new sequestered disc fragment inferior to the L5-S1 disc interspace in the left subarticular recess. The fragment contacts the left S1 root without compression or displacement.  Mild disc bulging L1-2 to L4-5 without stenosis.   Electronically Signed   By: Inge Rise M.D.   On: 03/29/2019 15:03     Objective:  VS:  HT:    WT:   BMI:     BP:123/78  HR:70bpm  TEMP: ( )  RESP:  Physical Exam  Ortho Exam Imaging: No results found.

## 2019-10-23 NOTE — Procedures (Signed)
S1 Lumbosacral Transforaminal Epidural Steroid Injection - Sub-Pedicular Approach with Fluoroscopic Guidance   Patient: Terri Hurley      Date of Birth: Sep 24, 1960 MRN: [id number] PCP: [name], MD      Visit Date: 07/06/2019   Universal Protocol:    Date/Time: 05/03/216:22 AM  Consent Given By: the patient  Position:  PRONE  Additional Comments: Vital signs were monitored before and after the procedure. Patient was prepped and draped in the usual sterile fashion. The correct patient, procedure, and site was verified.   Injection Procedure Details:  Procedure Site One Meds Administered:  Meds ordered this encounter  Medications  . methylPREDNISolone acetate (DEPO-MEDROL) injection 40 mg    Laterality: Bilateral  Location/Site:  S1 Foramen   Needle size: 22 ga.  Needle type: Spinal  Needle Placement: Transforaminal  Findings:   -Comments: Excellent flow of contrast along the pedicle and nerve root and epidural on the right with some difficulty of placement on the left but ultimately with repositioning did get good flow of contrast epidurally and across the pedicle.  Epidurogram: Contrast epidurogram showed no nerve root cut off or restricted flow pattern.  Procedure Details: After squaring off the sacral end-plate to get a true AP view, the C-arm was positioned so that the best possible view of the S1 foramen was visualized. The soft tissues overlying this structure were infiltrated with 2-3 ml. of 1% Lidocaine without Epinephrine.    The spinal needle was inserted toward the target using a "trajectory" view along the fluoroscope beam.  Under AP and lateral visualization, the needle was advanced so it did not puncture dura. Biplanar projections were used to confirm position. Aspiration was confirmed to be negative for CSF and/or blood. A 1-2 ml. volume of Isovue-250 was injected and flow of contrast was noted at each level. Radiographs were obtained for  documentation purposes.   After attaining the desired flow of contrast documented above, a 0.5 to 1.0 ml test dose of 0.25% Marcaine was injected into each respective transforaminal space.  The patient was observed for 90 seconds post injection.  After no sensory deficits were reported, and normal lower extremity motor function was noted,   the above injectate was administered so that equal amounts of the injectate were placed at each foramen (level) into the transforaminal epidural space.   Additional Comments:  The patient tolerated the procedure well Dressing: Band-Aid with 2 x 2 sterile gauze    Post-procedure details: Patient was observed during the procedure. Post-procedure instructions were reviewed.  Patient left the clinic in stable condition.

## 2019-10-27 ENCOUNTER — Other Ambulatory Visit: Payer: Self-pay | Admitting: Physical Medicine and Rehabilitation

## 2019-11-02 ENCOUNTER — Other Ambulatory Visit: Payer: Self-pay | Admitting: Family Medicine

## 2019-11-02 DIAGNOSIS — Z1231 Encounter for screening mammogram for malignant neoplasm of breast: Secondary | ICD-10-CM

## 2019-11-03 ENCOUNTER — Other Ambulatory Visit: Payer: Self-pay

## 2019-11-03 ENCOUNTER — Ambulatory Visit
Admission: RE | Admit: 2019-11-03 | Discharge: 2019-11-03 | Disposition: A | Discharge status: Home / Self Care | Payer: 59 | Source: Ambulatory Visit | Attending: Family Medicine | Admitting: Family Medicine

## 2019-11-03 DIAGNOSIS — Z1231 Encounter for screening mammogram for malignant neoplasm of breast: Secondary | ICD-10-CM

## 2019-11-10 ENCOUNTER — Other Ambulatory Visit: Payer: Self-pay | Admitting: Rheumatology

## 2019-11-10 DIAGNOSIS — M3219 Other organ or system involvement in systemic lupus erythematosus: Secondary | ICD-10-CM

## 2019-11-10 NOTE — Telephone Encounter (Signed)
Last Visit: 08/28/2019  Next Visit: 01/31/2020 Labs: 08/18/2019 Sodium is low. All other labs are unremarkable. Eye exam: 07/28/2019   Okay to refill per Dr. Estanislado Pandy.

## 2019-11-22 ENCOUNTER — Other Ambulatory Visit: Payer: Self-pay

## 2019-11-22 ENCOUNTER — Encounter: Payer: 59 | Attending: Physical Medicine and Rehabilitation | Admitting: Physical Medicine and Rehabilitation

## 2019-11-22 ENCOUNTER — Encounter: Payer: Self-pay | Admitting: Physical Medicine and Rehabilitation

## 2019-11-22 VITALS — BP 137/86 | HR 75 | Temp 97.5°F | Ht 61.5 in | Wt 222.0 lb

## 2019-11-22 DIAGNOSIS — M329 Systemic lupus erythematosus, unspecified: Secondary | ICD-10-CM | POA: Insufficient documentation

## 2019-11-22 DIAGNOSIS — Z5181 Encounter for therapeutic drug level monitoring: Secondary | ICD-10-CM | POA: Diagnosis present

## 2019-11-22 DIAGNOSIS — G894 Chronic pain syndrome: Secondary | ICD-10-CM | POA: Diagnosis present

## 2019-11-22 DIAGNOSIS — R208 Other disturbances of skin sensation: Secondary | ICD-10-CM | POA: Diagnosis present

## 2019-11-22 DIAGNOSIS — Z79891 Long term (current) use of opiate analgesic: Secondary | ICD-10-CM | POA: Diagnosis present

## 2019-11-22 DIAGNOSIS — M7918 Myalgia, other site: Secondary | ICD-10-CM | POA: Insufficient documentation

## 2019-11-22 DIAGNOSIS — M797 Fibromyalgia: Secondary | ICD-10-CM | POA: Diagnosis present

## 2019-11-22 DIAGNOSIS — M5136 Other intervertebral disc degeneration, lumbar region: Secondary | ICD-10-CM

## 2019-11-22 NOTE — Progress Notes (Signed)
  HPI   Pt is a 59 yr old with severe depression,allodynia,Lupus, fibromyalgia, and myofascial pain syndrome,and piriformis syndromeas well as bladder spasms and L4/5 disc issues with need for epidural- s/p epidural Mid January.   Not sure if doing any better.    Vit D last was 50 3 months ago; and TSH 1.16 and free T4 0.92 So not the cause of of pain issues.   Discussed using Lyrica/gabapentin-  Doesn't hurt as bad as when first saw her.   Still does hurts- not a day goes by that doesn't hurt.  Explained will hurt some daily.  Is getting sharp stabbing pain in LUE- in L tricep- hurting so bad, started to hit it to make it better.   Gets them in his feet, legs, and arms- some days only a couple of times-  Some times " constantly" having stabbing pain.   More in limbs than in torso.   Cold- feet Last 2 weeks- feet feel like they are on ice- will put socks on-  Can't reach feet to tell if they are actually cold.  If puts socks on, feels better.   Hands also get cold some- but not as bad as feet.  R>L hands causing pain and cold  House kept cold due to being hot usually.   Getting bigger and really upset about it- gaining weight.   When goes to exercise, back starts hurting and legs- and stopped exercising when hurts.   Has recumbent bike.  Used to work out 60 minutes/day- but PCP told her to do 20 minutes 2-3x/day instead.   Has got to 220 lbs- hit max weight.  Having difficulty putting on socks and wiping butt.  Feels more restricted- due to weight.  Hates taking tramadol-   Vraylar- makes her really constipated- thinks will stop it.   Has taken at night- thinks making her restless leg syndrome- legs keeps moving.  Hasn't called Psychiatrist- wants to stop Vraylar-    1. Chair yoga- [employer] are free.   2. Walking vs recumbent bike- suggest starting- 15 minutes- and work up by 5-10 minutes every 3-4 days  3. 5 DAYS/week HAVE to do  some exercise.  Within  30 days, esearch is clear that pt's pain gets a little better.   4. It's OK to take tramadol for pain if hurting, esp due to exercise.   5. Talk to psychiatry about Vraylar medications.   6. TSH/free T4 are OK- no meds for this.   7. Needs to get Rheumatology to refill Plaquenil when due.   8. Thinks cold feet and hands- is Reynaud's syndrome.  - is common with autoimmune disorders.   30. Was feeling overwhelmed- but feeling better.   10.F/U in 8 weeks    I spent a total of 35 minutes on appointment- as detailed above.
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
## 2019-11-22 NOTE — Patient Instructions (Signed)
1. Chair yoga- [employer] are free.   2. Walking vs recumbent bike- suggest starting- 15 minutes- and work up by 5-10 minutes every 3-4 days  3. 5 DAYS/week HAVE to do some exercise.  Within  30 days, esearch is clear that pt's pain gets a little better.   4. It's OK to take tramadol for pain if hurting, esp due to exercise.   5. Talk to psychiatry about Vraylar medications.   6. TSH/free T4 are OK- no meds for this.   7. Needs to get Rheumatology to refill Plaquenil when due.   8. Thinks cold feet and hands- is Reynaud's syndrome.  - is common with autoimmune disorders.   31. Was feeling overwhelmed- but feeling better.   10. F/U in 8 weeks

## 2019-11-29 ENCOUNTER — Other Ambulatory Visit: Payer: Self-pay | Admitting: Physical Medicine and Rehabilitation

## 2019-11-29 NOTE — Telephone Encounter (Signed)
Refill request for Tramadol 

## 2019-12-07 ENCOUNTER — Other Ambulatory Visit: Payer: Self-pay | Admitting: Physical Medicine and Rehabilitation

## 2019-12-07 MED ORDER — TRAMADOL HCL 50 MG PO TABS: 50.0000 mg | ORAL_TABLET | Freq: Three times a day (TID) | ORAL | 5 refills | Status: DC | PRN

## 2019-12-27 ENCOUNTER — Telehealth: Payer: Self-pay | Admitting: *Deleted

## 2019-12-27 NOTE — Telephone Encounter (Signed)
Prior Authorization approved for tramadol 12/26/2019 - 03/25/2020
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
## 2020-01-04 ENCOUNTER — Other Ambulatory Visit: Payer: Self-pay | Admitting: *Deleted

## 2020-01-04 DIAGNOSIS — Z79899 Other long term (current) drug therapy: Secondary | ICD-10-CM

## 2020-01-05 LAB — CBC WITH DIFFERENTIAL/PLATELET
Absolute Monocytes: 357 cells/uL (ref 200–950)
Basophils Absolute: 41 cells/uL (ref 0–200)
Basophils Relative: 1 %
Eosinophils Absolute: 41 cells/uL (ref 15–500)
Eosinophils Relative: 1 %
HCT: 36.9 % (ref 35.0–45.0)
Hemoglobin: 11.9 g/dL (ref 11.7–15.5)
Lymphs Abs: 1005 cells/uL (ref 850–3900)
MCH: 27.4 pg (ref 27.0–33.0)
MCHC: 32.2 g/dL (ref 32.0–36.0)
MCV: 85 fL (ref 80.0–100.0)
MPV: 10.5 fL (ref 7.5–12.5)
Monocytes Relative: 8.7 %
Neutro Abs: 2657 cells/uL (ref 1500–7800)
Neutrophils Relative %: 64.8 %
Platelets: 276 10*3/uL (ref 140–400)
RBC: 4.34 10*6/uL (ref 3.80–5.10)
RDW: 14.1 % (ref 11.0–15.0)
Total Lymphocyte: 24.5 %
WBC: 4.1 10*3/uL (ref 3.8–10.8)

## 2020-01-05 LAB — COMPLETE METABOLIC PANEL WITH GFR
AG Ratio: 1.5 (calc) (ref 1.0–2.5)
ALT: 27 U/L (ref 6–29)
AST: 22 U/L (ref 10–35)
Albumin: 3.9 g/dL (ref 3.6–5.1)
Alkaline phosphatase (APISO): 58 U/L (ref 37–153)
BUN/Creatinine Ratio: 7 (calc) (ref 6–22)
BUN: 8 mg/dL (ref 7–25)
CO2: 26 mmol/L (ref 20–32)
Calcium: 9.1 mg/dL (ref 8.6–10.4)
Chloride: 99 mmol/L (ref 98–110)
Creat: 1.1 mg/dL — ABNORMAL HIGH (ref 0.50–1.05)
GFR, Est African American: 64 mL/min/{1.73_m2} (ref >=60)
GFR, Est Non African American: 55 mL/min/{1.73_m2} — ABNORMAL LOW (ref >=60)
Globulin: 2.6 g/dL (calc) (ref 1.9–3.7)
Glucose, Bld: 98 mg/dL (ref 65–99)
Potassium: 4.3 mmol/L (ref 3.5–5.3)
Sodium: 134 mmol/L — ABNORMAL LOW (ref 135–146)
Total Bilirubin: 0.4 mg/dL (ref 0.2–1.2)
Total Protein: 6.5 g/dL (ref 6.1–8.1)

## 2020-01-05 NOTE — Progress Notes (Signed)
Creatinine is elevated.  Most likely due to the use of HCTZ.  Please forward results to her PCP.

## 2020-01-11 ENCOUNTER — Other Ambulatory Visit: Payer: Self-pay

## 2020-01-11 ENCOUNTER — Ambulatory Visit (INDEPENDENT_AMBULATORY_CARE_PROVIDER_SITE_OTHER): Payer: 59 | Admitting: Rheumatology

## 2020-01-11 ENCOUNTER — Encounter: Payer: Self-pay | Admitting: Rheumatology

## 2020-01-11 VITALS — BP 149/80 | HR 72 | Resp 18 | Ht 61.5 in | Wt 222.2 lb

## 2020-01-11 DIAGNOSIS — Z8269 Family history of other diseases of the musculoskeletal system and connective tissue: Secondary | ICD-10-CM

## 2020-01-11 DIAGNOSIS — M3219 Other organ or system involvement in systemic lupus erythematosus: Secondary | ICD-10-CM | POA: Diagnosis not present

## 2020-01-11 DIAGNOSIS — M797 Fibromyalgia: Secondary | ICD-10-CM

## 2020-01-11 DIAGNOSIS — K219 Gastro-esophageal reflux disease without esophagitis: Secondary | ICD-10-CM

## 2020-01-11 DIAGNOSIS — M5136 Other intervertebral disc degeneration, lumbar region: Secondary | ICD-10-CM

## 2020-01-11 DIAGNOSIS — K760 Fatty (change of) liver, not elsewhere classified: Secondary | ICD-10-CM

## 2020-01-11 DIAGNOSIS — E559 Vitamin D deficiency, unspecified: Secondary | ICD-10-CM

## 2020-01-11 DIAGNOSIS — F5101 Primary insomnia: Secondary | ICD-10-CM

## 2020-01-11 DIAGNOSIS — M5030 Other cervical disc degeneration, unspecified cervical region: Secondary | ICD-10-CM

## 2020-01-11 DIAGNOSIS — Z79899 Other long term (current) drug therapy: Secondary | ICD-10-CM | POA: Diagnosis not present

## 2020-01-11 DIAGNOSIS — I10 Essential (primary) hypertension: Secondary | ICD-10-CM

## 2020-01-11 DIAGNOSIS — G252 Other specified forms of tremor: Secondary | ICD-10-CM

## 2020-01-11 DIAGNOSIS — M19042 Primary osteoarthritis, left hand: Secondary | ICD-10-CM

## 2020-01-11 DIAGNOSIS — R5383 Other fatigue: Secondary | ICD-10-CM

## 2020-01-11 DIAGNOSIS — M19041 Primary osteoarthritis, right hand: Secondary | ICD-10-CM

## 2020-01-11 NOTE — Patient Instructions (Signed)
Vaccines You are taking a medication(s) that can suppress your immune system.  The following immunizations are recommended: . Flu annually . Covid-19  o Please continue to wear your mask if you are on any medications that suppress your immune system o If you are on methotrexate, Cellcept (mycophenolate), Rinvoq, Morrie Sheldon, and Olumiant - Hold medication for 1 week after each vaccine dose o If you are on Orencia Subcutaneous injections - Hold medication both one week prior to and one week after the first vaccine dose (only) o If you are on Orencia IV infusions - Time vaccine administration so that the first vaccination will occur four weeks after infusion . Pneumonia (Pneumovax 23 and Prevnar 13 spaced at least 1 year apart) . Shingrix (after age 56)  Please check with your PCP to make sure you are up to date.

## 2020-01-17 ENCOUNTER — Telehealth: Payer: Self-pay

## 2020-01-17 ENCOUNTER — Other Ambulatory Visit: Payer: Self-pay

## 2020-01-17 ENCOUNTER — Encounter: Payer: 59 | Attending: Physical Medicine and Rehabilitation | Admitting: Physical Medicine and Rehabilitation

## 2020-01-17 ENCOUNTER — Encounter: Payer: Self-pay | Admitting: Physical Medicine and Rehabilitation

## 2020-01-17 VITALS — BP 129/79 | HR 78 | Temp 98.4°F | Ht 61.5 in | Wt 220.0 lb

## 2020-01-17 DIAGNOSIS — M7918 Myalgia, other site: Secondary | ICD-10-CM | POA: Diagnosis not present

## 2020-01-17 DIAGNOSIS — R208 Other disturbances of skin sensation: Secondary | ICD-10-CM | POA: Diagnosis not present

## 2020-01-17 DIAGNOSIS — Z5181 Encounter for therapeutic drug level monitoring: Secondary | ICD-10-CM | POA: Insufficient documentation

## 2020-01-17 DIAGNOSIS — Z79891 Long term (current) use of opiate analgesic: Secondary | ICD-10-CM | POA: Diagnosis present

## 2020-01-17 DIAGNOSIS — M329 Systemic lupus erythematosus, unspecified: Secondary | ICD-10-CM | POA: Diagnosis present

## 2020-01-17 DIAGNOSIS — M797 Fibromyalgia: Secondary | ICD-10-CM | POA: Insufficient documentation

## 2020-01-17 DIAGNOSIS — F332 Major depressive disorder, recurrent severe without psychotic features: Secondary | ICD-10-CM | POA: Insufficient documentation

## 2020-01-17 DIAGNOSIS — G894 Chronic pain syndrome: Secondary | ICD-10-CM | POA: Diagnosis not present

## 2020-01-17 MED ORDER — GABAPENTIN 300 MG PO CAPS: 300.0000 mg | ORAL_CAPSULE | Freq: Four times a day (QID) | ORAL | 3 refills | Status: DC

## 2020-01-17 MED ORDER — GABAPENTIN 300 MG PO CAPS: 300.0000 mg | ORAL_CAPSULE | Freq: Four times a day (QID) | ORAL | 5 refills | Status: DC

## 2020-01-17 MED ORDER — PREGABALIN 50 MG PO CAPS
50.0000 mg | ORAL_CAPSULE | Freq: Three times a day (TID) | ORAL | 5 refills | Status: DC
Start: 2020-01-17 — End: 2020-01-17

## 2020-01-17 MED ORDER — PREGABALIN 100 MG PO CAPS: ORAL_CAPSULE | ORAL | 2 refills | Status: DC

## 2020-01-17 NOTE — Telephone Encounter (Signed)
[name] Pharmacist would like to confirm Lyrica dose. Because they received two prescriptions.   Please advise.  Thank you.

## 2020-01-17 NOTE — Patient Instructions (Signed)
1. Will call  [name]- the NP- works as pt's psychiatry provider. Due to concerns about medications.   2.  I am happy to fill out any more forms for disability.   3. Her Psychiatrist NP refuses to write antidepressants if taking tramadol- will call her and see what other options we can agree. Will discuss other opiates for pt. Will call in something this week.     4. Refill Lyrica 100 mg 2/xday  And Gabapentin 300 mg 4x/day-for nerve pain- can take at same time as gabapentin- both are lower doses of medications- at higher doses of 1, pt has too many side effects.  5. F/U-   6-8 weeks- try to call bank to discuss the house/car- and put brother on speaker phone.

## 2020-01-17 NOTE — Telephone Encounter (Signed)
Only 100 mg dose- I cancelled 50 mg dose
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
## 2020-01-18 ENCOUNTER — Telehealth: Payer: Self-pay | Admitting: *Deleted

## 2020-01-18 NOTE — Telephone Encounter (Signed)
Pharmacist had concerns about lyric rx sent yesterday. He was asking if she is to be on the 100 mg pregabalin or the 50 mg. I have clarified it is to be the 100 mg pregablin.

## 2020-01-19 NOTE — Telephone Encounter (Signed)
Correction made

## 2020-01-31 ENCOUNTER — Ambulatory Visit: Payer: 59 | Admitting: Rheumatology

## 2020-02-22 ENCOUNTER — Other Ambulatory Visit: Payer: Self-pay | Admitting: *Deleted

## 2020-02-22 DIAGNOSIS — M3219 Other organ or system involvement in systemic lupus erythematosus: Secondary | ICD-10-CM

## 2020-02-22 MED ORDER — HYDROXYCHLOROQUINE SULFATE 200 MG PO TABS: ORAL_TABLET | ORAL | 0 refills | Status: DC

## 2020-02-22 NOTE — Telephone Encounter (Signed)
Refill request received via fax  Last Visit: 01/11/2020 Next Visit: 05/20/2020 Labs: 01/04/2020 Creatinine is elevated.   Eye exam:  07/28/2019 normal  Current Dose per office note 01/11/2020: PLQ 200 mg 1 tablet twice daily Monday to Friday she reduce this dose to 1 tablet p.o. daily sometimes due to GI side effects. XG:[id number] systemic lupus erythematosus with other organ involvement   Okay to refill Plaquenil?

## 2020-03-04 ENCOUNTER — Encounter: Payer: Self-pay | Admitting: Physical Medicine and Rehabilitation

## 2020-03-04 ENCOUNTER — Other Ambulatory Visit: Payer: Self-pay

## 2020-03-04 ENCOUNTER — Encounter: Payer: 59 | Attending: Physical Medicine and Rehabilitation | Admitting: Physical Medicine and Rehabilitation

## 2020-03-04 VITALS — BP 149/85 | HR 77 | Temp 97.8°F | Wt 233.4 lb

## 2020-03-04 DIAGNOSIS — M797 Fibromyalgia: Secondary | ICD-10-CM | POA: Diagnosis not present

## 2020-03-04 DIAGNOSIS — M329 Systemic lupus erythematosus, unspecified: Secondary | ICD-10-CM | POA: Diagnosis present

## 2020-03-04 DIAGNOSIS — Z5181 Encounter for therapeutic drug level monitoring: Secondary | ICD-10-CM | POA: Insufficient documentation

## 2020-03-04 DIAGNOSIS — R2681 Unsteadiness on feet: Secondary | ICD-10-CM | POA: Diagnosis not present

## 2020-03-04 DIAGNOSIS — R208 Other disturbances of skin sensation: Secondary | ICD-10-CM | POA: Diagnosis present

## 2020-03-04 DIAGNOSIS — M7918 Myalgia, other site: Secondary | ICD-10-CM | POA: Diagnosis present

## 2020-03-04 DIAGNOSIS — G894 Chronic pain syndrome: Secondary | ICD-10-CM | POA: Diagnosis not present

## 2020-03-04 DIAGNOSIS — M19042 Primary osteoarthritis, left hand: Secondary | ICD-10-CM

## 2020-03-04 DIAGNOSIS — R42 Dizziness and giddiness: Secondary | ICD-10-CM | POA: Diagnosis not present

## 2020-03-04 DIAGNOSIS — Z79891 Long term (current) use of opiate analgesic: Secondary | ICD-10-CM | POA: Insufficient documentation

## 2020-03-04 DIAGNOSIS — M19041 Primary osteoarthritis, right hand: Secondary | ICD-10-CM

## 2020-03-04 DIAGNOSIS — M5126 Other intervertebral disc displacement, lumbar region: Secondary | ICD-10-CM

## 2020-03-04 NOTE — Patient Instructions (Signed)
1. Chronic pain syndrome with back pain.  2. Unsteady- frequent falls    Plan:     1. Has appointment with Dr Mannie Stabile- doesn't remember when.   2. Will check CBC with diff and CMP- since had Losartan in past Cr was up to 1.1- normally 0.93-   3. Has appointment with Neurology due to constant shaking LEs/constant moving- of note, worse since on Vrylar.   4. Got disability finally! Can't show that she's excited, per pt.   5.  Hold Losartan- for now. Until I can speak with Dr Mannie Stabile. Will d/w Vrylar with Dr Mannie Stabile and losartan.   6. Not sure if needs refills. Will let me know.   7. PT referral for balance and steadiness.   8. F/U in 8 weeks.
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
## 2020-03-05 LAB — CBC WITH DIFFERENTIAL/PLATELET
Basophils Absolute: 0.1 10*3/uL (ref 0.0–0.2)
Basos: 1 %
EOS (ABSOLUTE): 0.1 10*3/uL (ref 0.0–0.4)
Eos: 1 %
Hematocrit: 36.5 % (ref 34.0–46.6)
Hemoglobin: 12 g/dL (ref 11.1–15.9)
Immature Grans (Abs): 0 10*3/uL (ref 0.0–0.1)
Immature Granulocytes: 0 %
Lymphocytes Absolute: 1.3 10*3/uL (ref 0.7–3.1)
Lymphs: 22 %
MCH: 27.4 pg (ref 26.6–33.0)
MCHC: 32.9 g/dL (ref 31.5–35.7)
MCV: 83 fL (ref 79–97)
Monocytes Absolute: 0.7 10*3/uL (ref 0.1–0.9)
Monocytes: 12 %
Neutrophils Absolute: 3.9 10*3/uL (ref 1.4–7.0)
Neutrophils: 64 %
Platelets: 290 10*3/uL (ref 150–450)
RBC: 4.38 x10E6/uL (ref 3.77–5.28)
RDW: 14.8 % (ref 11.7–15.4)
WBC: 6.1 10*3/uL (ref 3.4–10.8)

## 2020-03-05 LAB — COMPREHENSIVE METABOLIC PANEL
ALT: 26 IU/L (ref 0–32)
AST: 25 IU/L (ref 0–40)
Albumin/Globulin Ratio: 1.8 (ref 1.2–2.2)
Albumin: 4.2 g/dL (ref 3.8–4.9)
Alkaline Phosphatase: 86 IU/L (ref 44–121)
BUN/Creatinine Ratio: 7 — ABNORMAL LOW (ref 12–28)
BUN: 7 mg/dL — ABNORMAL LOW (ref 8–27)
Bilirubin Total: 0.2 mg/dL (ref 0.0–1.2)
CO2: 26 mmol/L (ref 20–29)
Calcium: 9 mg/dL (ref 8.7–10.3)
Chloride: 95 mmol/L — ABNORMAL LOW (ref 96–106)
Creatinine, Ser: 0.95 mg/dL (ref 0.57–1.00)
GFR calc Af Amer: 75 mL/min/{1.73_m2} (ref >59)
GFR calc non Af Amer: 65 mL/min/{1.73_m2} (ref >59)
Globulin, Total: 2.4 g/dL (ref 1.5–4.5)
Glucose: 55 mg/dL — ABNORMAL LOW (ref 65–99)
Potassium: 4.1 mmol/L (ref 3.5–5.2)
Sodium: 136 mmol/L (ref 134–144)
Total Protein: 6.6 g/dL (ref 6.0–8.5)

## 2020-03-07 ENCOUNTER — Telehealth: Payer: Self-pay | Admitting: Registered Nurse

## 2020-03-07 NOTE — Telephone Encounter (Signed)
Placed a call to Terri Hurley, regarding her blood work, No answer, left message to return the call.

## 2020-04-22 ENCOUNTER — Other Ambulatory Visit: Payer: Self-pay

## 2020-04-22 ENCOUNTER — Encounter: Payer: Self-pay | Admitting: Neurology

## 2020-04-22 ENCOUNTER — Ambulatory Visit (INDEPENDENT_AMBULATORY_CARE_PROVIDER_SITE_OTHER): Payer: 59 | Admitting: Neurology

## 2020-04-22 VITALS — BP 157/82 | HR 77 | Ht 61.5 in | Wt 226.0 lb

## 2020-04-22 DIAGNOSIS — R251 Tremor, unspecified: Secondary | ICD-10-CM

## 2020-04-22 DIAGNOSIS — M329 Systemic lupus erythematosus, unspecified: Secondary | ICD-10-CM

## 2020-04-22 NOTE — Patient Instructions (Signed)
You have a mild and intermittent tremor of both hands and and also in the legs. It is possible, that your tremor is in part due to medication side effects including the Lexapro and Vraylar.  I do not see any obvious signs of parkinson's like disease.   For your tremor, I would not recommend any new medication for fear of side effects (especially sleepiness) or medication interactions, especially in light of you already taking several psychotropic medications.   Please remember, that any kind of tremor may be exacerbated by anxiety, anger, nervousness, excitement, dehydration, sleep deprivation, by caffeine, and low blood sugar values or blood sugar fluctuations, also thyroid dysfunction.  Please make sure that you get your thyroid function checked with Dr. Roma Kayser office at the upcoming appointment.  As discussed, certain medications can also exacerbate tremors.    We will do an EEG (brainwave test), which we will schedule. We will call you with the results.  We will do a brain scan, called MRI and call you with the test results. We will have to schedule you for this on a separate date. This test requires authorization from your insurance, and we will take care of the insurance process.  We will call you with the test results and follow-up in this clinic if needed.
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
## 2020-04-23 ENCOUNTER — Telehealth: Payer: Self-pay | Admitting: Neurology

## 2020-04-23 NOTE — Telephone Encounter (Signed)
LVM for pt to call back about scheduling mri  [facility] auth: [id number] (exp. 04/23/20 to 07/22/20)

## 2020-04-23 NOTE — Telephone Encounter (Signed)
[facility] pending

## 2020-04-25 ENCOUNTER — Other Ambulatory Visit: Payer: Self-pay | Admitting: Physical Medicine and Rehabilitation

## 2020-04-25 ENCOUNTER — Other Ambulatory Visit: Payer: Self-pay | Admitting: Rheumatology

## 2020-04-25 DIAGNOSIS — M3219 Other organ or system involvement in systemic lupus erythematosus: Secondary | ICD-10-CM

## 2020-04-29 ENCOUNTER — Encounter: Payer: Self-pay | Admitting: Physical Medicine and Rehabilitation

## 2020-04-29 ENCOUNTER — Other Ambulatory Visit: Payer: Self-pay

## 2020-04-29 ENCOUNTER — Encounter: Payer: 59 | Attending: Physical Medicine and Rehabilitation | Admitting: Physical Medicine and Rehabilitation

## 2020-04-29 VITALS — BP 144/80 | HR 75 | Temp 98.3°F | Ht 61.5 in | Wt 225.0 lb

## 2020-04-29 DIAGNOSIS — R208 Other disturbances of skin sensation: Secondary | ICD-10-CM | POA: Diagnosis present

## 2020-04-29 DIAGNOSIS — F329 Major depressive disorder, single episode, unspecified: Secondary | ICD-10-CM | POA: Insufficient documentation

## 2020-04-29 DIAGNOSIS — Z79891 Long term (current) use of opiate analgesic: Secondary | ICD-10-CM | POA: Insufficient documentation

## 2020-04-29 DIAGNOSIS — Z5181 Encounter for therapeutic drug level monitoring: Secondary | ICD-10-CM | POA: Diagnosis not present

## 2020-04-29 DIAGNOSIS — M329 Systemic lupus erythematosus, unspecified: Secondary | ICD-10-CM | POA: Diagnosis present

## 2020-04-29 DIAGNOSIS — G894 Chronic pain syndrome: Secondary | ICD-10-CM | POA: Diagnosis not present

## 2020-04-29 DIAGNOSIS — M5136 Other intervertebral disc degeneration, lumbar region: Secondary | ICD-10-CM | POA: Insufficient documentation

## 2020-04-29 DIAGNOSIS — R2681 Unsteadiness on feet: Secondary | ICD-10-CM | POA: Diagnosis not present

## 2020-04-29 MED ORDER — TRAMADOL HCL 50 MG PO TABS: 50.0000 mg | ORAL_TABLET | Freq: Three times a day (TID) | ORAL | 5 refills | Status: DC | PRN
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
## 2020-04-29 NOTE — Patient Instructions (Signed)
Pt is a 59 yr old with severe depression- doesn't think has bipolar d/o,allodynia,Lupus, fibromyalgia, and myofascial pain syndrome,and piriformis syndromeas well as bladder spasms and L4/5 disc issues with need for epidural- s/p epidural Mid January.  1. Can see Dr Ernestina Patches for epidural steroid injections.   2. Continue tramadol- refill for pt- with 6 months refills.   3. Get Urine drug screen due to tramadol  4. Con't gabapentin and Lyrica.  Can ask if Lyrica/pregablin can be 90 day supply.    5. F/U in  2 months-

## 2020-05-01 ENCOUNTER — Ambulatory Visit (INDEPENDENT_AMBULATORY_CARE_PROVIDER_SITE_OTHER): Payer: 59 | Admitting: Neurology

## 2020-05-01 ENCOUNTER — Other Ambulatory Visit: Payer: Self-pay

## 2020-05-01 DIAGNOSIS — R251 Tremor, unspecified: Secondary | ICD-10-CM

## 2020-05-01 DIAGNOSIS — M329 Systemic lupus erythematosus, unspecified: Secondary | ICD-10-CM

## 2020-05-02 LAB — TOXASSURE SELECT,+ANTIDEPR,UR

## 2020-05-03 ENCOUNTER — Telehealth: Payer: Self-pay | Admitting: *Deleted

## 2020-05-03 NOTE — Telephone Encounter (Signed)
Urine drug screen for this encounter is consistent for prescribed medication 
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
## 2020-05-20 ENCOUNTER — Encounter: Payer: Self-pay | Admitting: Rheumatology

## 2020-05-20 ENCOUNTER — Ambulatory Visit (INDEPENDENT_AMBULATORY_CARE_PROVIDER_SITE_OTHER): Payer: 59 | Admitting: Rheumatology

## 2020-05-20 ENCOUNTER — Other Ambulatory Visit: Payer: Self-pay

## 2020-05-20 VITALS — BP 134/85 | HR 75 | Resp 17 | Ht 61.5 in | Wt 225.2 lb

## 2020-05-20 DIAGNOSIS — M19042 Primary osteoarthritis, left hand: Secondary | ICD-10-CM

## 2020-05-20 DIAGNOSIS — M3219 Other organ or system involvement in systemic lupus erythematosus: Secondary | ICD-10-CM

## 2020-05-20 DIAGNOSIS — Z79899 Other long term (current) drug therapy: Secondary | ICD-10-CM

## 2020-05-20 DIAGNOSIS — M19041 Primary osteoarthritis, right hand: Secondary | ICD-10-CM

## 2020-05-20 DIAGNOSIS — M797 Fibromyalgia: Secondary | ICD-10-CM | POA: Diagnosis not present

## 2020-05-20 DIAGNOSIS — R748 Abnormal levels of other serum enzymes: Secondary | ICD-10-CM

## 2020-05-20 DIAGNOSIS — E559 Vitamin D deficiency, unspecified: Secondary | ICD-10-CM

## 2020-05-20 DIAGNOSIS — M5136 Other intervertebral disc degeneration, lumbar region: Secondary | ICD-10-CM

## 2020-05-20 DIAGNOSIS — G252 Other specified forms of tremor: Secondary | ICD-10-CM

## 2020-05-20 DIAGNOSIS — R5383 Other fatigue: Secondary | ICD-10-CM

## 2020-05-20 DIAGNOSIS — Z8269 Family history of other diseases of the musculoskeletal system and connective tissue: Secondary | ICD-10-CM

## 2020-05-20 DIAGNOSIS — K219 Gastro-esophageal reflux disease without esophagitis: Secondary | ICD-10-CM

## 2020-05-20 DIAGNOSIS — F5101 Primary insomnia: Secondary | ICD-10-CM

## 2020-05-20 DIAGNOSIS — E669 Obesity, unspecified: Secondary | ICD-10-CM

## 2020-05-20 DIAGNOSIS — K760 Fatty (change of) liver, not elsewhere classified: Secondary | ICD-10-CM

## 2020-05-20 DIAGNOSIS — I10 Essential (primary) hypertension: Secondary | ICD-10-CM

## 2020-05-20 DIAGNOSIS — M5030 Other cervical disc degeneration, unspecified cervical region: Secondary | ICD-10-CM

## 2020-05-20 MED ORDER — HYDROXYCHLOROQUINE SULFATE 200 MG PO TABS: ORAL_TABLET | ORAL | 0 refills | Status: DC

## 2020-05-20 NOTE — Patient Instructions (Signed)

## 2020-05-21 ENCOUNTER — Telehealth: Payer: Self-pay

## 2020-05-21 LAB — C3 AND C4
C3 Complement: 143 mg/dL (ref 83–193)
C4 Complement: 19 mg/dL (ref 15–57)

## 2020-05-21 LAB — CBC WITH DIFFERENTIAL/PLATELET
Absolute Monocytes: 455 cells/uL (ref 200–950)
Basophils Absolute: 50 cells/uL (ref 0–200)
Basophils Relative: 1 %
Eosinophils Absolute: 40 cells/uL (ref 15–500)
Eosinophils Relative: 0.8 %
HCT: 38.9 % (ref 35.0–45.0)
Hemoglobin: 12.7 g/dL (ref 11.7–15.5)
Lymphs Abs: 950 cells/uL (ref 850–3900)
MCH: 27.6 pg (ref 27.0–33.0)
MCHC: 32.6 g/dL (ref 32.0–36.0)
MCV: 84.6 fL (ref 80.0–100.0)
MPV: 11 fL (ref 7.5–12.5)
Monocytes Relative: 9.1 %
Neutro Abs: 3505 cells/uL (ref 1500–7800)
Neutrophils Relative %: 70.1 %
Platelets: 288 10*3/uL (ref 140–400)
RBC: 4.6 10*6/uL (ref 3.80–5.10)
RDW: 15.1 % — ABNORMAL HIGH (ref 11.0–15.0)
Total Lymphocyte: 19 %
WBC: 5 10*3/uL (ref 3.8–10.8)

## 2020-05-21 LAB — COMPLETE METABOLIC PANEL WITH GFR
AG Ratio: 1.5 (calc) (ref 1.0–2.5)
ALT: 33 U/L — ABNORMAL HIGH (ref 6–29)
AST: 28 U/L (ref 10–35)
Albumin: 4.3 g/dL (ref 3.6–5.1)
Alkaline phosphatase (APISO): 78 U/L (ref 37–153)
BUN: 7 mg/dL (ref 7–25)
CO2: 28 mmol/L (ref 20–32)
Calcium: 9.2 mg/dL (ref 8.6–10.4)
Chloride: 98 mmol/L (ref 98–110)
Creat: 0.98 mg/dL (ref 0.50–0.99)
GFR, Est African American: 73 mL/min/{1.73_m2} (ref >=60)
GFR, Est Non African American: 63 mL/min/{1.73_m2} (ref >=60)
Globulin: 2.8 g/dL (calc) (ref 1.9–3.7)
Glucose, Bld: 89 mg/dL (ref 65–99)
Potassium: 4.4 mmol/L (ref 3.5–5.3)
Sodium: 134 mmol/L — ABNORMAL LOW (ref 135–146)
Total Bilirubin: 0.5 mg/dL (ref 0.2–1.2)
Total Protein: 7.1 g/dL (ref 6.1–8.1)

## 2020-05-21 LAB — URINALYSIS, ROUTINE W REFLEX MICROSCOPIC
Bacteria, UA: NONE SEEN /HPF
Bilirubin Urine: NEGATIVE
Glucose, UA: NEGATIVE
Hgb urine dipstick: NEGATIVE
Hyaline Cast: NONE SEEN /LPF
Ketones, ur: NEGATIVE
Nitrite: NEGATIVE
RBC / HPF: NONE SEEN /HPF (ref 0–2)
Specific Gravity, Urine: 1.01 (ref 1.001–1.03)
WBC, UA: NONE SEEN /HPF (ref 0–5)
pH: 7 (ref 5.0–8.0)

## 2020-05-21 LAB — ANTI-DNA ANTIBODY, DOUBLE-STRANDED: ds DNA Ab: 9 IU/mL — ABNORMAL HIGH

## 2020-05-21 LAB — SEDIMENTATION RATE: Sed Rate: 9 mm/h (ref 0–30)

## 2020-05-21 NOTE — Progress Notes (Signed)
Please call patient and advise her that her EEG, her electrical brainwave test, was reported as normal.

## 2020-05-21 NOTE — Progress Notes (Signed)
Labs are stable.  No change in treatment advised.  Liver functions are mildly elevated.  Avoid all NSAIDs, Tylenol and alcohol use.  Please forward labs to her PCP.

## 2020-05-21 NOTE — Telephone Encounter (Signed)
I called pt. No answer, left a message on cell phone, per DPR, advising her of EEG results. I asked pt to call us back with questions or concerns.

## 2020-05-21 NOTE — Telephone Encounter (Signed)
-----   Message from Star Age, MD sent at 05/21/2020  8:27 AM EST ----- Please call patient and advise her that her EEG, her electrical brainwave test, was reported as normal.

## 2020-06-16 ENCOUNTER — Other Ambulatory Visit: Payer: Self-pay | Admitting: Physical Medicine and Rehabilitation

## 2020-06-24 ENCOUNTER — Other Ambulatory Visit: Payer: Self-pay

## 2020-06-24 ENCOUNTER — Encounter: Payer: Self-pay | Admitting: Physical Medicine and Rehabilitation

## 2020-06-24 ENCOUNTER — Encounter: Payer: 59 | Attending: Physical Medicine and Rehabilitation | Admitting: Physical Medicine and Rehabilitation

## 2020-06-24 VITALS — BP 119/86 | HR 71 | Temp 98.7°F | Ht 61.5 in | Wt 222.2 lb

## 2020-06-24 DIAGNOSIS — G894 Chronic pain syndrome: Secondary | ICD-10-CM

## 2020-06-24 DIAGNOSIS — M797 Fibromyalgia: Secondary | ICD-10-CM

## 2020-06-24 DIAGNOSIS — M5126 Other intervertebral disc displacement, lumbar region: Secondary | ICD-10-CM | POA: Diagnosis present

## 2020-06-24 DIAGNOSIS — R42 Dizziness and giddiness: Secondary | ICD-10-CM

## 2020-06-24 DIAGNOSIS — M5136 Other intervertebral disc degeneration, lumbar region: Secondary | ICD-10-CM | POA: Diagnosis present

## 2020-06-24 MED ORDER — TRAMADOL HCL 50 MG PO TABS: 50.0000 mg | ORAL_TABLET | Freq: Three times a day (TID) | ORAL | 5 refills | Status: DC | PRN

## 2020-06-24 MED ORDER — PREGABALIN 100 MG PO CAPS: 100.0000 mg | ORAL_CAPSULE | Freq: Two times a day (BID) | ORAL | 1 refills | Status: DC
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
## 2020-06-24 NOTE — Patient Instructions (Signed)
Pt is a 60 yr old with severe depression- doesn't think has bipolar d/o per pt,allodynia,Lupus, fibromyalgia, and myofascial pain syndrome,and piriformis syndromeas well as bladder spasms and L4/5 disc issues with need for epidural- s/p epidural Mid January 2021.    1. Call Dr [place] Blas office and ask them to determine how much epidural injection would cost, so she can budget for it.   2. Went over med list- don't see anything that would cause her to lose teeth.   3. Went through list of PCPs- female family medicine doctors- gave pt list of 5 names that she could find for PCP to find a new PCP.  Spent 12 minutes in room with pt doing this- looked up names and numbers to call and gave to pt.   4. Will refill Lyrica 3 months supply 100 mg BID with 3 months supply- 1 refill.   5. Need to make sure that  pharmacy refills tramadol MONTHLY- not every other month.  I've written for monthly, refills.   6. F/U in 10 weeks.

## 2020-06-28 ENCOUNTER — Telehealth: Payer: Self-pay | Admitting: Physical Medicine and Rehabilitation

## 2020-06-28 NOTE — Telephone Encounter (Signed)
Patient called. She would like an appointment with Dr Ernestina Patches. Her call back number is [phone]

## 2020-06-28 NOTE — Telephone Encounter (Signed)
Needs authorization and scheduling for bilateral S1 TF. I called patient to giver her the codes and the phone number for pricing estimate.

## 2020-06-28 NOTE — Telephone Encounter (Signed)
Repeat last, give her codes if needed

## 2020-06-28 NOTE — Telephone Encounter (Signed)
Per last OV note with Dr. Dagoberto Ligas:  1. Call Dr Romona Curls office and ask them to determine how much epidural injection would cost, so she can budget for it.   Patient's last saw you 07/06/19 for bilateral S1 TF. I do not see a referral. Please advise.

## 2020-07-01 NOTE — Telephone Encounter (Signed)
Insurance PENDING 

## 2020-07-01 NOTE — Telephone Encounter (Signed)
Called pt and sch 1/31

## 2020-07-01 NOTE — Telephone Encounter (Signed)
Pt was approve. Ref# [id number]

## 2020-07-22 ENCOUNTER — Ambulatory Visit: Payer: 59 | Admitting: Physical Medicine and Rehabilitation

## 2020-07-22 ENCOUNTER — Telehealth: Payer: Self-pay | Admitting: Physical Medicine and Rehabilitation

## 2020-07-22 NOTE — Telephone Encounter (Signed)
Called and r/s 2/21

## 2020-07-22 NOTE — Telephone Encounter (Signed)
Patient called needing to reschedule her appointment due to Covid-19 virus. The number to contact patient is [phone]

## 2020-08-02 ENCOUNTER — Encounter: Payer: Self-pay | Admitting: Rheumatology
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
## 2020-08-12 ENCOUNTER — Encounter: Payer: Self-pay | Admitting: Physical Medicine and Rehabilitation

## 2020-08-12 ENCOUNTER — Ambulatory Visit (INDEPENDENT_AMBULATORY_CARE_PROVIDER_SITE_OTHER): Payer: 59 | Admitting: Physical Medicine and Rehabilitation

## 2020-08-12 ENCOUNTER — Ambulatory Visit: Payer: Self-pay

## 2020-08-12 ENCOUNTER — Other Ambulatory Visit: Payer: Self-pay

## 2020-08-12 VITALS — BP 141/79 | HR 82

## 2020-08-12 DIAGNOSIS — M5416 Radiculopathy, lumbar region: Secondary | ICD-10-CM | POA: Diagnosis not present

## 2020-08-12 MED ORDER — BETAMETHASONE SOD PHOS & ACET 6 (3-3) MG/ML IJ SUSP
12.0000 mg | Freq: Once | INTRAMUSCULAR | Status: AC
  Administered 2020-08-12: 12 mg

## 2020-08-12 NOTE — Procedures (Signed)
S1 Lumbosacral Transforaminal Epidural Steroid Injection - Sub-Pedicular Approach with Fluoroscopic Guidance   Patient: Terri Hurley      Date of Birth: 12/03/1960 MRN: [id number] PCP: [name], MD      Visit Date: 08/12/2020   Universal Protocol:    Date/Time: 08/12/2209:36 AM  Consent Given By: the patient  Position:  PRONE  Additional Comments: Vital signs were monitored before and after the procedure. Patient was prepped and draped in the usual sterile fashion. The correct patient, procedure, and site was verified.   Injection Procedure Details:  Procedure Site One Meds Administered:  Meds ordered this encounter  Medications  . betamethasone acetate-betamethasone sodium phosphate (CELESTONE) injection 12 mg    Laterality: Bilateral  Location/Site:  S1 Foramen   Needle size: 22 ga.  Needle type: Spinal  Needle Placement: Transforaminal  Findings:   -Comments: Excellent flow of contrast along the nerve, nerve root and into the epidural space.  Epidurogram: Contrast epidurogram showed no nerve root cut off or restricted flow pattern.  Procedure Details: After squaring off the sacral end-plate to get a true AP view, the C-arm was positioned so that the best possible view of the S1 foramen was visualized. The soft tissues overlying this structure were infiltrated with 2-3 ml. of 1% Lidocaine without Epinephrine.    The spinal needle was inserted toward the target using a "trajectory" view along the fluoroscope beam.  Under AP and lateral visualization, the needle was advanced so it did not puncture dura. Biplanar projections were used to confirm position. Aspiration was confirmed to be negative for CSF and/or blood. A 1-2 ml. volume of Isovue-250 was injected and flow of contrast was noted at each level. Radiographs were obtained for documentation purposes.   After attaining the desired flow of contrast documented above, a 0.5 to 1.0 ml test dose of 0.25%  Marcaine was injected into each respective transforaminal space.  The patient was observed for 90 seconds post injection.  After no sensory deficits were reported, and normal lower extremity motor function was noted,   the above injectate was administered so that equal amounts of the injectate were placed at each foramen (level) into the transforaminal epidural space.   Additional Comments:  The patient tolerated the procedure well Dressing: Band-Aid with 2 x 2 sterile gauze    Post-procedure details: Patient was observed during the procedure. Post-procedure instructions were reviewed.  Patient left the clinic in stable condition.

## 2020-08-12 NOTE — Patient Instructions (Signed)

## 2020-08-12 NOTE — Progress Notes (Signed)
Terri Hurley - 60 y.o. female MRN [id number]  Date of birth: 21-Aug-1960  Office Visit Note: Visit Date: 08/12/2020 PCP: [name], MD Referred by: [name], MD  Subjective: Chief Complaint  Patient presents with  . Lower Back - Pain   HPI:  Terri Hurley is a 60 y.o. female who comes in today at the request of Dr. Rodell Perna for planned Bilateral S1-2 Lumbar epidural steroid injection with fluoroscopic guidance.  The patient has failed conservative care including home exercise, medications, time and activity modification.  This injection will be diagnostic and hopefully therapeutic.  Please see requesting physician notes for further details and justification.   ROS Otherwise per HPI.  Assessment & Plan: Visit Diagnoses:    ICD-10-CM   1. Lumbar radiculopathy  M54.16 XR C-ARM NO REPORT    Epidural Steroid injection    betamethasone acetate-betamethasone sodium phosphate (CELESTONE) injection 12 mg    Plan: No additional findings.   Meds & Orders:  Meds ordered this encounter  Medications  . betamethasone acetate-betamethasone sodium phosphate (CELESTONE) injection 12 mg    Orders Placed This Encounter  Procedures  . XR C-ARM NO REPORT  . Epidural Steroid injection    Follow-up: Return if symptoms worsen or fail to improve.   Procedures: No procedures performed  S1 Lumbosacral Transforaminal Epidural Steroid Injection - Sub-Pedicular Approach with Fluoroscopic Guidance   Patient: Terri Hurley      Date of Birth: 13-May-1961 MRN: [id number] PCP: [name], MD      Visit Date: 08/12/2020   Universal Protocol:    Date/Time: 08/12/2209:36 AM  Consent Given By: the patient  Position:  PRONE  Additional Comments: Vital signs were monitored before and after the procedure. Patient was prepped and draped in the usual sterile fashion. The correct patient, procedure, and site was verified.   Injection Procedure Details:  Procedure Site  One Meds Administered:  Meds ordered this encounter  Medications  . betamethasone acetate-betamethasone sodium phosphate (CELESTONE) injection 12 mg    Laterality: Bilateral  Location/Site:  S1 Foramen   Needle size: 22 ga.  Needle type: Spinal  Needle Placement: Transforaminal  Findings:   -Comments: Excellent flow of contrast along the nerve, nerve root and into the epidural space.  Epidurogram: Contrast epidurogram showed no nerve root cut off or restricted flow pattern.  Procedure Details: After squaring off the sacral end-plate to get a true AP view, the C-arm was positioned so that the best possible view of the S1 foramen was visualized. The soft tissues overlying this structure were infiltrated with 2-3 ml. of 1% Lidocaine without Epinephrine.    The spinal needle was inserted toward the target using a "trajectory" view along the fluoroscope beam.  Under AP and lateral visualization, the needle was advanced so it did not puncture dura. Biplanar projections were used to confirm position. Aspiration was confirmed to be negative for CSF and/or blood. A 1-2 ml. volume of Isovue-250 was injected and flow of contrast was noted at each level. Radiographs were obtained for documentation purposes.   After attaining the desired flow of contrast documented above, a 0.5 to 1.0 ml test dose of 0.25% Marcaine was injected into each respective transforaminal space.  The patient was observed for 90 seconds post injection.  After no sensory deficits were reported, and normal lower extremity motor function was noted,   the above injectate was administered so that equal amounts of the injectate were placed at each foramen (level) into the  transforaminal epidural space.   Additional Comments:  The patient tolerated the procedure well Dressing: Band-Aid with 2 x 2 sterile gauze    Post-procedure details: Patient was observed during the procedure. Post-procedure instructions were  reviewed.  Patient left the clinic in stable condition.     Clinical History: MRI LUMBAR SPINE WITHOUT CONTRAST  TECHNIQUE: Multiplanar, multisequence MR imaging of the lumbar spine was performed. No intravenous contrast was administered.  COMPARISON:  Two views lumbar spine 01/10/2019. MRI lumbar spine 11/05/2011.  FINDINGS: Segmentation:  Standard.  Alignment: No listhesis. Convex right scoliosis with the apex at approximately L3-4.  Vertebrae:  No fracture, evidence of discitis, or bone lesion.  Conus medullaris and cauda equina: Conus extends to the L1 level. Conus and cauda equina appear normal.  Paraspinal and other soft tissues: Negative.  Disc levels:  T11-12: Negative.  T12-L1: Negative.  L1-2: Shallow disc bulge without stenosis.  L2-3: Shallow disc bulge without stenosis.  L3-4: Shallow disc bulge without stenosis.  L4-5: Shallow disc bulge and mild facet degenerative disease. No stenosis.  L5-S1: The patient has a new central disc protrusion indenting the ventral thecal sac. A sequestered disc fragment measuring 0.9 cm craniocaudal x 0.7 cm AP x 0.4 cm transverse is seen in the left subarticular recess inferior to the disc interspace. The fragment contacts the left S1 root without compression or displacement. The foramina are widely patent.  IMPRESSION: New central disc protrusion at L5-S1 indents the ventral thecal sac but the central canal appears open. There is also a new sequestered disc fragment inferior to the L5-S1 disc interspace in the left subarticular recess. The fragment contacts the left S1 root without compression or displacement.  Mild disc bulging L1-2 to L4-5 without stenosis.   Electronically Signed   By: Inge Rise M.D.   On: 03/29/2019 15:03     Objective:  VS:  HT:    WT:   BMI:     BP:(!) 141/79  HR:82bpm  TEMP: ( )  RESP:  Physical Exam Vitals and nursing note reviewed.   Constitutional:      General: She is not in acute distress.    Appearance: Normal appearance. She is not ill-appearing.  HENT:     Head: Normocephalic and atraumatic.     Right Ear: External ear normal.     Left Ear: External ear normal.  Eyes:     Extraocular Movements: Extraocular movements intact.  Cardiovascular:     Rate and Rhythm: Normal rate.     Pulses: Normal pulses.  Pulmonary:     Effort: Pulmonary effort is normal. No respiratory distress.  Abdominal:     General: There is no distension.     Palpations: Abdomen is soft.  Musculoskeletal:        General: Tenderness present.     Cervical back: Neck supple.     Right lower leg: No edema.     Left lower leg: No edema.     Comments: Patient has good distal strength with no pain over the greater trochanters.  No clonus or focal weakness.  Skin:    Findings: No erythema, lesion or rash.  Neurological:     General: No focal deficit present.     Mental Status: She is alert and oriented to person, place, and time.     Sensory: No sensory deficit.     Motor: No weakness or abnormal muscle tone.     Coordination: Coordination normal.  Psychiatric:  Mood and Affect: Mood normal.        Behavior: Behavior normal.      Imaging: No results found.

## 2020-08-12 NOTE — Progress Notes (Signed)
Pt state lower back pain. Pt state walking and standing makes the pain worse. Pt states she takes pain meds and laying down to help ease her pain.  Numeric Pain Rating Scale and Functional Assessment Average Pain 6   In the last MONTH (on 0-10 scale) has pain interfered with the following?  1. General activity like being  able to carry out your everyday physical activities such as walking, climbing stairs, carrying groceries, or moving a chair?  Rating(9)   +Driver, -BT, -Dye Allergies.

## 2020-08-14 ENCOUNTER — Telehealth: Payer: Self-pay

## 2020-08-14 NOTE — Telephone Encounter (Signed)
Patient called stating her Psychiatrist put her on Risperidone and it is making her Restless Leg flare up. Please advise.

## 2020-08-15 NOTE — Telephone Encounter (Signed)
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

## 2020-08-16 NOTE — Telephone Encounter (Signed)
Left detailed message on VM per DPR. 

## 2020-08-20 ENCOUNTER — Other Ambulatory Visit: Payer: Self-pay

## 2020-08-20 ENCOUNTER — Encounter: Payer: Self-pay | Admitting: Rheumatology

## 2020-08-20 ENCOUNTER — Ambulatory Visit (INDEPENDENT_AMBULATORY_CARE_PROVIDER_SITE_OTHER): Payer: 59 | Admitting: Rheumatology

## 2020-08-20 VITALS — BP 144/83 | HR 80 | Resp 16 | Ht 61.5 in | Wt 220.6 lb

## 2020-08-20 DIAGNOSIS — Z79899 Other long term (current) drug therapy: Secondary | ICD-10-CM

## 2020-08-20 DIAGNOSIS — R296 Repeated falls: Secondary | ICD-10-CM

## 2020-08-20 DIAGNOSIS — M19041 Primary osteoarthritis, right hand: Secondary | ICD-10-CM

## 2020-08-20 DIAGNOSIS — R748 Abnormal levels of other serum enzymes: Secondary | ICD-10-CM

## 2020-08-20 DIAGNOSIS — M3219 Other organ or system involvement in systemic lupus erythematosus: Secondary | ICD-10-CM

## 2020-08-20 DIAGNOSIS — M797 Fibromyalgia: Secondary | ICD-10-CM

## 2020-08-20 DIAGNOSIS — Z13820 Encounter for screening for osteoporosis: Secondary | ICD-10-CM

## 2020-08-20 DIAGNOSIS — G252 Other specified forms of tremor: Secondary | ICD-10-CM

## 2020-08-20 DIAGNOSIS — R5383 Other fatigue: Secondary | ICD-10-CM

## 2020-08-20 DIAGNOSIS — Z8269 Family history of other diseases of the musculoskeletal system and connective tissue: Secondary | ICD-10-CM

## 2020-08-20 DIAGNOSIS — Z780 Asymptomatic menopausal state: Secondary | ICD-10-CM

## 2020-08-20 DIAGNOSIS — M5030 Other cervical disc degeneration, unspecified cervical region: Secondary | ICD-10-CM | POA: Diagnosis not present

## 2020-08-20 DIAGNOSIS — E669 Obesity, unspecified: Secondary | ICD-10-CM

## 2020-08-20 DIAGNOSIS — M5136 Other intervertebral disc degeneration, lumbar region: Secondary | ICD-10-CM

## 2020-08-20 DIAGNOSIS — K219 Gastro-esophageal reflux disease without esophagitis: Secondary | ICD-10-CM

## 2020-08-20 DIAGNOSIS — F5101 Primary insomnia: Secondary | ICD-10-CM

## 2020-08-20 DIAGNOSIS — I10 Essential (primary) hypertension: Secondary | ICD-10-CM

## 2020-08-20 DIAGNOSIS — M19042 Primary osteoarthritis, left hand: Secondary | ICD-10-CM

## 2020-08-20 DIAGNOSIS — K760 Fatty (change of) liver, not elsewhere classified: Secondary | ICD-10-CM

## 2020-08-20 DIAGNOSIS — E559 Vitamin D deficiency, unspecified: Secondary | ICD-10-CM

## 2020-08-20 MED ORDER — HYDROXYCHLOROQUINE SULFATE 200 MG PO TABS: ORAL_TABLET | ORAL | 0 refills | Status: DC

## 2020-08-21 LAB — COMPLETE METABOLIC PANEL WITH GFR
AG Ratio: 1.3 (calc) (ref 1.0–2.5)
ALT: 49 U/L — ABNORMAL HIGH (ref 6–29)
AST: 40 U/L — ABNORMAL HIGH (ref 10–35)
Albumin: 3.7 g/dL (ref 3.6–5.1)
Alkaline phosphatase (APISO): 80 U/L (ref 37–153)
BUN: 7 mg/dL (ref 7–25)
CO2: 26 mmol/L (ref 20–32)
Calcium: 8.9 mg/dL (ref 8.6–10.4)
Chloride: 103 mmol/L (ref 98–110)
Creat: 0.9 mg/dL (ref 0.50–0.99)
GFR, Est African American: 81 mL/min/{1.73_m2} (ref >=60)
GFR, Est Non African American: 69 mL/min/{1.73_m2} (ref >=60)
Globulin: 2.9 g/dL (calc) (ref 1.9–3.7)
Glucose, Bld: 100 mg/dL — ABNORMAL HIGH (ref 65–99)
Potassium: 4.1 mmol/L (ref 3.5–5.3)
Sodium: 137 mmol/L (ref 135–146)
Total Bilirubin: 0.5 mg/dL (ref 0.2–1.2)
Total Protein: 6.6 g/dL (ref 6.1–8.1)

## 2020-08-21 LAB — URINALYSIS, ROUTINE W REFLEX MICROSCOPIC
Bilirubin Urine: NEGATIVE
Glucose, UA: NEGATIVE
Hgb urine dipstick: NEGATIVE
Ketones, ur: NEGATIVE
Nitrite: NEGATIVE
Protein, ur: NEGATIVE
Specific Gravity, Urine: 1.01 (ref 1.001–1.03)
pH: 5.5 (ref 5.0–8.0)

## 2020-08-21 LAB — CBC WITH DIFFERENTIAL/PLATELET
Absolute Monocytes: 680 cells/uL (ref 200–950)
Basophils Absolute: 33 cells/uL (ref 0–200)
Basophils Relative: 0.5 %
Eosinophils Absolute: 79 cells/uL (ref 15–500)
Eosinophils Relative: 1.2 %
HCT: 36.5 % (ref 35.0–45.0)
Hemoglobin: 12 g/dL (ref 11.7–15.5)
Lymphs Abs: 1003 cells/uL (ref 850–3900)
MCH: 28.1 pg (ref 27.0–33.0)
MCHC: 32.9 g/dL (ref 32.0–36.0)
MCV: 85.5 fL (ref 80.0–100.0)
MPV: 11.2 fL (ref 7.5–12.5)
Monocytes Relative: 10.3 %
Neutro Abs: 4805 cells/uL (ref 1500–7800)
Neutrophils Relative %: 72.8 %
Platelets: 264 10*3/uL (ref 140–400)
RBC: 4.27 10*6/uL (ref 3.80–5.10)
RDW: 15.6 % — ABNORMAL HIGH (ref 11.0–15.0)
Total Lymphocyte: 15.2 %
WBC: 6.6 10*3/uL (ref 3.8–10.8)

## 2020-08-21 LAB — BETA-2 GLYCOPROTEIN ANTIBODIES
Beta-2 Glyco 1 IgA: 30.4 U/mL — ABNORMAL HIGH
Beta-2 Glyco 1 IgM: 38.5 U/mL — ABNORMAL HIGH
Beta-2 Glyco I IgG: 39.4 U/mL — ABNORMAL HIGH

## 2020-08-21 LAB — C3 AND C4
C3 Complement: 148 mg/dL (ref 83–193)
C4 Complement: 18 mg/dL (ref 15–57)

## 2020-08-21 LAB — SEDIMENTATION RATE: Sed Rate: 22 mm/h (ref 0–30)

## 2020-08-21 LAB — MICROSCOPIC MESSAGE

## 2020-08-21 LAB — ANTI-DNA ANTIBODY, DOUBLE-STRANDED: ds DNA Ab: 4 IU/mL

## 2020-08-21 NOTE — Progress Notes (Signed)
LFTs elevated.  CBC is normal.  Sed rate complements are normal.  Some of the results are still pending.  Please advise patient not to take any NSAIDs and avoid alcohol.

## 2020-08-21 NOTE — Progress Notes (Signed)
UA showed trace white cells otherwise unremarkable, sed rate and complements are normal.  Beta-2 GP 1 antibodies positive,dsDNA is negative.  No change in treatment advised.

## 2020-08-22 IMAGING — MG DIGITAL SCREENING BILAT W/ TOMO W/ CAD
6 of 12 series · 6 of 36 positions shown · non-contrast
Comparison: Previous exam(s).

CLINICAL DATA: Screening.

EXAM:
DIGITAL SCREENING BILATERAL MAMMOGRAM WITH TOMO AND CAD

[R MLO synth-2D (1 of 2)]
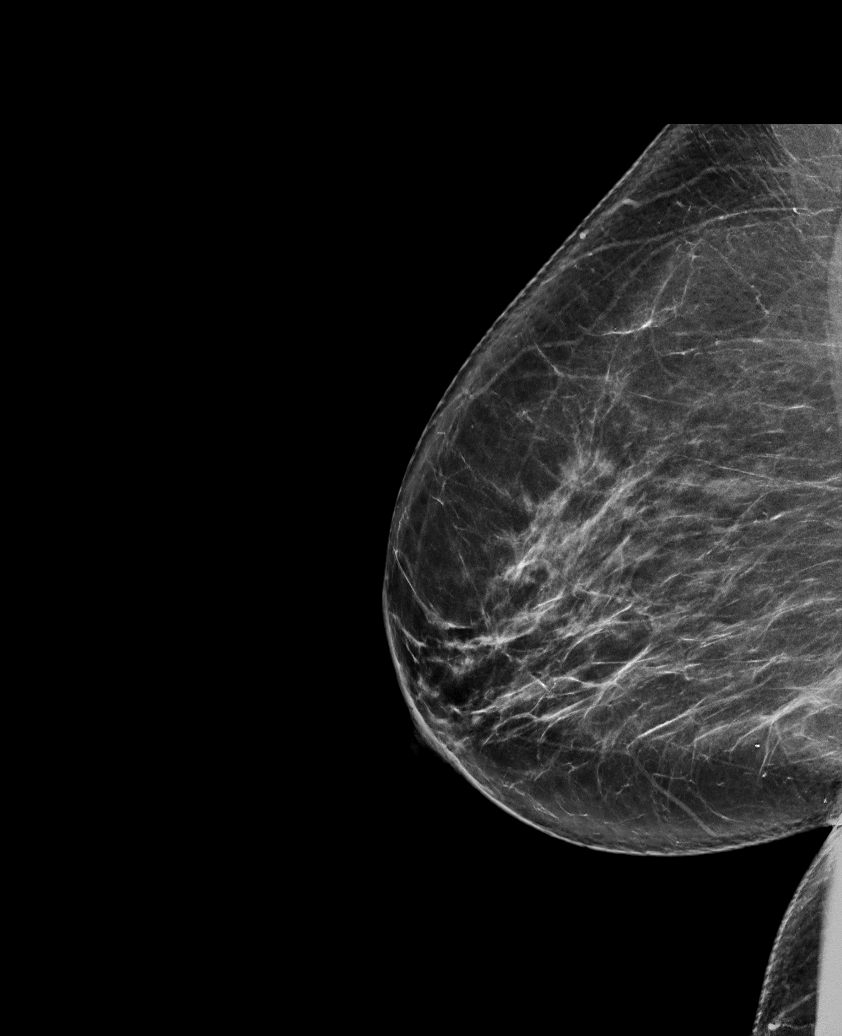

[L MLO synth-2D (1 of 2)]
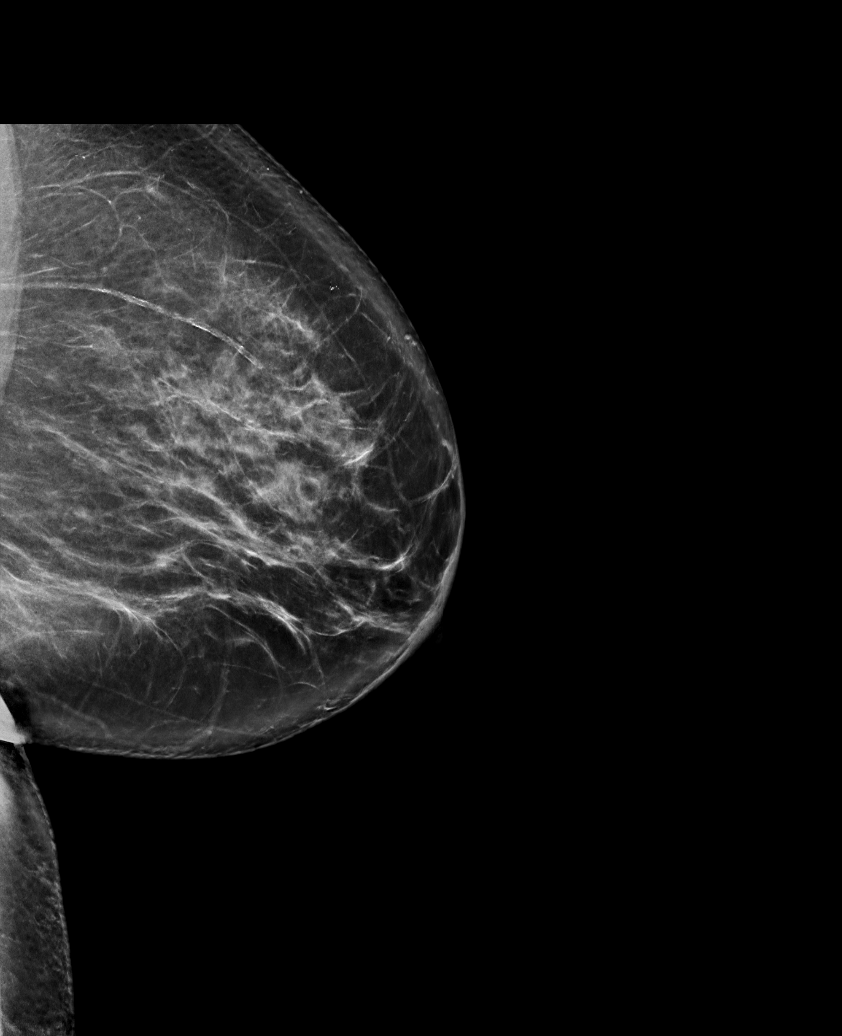

[L MLO synth-2D (2 of 2)]
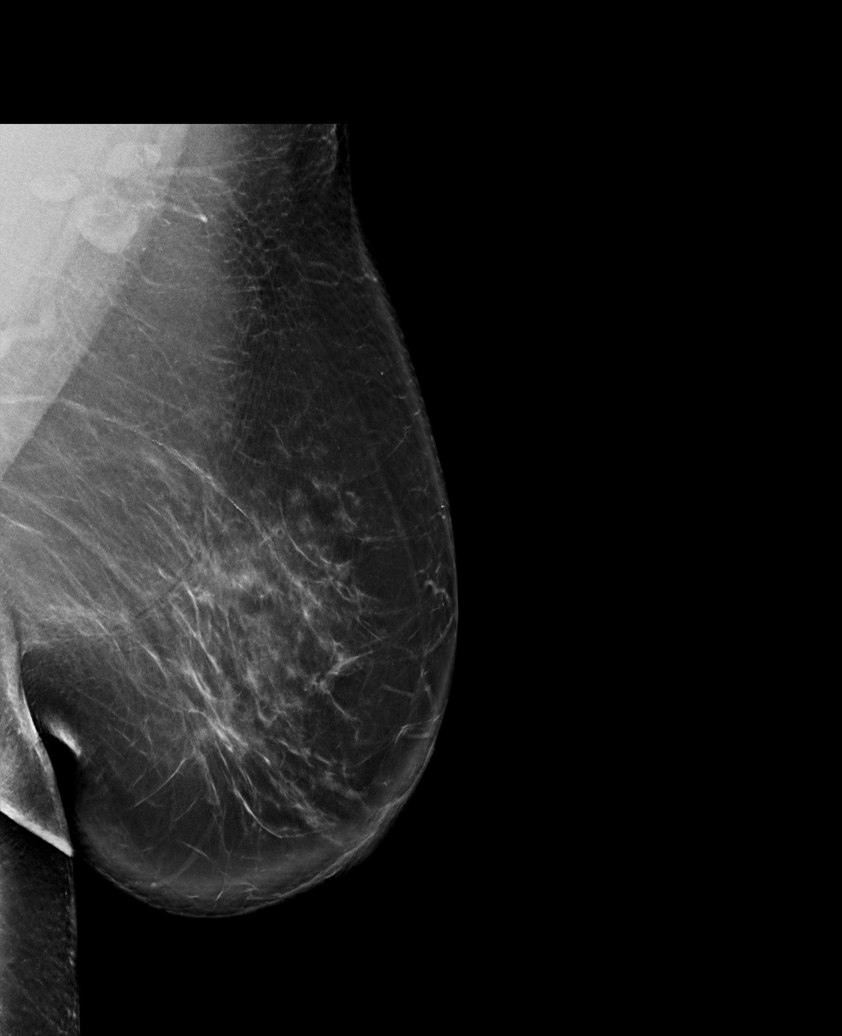

[R MLO synth-2D (2 of 2)]
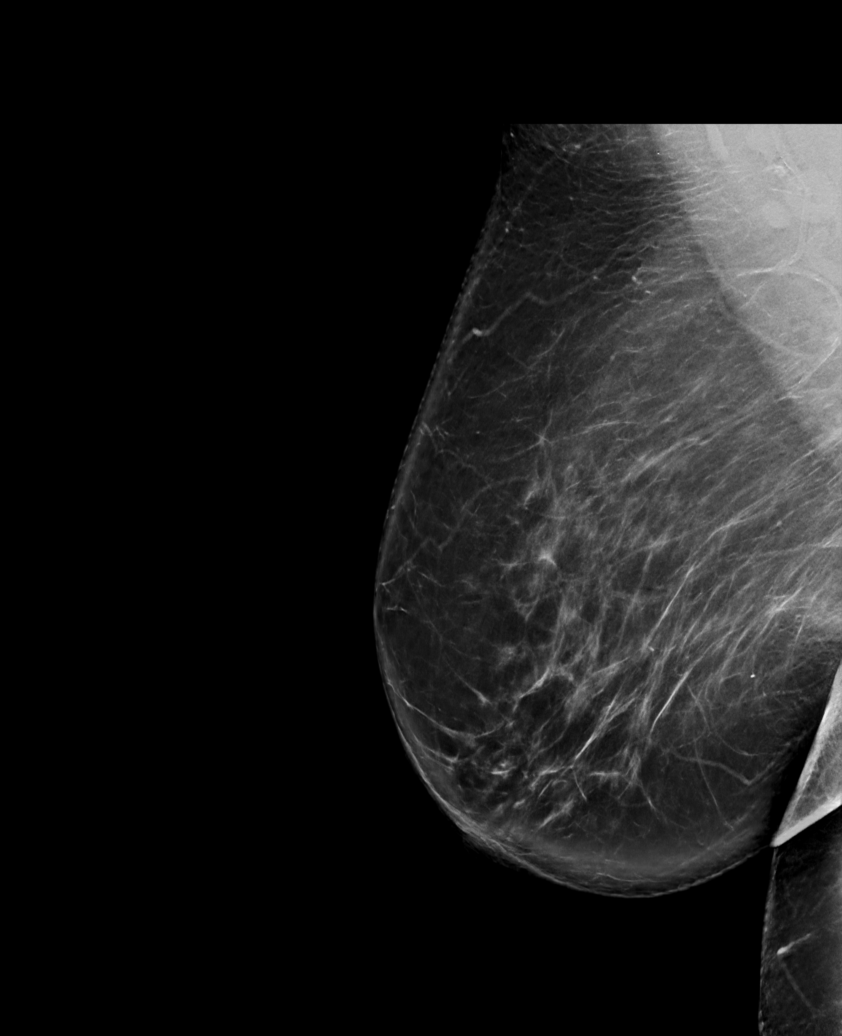

[L CC synth-2D]
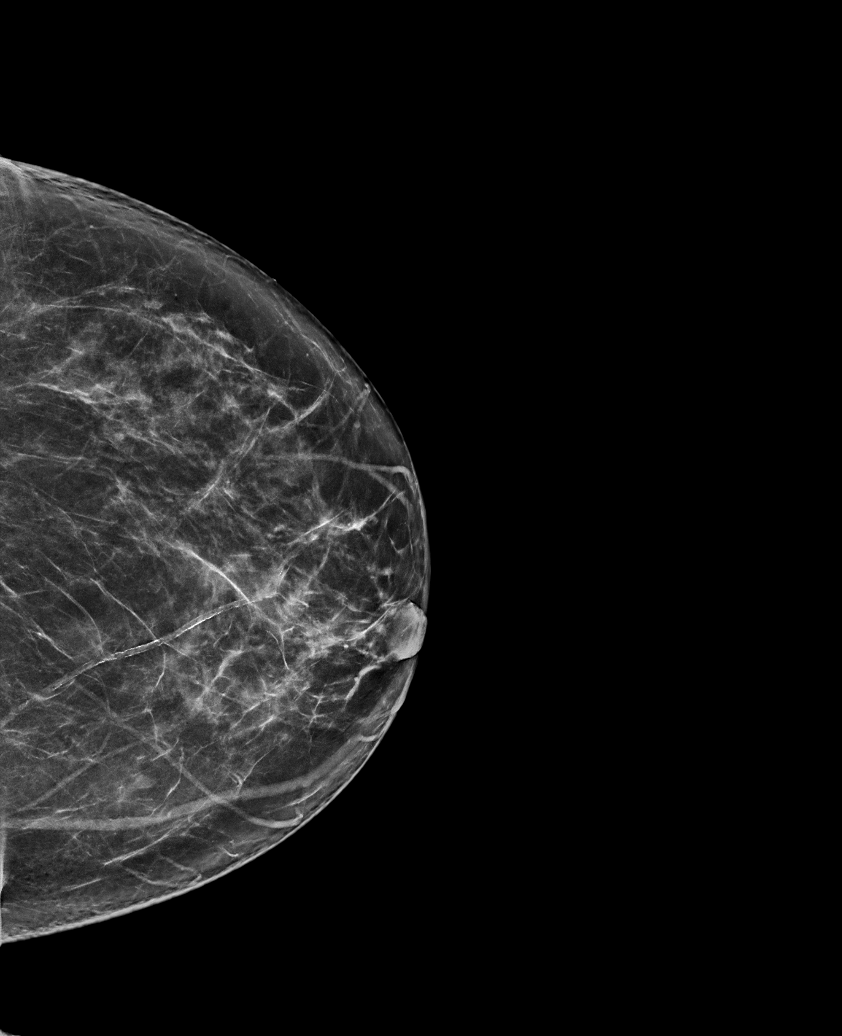

[R CC synth-2D]
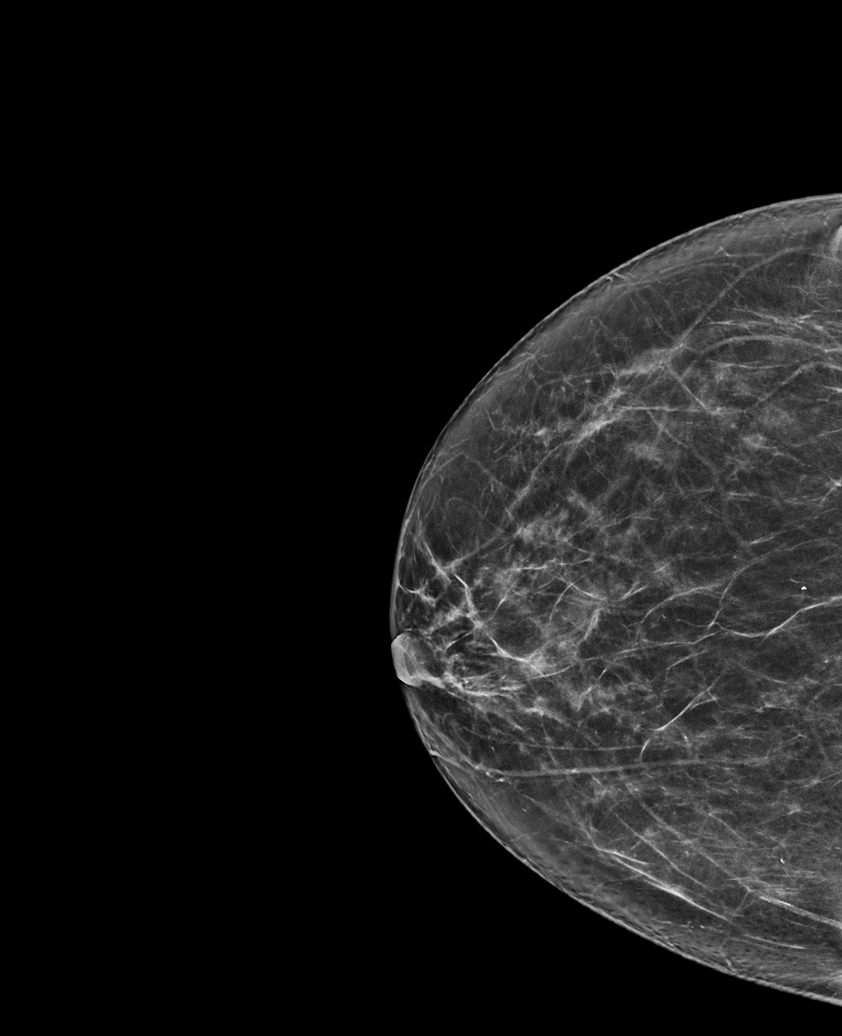

[6 of 36 positions shown; findings below may reference images not displayed]

ACR Breast Density Category b: There are scattered areas of
fibroglandular density.
FINDINGS: There are no findings suspicious for malignancy. Images were
processed with CAD.
IMPRESSION: No mammographic evidence of malignancy. A result letter of this
screening mammogram will be mailed directly to the patient.

RECOMMENDATION:
Screening mammogram in one year. (Code:[id number])

BI-RADS CATEGORY  1: Negative.

## 2020-09-02 ENCOUNTER — Other Ambulatory Visit: Payer: Self-pay

## 2020-09-02 ENCOUNTER — Encounter: Payer: 59 | Attending: Physical Medicine and Rehabilitation | Admitting: Physical Medicine and Rehabilitation

## 2020-09-02 ENCOUNTER — Encounter: Payer: Self-pay | Admitting: Physical Medicine and Rehabilitation

## 2020-09-02 VITALS — BP 118/77 | HR 79 | Temp 97.9°F | Ht 61.5 in | Wt 223.6 lb

## 2020-09-02 DIAGNOSIS — G894 Chronic pain syndrome: Secondary | ICD-10-CM | POA: Diagnosis present

## 2020-09-02 DIAGNOSIS — M7918 Myalgia, other site: Secondary | ICD-10-CM | POA: Diagnosis present

## 2020-09-02 DIAGNOSIS — M5126 Other intervertebral disc displacement, lumbar region: Secondary | ICD-10-CM | POA: Insufficient documentation

## 2020-09-02 DIAGNOSIS — R2681 Unsteadiness on feet: Secondary | ICD-10-CM | POA: Insufficient documentation

## 2020-09-02 MED ORDER — TIZANIDINE HCL 4 MG PO TABS: 4.0000 mg | ORAL_TABLET | Freq: Two times a day (BID) | ORAL | 5 refills | Status: DC | PRN
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
## 2020-09-02 NOTE — Patient Instructions (Signed)
Pt is a2yr old with severe depression- doesn't think has bipolar d/o per pt,allodynia,Lupus, fibromyalgia, and myofascial pain syndrome,and piriformis syndromeas well as bladder spasms and L4/5 disc issues with need for epidural- s/p epidural February 2022-.   1. Can return to taking Tramadol with risperidone, but do not take MORE than prescribed.    2. Can continue Tramadol- 50 mg 3x/day as needed- has refills til June  3. Has refills on gabapentin 300 mg TID- until July 2022.   4. Has refills on lyrica 100 mg BID- until July 2022.   5. Suggest for peace of mind  A home warranty- 2-10 home warranty?   6. For muscle spasms- Zanaflex/Tizanidine- 2-4 mg 2x/day AS NEEDED_ at least try at night time- can make you sleepy or drop BP slightly.   7. F/U in 8 weeks.

## 2020-10-16 ENCOUNTER — Telehealth: Payer: Self-pay

## 2020-10-16 NOTE — Telephone Encounter (Signed)
LMOM, patient scheduled 10/17/2020 at 11:00 for DEXA, [facility] has order, nothing else needed at this time.

## 2020-10-16 NOTE — Telephone Encounter (Signed)
Patient states she called [facility] to schedule her bone density scan that Dr. Estanislado Pandy ordered and was told she needed to contact our office to find out if the procedure has been approved through her insurance.  Patient was told once the approval is sent to their office she will be able to schedule the scan.  Patient requested a return call.

## 2020-10-17 ENCOUNTER — Encounter: Payer: Self-pay | Admitting: Rheumatology

## 2020-10-18 ENCOUNTER — Telehealth: Payer: Self-pay | Admitting: *Deleted

## 2020-10-18 NOTE — Telephone Encounter (Addendum)
Received DEXA results from [place]  [facility]  Date of Scan: 10/17/2020 Lowest T-score and lowest site measured: T-score -1.9, BMD 0835, AP Spine Significant changes in BMD and site measured (5% and above): n/a  Current Regimen: Vitamin D  Recommendation: Calcium, Vitamin D and resistive exercises, f/u DEXA in 2 years.  Reviewed by: Dr. Bo Merino  Next Appointment: 01/21/2021

## 2020-10-21 ENCOUNTER — Other Ambulatory Visit: Payer: Self-pay | Admitting: Rheumatology

## 2020-10-21 DIAGNOSIS — M3219 Other organ or system involvement in systemic lupus erythematosus: Secondary | ICD-10-CM

## 2020-10-22 NOTE — Telephone Encounter (Signed)
Last Visit: 08/20/2020 Next Visit: 01/21/2021 Labs: 08/20/2020, LFTs elevated. CBC is normal. Sed rate complements are normal. Some of the results are still pending. Please advise patient not to take any NSAIDs and avoid alcohol. Eye exam: 08/02/2020  Current Dose per office note 08/20/2020, PLQ 200 mg 1 tablet twice daily Monday to Friday   PR:FFMBW systemic lupus erythematosus with other organ involvement   Last Fill: 08/20/2020  Okay to refill Plaquenil?

## 2020-10-28 ENCOUNTER — Other Ambulatory Visit: Payer: Self-pay | Admitting: Physician Assistant

## 2020-10-28 DIAGNOSIS — R1314 Dysphagia, pharyngoesophageal phase: Secondary | ICD-10-CM

## 2020-11-01 ENCOUNTER — Other Ambulatory Visit: Payer: Self-pay

## 2020-11-01 ENCOUNTER — Encounter: Payer: 59 | Attending: Physical Medicine and Rehabilitation | Admitting: Physical Medicine and Rehabilitation

## 2020-11-01 ENCOUNTER — Encounter: Payer: Self-pay | Admitting: Physical Medicine and Rehabilitation

## 2020-11-01 VITALS — BP 121/75 | HR 81 | Temp 98.9°F | Ht 61.5 in | Wt 227.0 lb

## 2020-11-01 DIAGNOSIS — G894 Chronic pain syndrome: Secondary | ICD-10-CM | POA: Insufficient documentation

## 2020-11-01 DIAGNOSIS — M7918 Myalgia, other site: Secondary | ICD-10-CM | POA: Insufficient documentation

## 2020-11-01 DIAGNOSIS — M797 Fibromyalgia: Secondary | ICD-10-CM | POA: Diagnosis present

## 2020-11-01 DIAGNOSIS — M5126 Other intervertebral disc displacement, lumbar region: Secondary | ICD-10-CM | POA: Diagnosis present

## 2020-11-01 MED ORDER — GABAPENTIN 300 MG PO CAPS: 300.0000 mg | ORAL_CAPSULE | Freq: Four times a day (QID) | ORAL | 3 refills | Status: DC

## 2020-11-01 MED ORDER — TRAMADOL HCL 50 MG PO TABS: 50.0000 mg | ORAL_TABLET | Freq: Three times a day (TID) | ORAL | 5 refills | Status: DC | PRN

## 2020-11-01 MED ORDER — PREGABALIN 100 MG PO CAPS: 100.0000 mg | ORAL_CAPSULE | Freq: Two times a day (BID) | ORAL | 1 refills | Status: DC

## 2020-11-01 MED ORDER — LIDOCAINE 5 % EX OINT: 1.0000 "application " | TOPICAL_OINTMENT | Freq: Four times a day (QID) | CUTANEOUS | 5 refills | Status: DC | PRN

## 2020-11-01 MED ORDER — OXCARBAZEPINE 300 MG PO TABS: 300.0000 mg | ORAL_TABLET | Freq: Every day | ORAL | 5 refills | Status: DC

## 2020-11-01 NOTE — Patient Instructions (Signed)
Pt is a71yr old with severe depression- doesn't think has bipolar d/oper pt,allodynia,Lupus, fibromyalgia, and myofascial pain syndrome,and piriformis syndromeas well as bladder spasms and L4/5 disc issues with need for epidural- s/p epidural February 2022- Here for f/u on chronic pain - wasn't impressed with results- not worth getting injected per pt.    1. Refill Tramadol-  2. Doesn't need refill on Zanaflex-   3. Refilled Gabapentin and Lyrica- really wants to increase them, but her pharmacy refuses to refill if we increase either!  4. Lidocaine ointment 5% apply to feet/shoulders or elbow- 3 joints or body parts  at 1 time- but next time can put on different places 50 g- 5 refills- sent to  pharmacy.   5. Trileptal- 300 mg nightly x 1 week, then 600 mg nightly- for nerve pain- NEEDS TO HAVE PCP check BMP/sodium level at 1 month- can cause confusion if sodium level drops.   6/ F/U in 6 weeks.
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
## 2020-11-15 ENCOUNTER — Other Ambulatory Visit: Payer: 59

## 2020-12-13 ENCOUNTER — Encounter: Payer: Self-pay | Admitting: Physical Medicine and Rehabilitation

## 2020-12-13 ENCOUNTER — Other Ambulatory Visit: Payer: Self-pay

## 2020-12-13 ENCOUNTER — Encounter: Payer: 59 | Attending: Physical Medicine and Rehabilitation | Admitting: Physical Medicine and Rehabilitation

## 2020-12-13 VITALS — BP 138/83 | HR 84 | Temp 98.4°F | Ht 60.0 in | Wt 224.0 lb

## 2020-12-13 DIAGNOSIS — Z79891 Long term (current) use of opiate analgesic: Secondary | ICD-10-CM | POA: Diagnosis present

## 2020-12-13 DIAGNOSIS — G894 Chronic pain syndrome: Secondary | ICD-10-CM | POA: Diagnosis present

## 2020-12-13 DIAGNOSIS — Z5181 Encounter for therapeutic drug level monitoring: Secondary | ICD-10-CM | POA: Insufficient documentation

## 2020-12-13 MED ORDER — GABAPENTIN 400 MG PO CAPS: 400.0000 mg | ORAL_CAPSULE | Freq: Four times a day (QID) | ORAL | 3 refills | Status: DC

## 2020-12-13 NOTE — Patient Instructions (Signed)
Pt is a 60 yr old with severe depression- doesn't think has bipolar d/o per pt, allodynia, Lupus, fibromyalgia, and myofascial pain syndrome, and piriformis syndrome as well as bladder spasms and L4/5 disc issues with need for epidural - s/p epidural February 2022- Here for f/u on chronic pain - wasn't impressed with results- not worth getting injected per pt.     Here for f/u on chronic pain.   Will try to increase Gabapentin to 400 mg QID/4x/day for nerve pain- wrote on Rx OK to refill with Lyrica only increased slightly- since pain I pt's biggest limiter.  . Pt doesn't feel comfortable taking tramadol 3x/day- associates it with losing teeth- and she knows logically one thing, but her brain tells her they are related- Con't tramadol 3x/day AS NEEDED. Con't Lyrica 100 mg 2x/day. .  F/U in 3 months- f/u on FMS as well as chronic pain/neuropathic pain
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
## 2020-12-19 ENCOUNTER — Telehealth: Payer: Self-pay | Admitting: *Deleted

## 2020-12-19 LAB — TOXASSURE SELECT,+ANTIDEPR,UR

## 2020-12-19 NOTE — Telephone Encounter (Signed)
Urine drug screen for this encounter is consistent for prescribed medication 
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
## 2021-01-21 ENCOUNTER — Telehealth: Payer: Self-pay

## 2021-01-21 ENCOUNTER — Encounter: Payer: Self-pay | Admitting: Physician Assistant

## 2021-01-21 ENCOUNTER — Ambulatory Visit: Payer: 59 | Admitting: Physician Assistant

## 2021-01-21 ENCOUNTER — Telehealth: Payer: Self-pay | Admitting: *Deleted

## 2021-01-21 ENCOUNTER — Other Ambulatory Visit: Payer: Self-pay

## 2021-01-21 VITALS — BP 133/84 | HR 67 | Ht 61.5 in | Wt 223.0 lb

## 2021-01-21 DIAGNOSIS — R760 Raised antibody titer: Secondary | ICD-10-CM

## 2021-01-21 DIAGNOSIS — M5030 Other cervical disc degeneration, unspecified cervical region: Secondary | ICD-10-CM

## 2021-01-21 DIAGNOSIS — G252 Other specified forms of tremor: Secondary | ICD-10-CM

## 2021-01-21 DIAGNOSIS — M5136 Other intervertebral disc degeneration, lumbar region: Secondary | ICD-10-CM

## 2021-01-21 DIAGNOSIS — M3219 Other organ or system involvement in systemic lupus erythematosus: Secondary | ICD-10-CM | POA: Diagnosis not present

## 2021-01-21 DIAGNOSIS — R748 Abnormal levels of other serum enzymes: Secondary | ICD-10-CM

## 2021-01-21 DIAGNOSIS — M19042 Primary osteoarthritis, left hand: Secondary | ICD-10-CM

## 2021-01-21 DIAGNOSIS — K219 Gastro-esophageal reflux disease without esophagitis: Secondary | ICD-10-CM

## 2021-01-21 DIAGNOSIS — Z79899 Other long term (current) drug therapy: Secondary | ICD-10-CM

## 2021-01-21 DIAGNOSIS — Z8269 Family history of other diseases of the musculoskeletal system and connective tissue: Secondary | ICD-10-CM

## 2021-01-21 DIAGNOSIS — Z13820 Encounter for screening for osteoporosis: Secondary | ICD-10-CM

## 2021-01-21 DIAGNOSIS — R296 Repeated falls: Secondary | ICD-10-CM

## 2021-01-21 DIAGNOSIS — E669 Obesity, unspecified: Secondary | ICD-10-CM

## 2021-01-21 DIAGNOSIS — M19041 Primary osteoarthritis, right hand: Secondary | ICD-10-CM | POA: Diagnosis not present

## 2021-01-21 DIAGNOSIS — Z780 Asymptomatic menopausal state: Secondary | ICD-10-CM

## 2021-01-21 DIAGNOSIS — M8589 Other specified disorders of bone density and structure, multiple sites: Secondary | ICD-10-CM

## 2021-01-21 DIAGNOSIS — F5101 Primary insomnia: Secondary | ICD-10-CM

## 2021-01-21 DIAGNOSIS — K760 Fatty (change of) liver, not elsewhere classified: Secondary | ICD-10-CM

## 2021-01-21 DIAGNOSIS — M797 Fibromyalgia: Secondary | ICD-10-CM

## 2021-01-21 DIAGNOSIS — R5383 Other fatigue: Secondary | ICD-10-CM

## 2021-01-21 DIAGNOSIS — I10 Essential (primary) hypertension: Secondary | ICD-10-CM

## 2021-01-21 DIAGNOSIS — E559 Vitamin D deficiency, unspecified: Secondary | ICD-10-CM

## 2021-01-21 MED ORDER — HYDROXYCHLOROQUINE SULFATE 200 MG PO TABS: ORAL_TABLET | ORAL | 0 refills | Status: DC

## 2021-01-21 NOTE — Telephone Encounter (Signed)
I called Dr. Roma Kayser office to request most recent lab results. I left a message for the medical records department to request labs.

## 2021-01-21 NOTE — Addendum Note (Signed)
Addended by: Earnestine Mealing on: 01/21/2021 12:22 PM   Modules accepted: Orders

## 2021-01-21 NOTE — Telephone Encounter (Signed)
Labs received from:[facility]  Drawn on:10/23/2020  Reviewed by:Hazel Sams, PA-C  Labs drawn: TSH, Hgb A1C, CBC with diff, CMP, Ferritin, Vitamin D   Results:Hgb 11.6  HCT 35.5   RDW 15.8  Lymph % 15.1  Mono % 13.2  Lymph # 0.90  Glucose 69  Sodium 135  Ferritin 9.4  Vitamin D 16.2  Patient on PLQ 200 mg 1 tab po twice daily M-F.  Per Hazel Sams, PA-C patient was started on Vitamin D 50,000 units/wk by PCP according to the patient.

## 2021-01-23 ENCOUNTER — Telehealth: Payer: Self-pay | Admitting: *Deleted

## 2021-01-23 NOTE — Telephone Encounter (Signed)
Patient states she received a call from Hematology to schedule an appointment. Patient states she was not aware of the referral. Patient advised the referral was placed because A lupus anticoagulant was detected on 06/24/2018.  Beta-2 glycoprotein and anticardiolipin antibodies were positive on 04/22/2018.  Patient advised it was for further evaluation. Patient expressed understanding and states she will call to schedule that appointment.

## 2021-01-27 LAB — CBC WITH DIFFERENTIAL/PLATELET
Absolute Monocytes: 428 cells/uL (ref 200–950)
Basophils Absolute: 50 cells/uL (ref 0–200)
Basophils Relative: 1.1 %
Eosinophils Absolute: 59 cells/uL (ref 15–500)
Eosinophils Relative: 1.3 %
HCT: 41.5 % (ref 35.0–45.0)
Hemoglobin: 13.9 g/dL (ref 11.7–15.5)
Lymphs Abs: 995 cells/uL (ref 850–3900)
MCH: 30 pg (ref 27.0–33.0)
MCHC: 33.5 g/dL (ref 32.0–36.0)
MCV: 89.6 fL (ref 80.0–100.0)
MPV: 10.8 fL (ref 7.5–12.5)
Monocytes Relative: 9.5 %
Neutro Abs: 2970 cells/uL (ref 1500–7800)
Neutrophils Relative %: 66 %
Platelets: 222 10*3/uL (ref 140–400)
RBC: 4.63 10*6/uL (ref 3.80–5.10)
RDW: 15.2 % — ABNORMAL HIGH (ref 11.0–15.0)
Total Lymphocyte: 22.1 %
WBC: 4.5 10*3/uL (ref 3.8–10.8)

## 2021-01-27 LAB — COMPLETE METABOLIC PANEL WITH GFR
AG Ratio: 1.8 (calc) (ref 1.0–2.5)
ALT: 44 U/L — ABNORMAL HIGH (ref 6–29)
AST: 33 U/L (ref 10–35)
Albumin: 4.1 g/dL (ref 3.6–5.1)
Alkaline phosphatase (APISO): 58 U/L (ref 37–153)
BUN: 8 mg/dL (ref 7–25)
CO2: 26 mmol/L (ref 20–32)
Calcium: 8.9 mg/dL (ref 8.6–10.4)
Chloride: 100 mmol/L (ref 98–110)
Creat: 0.92 mg/dL (ref 0.50–1.05)
Globulin: 2.3 g/dL (calc) (ref 1.9–3.7)
Glucose, Bld: 134 mg/dL — ABNORMAL HIGH (ref 65–99)
Potassium: 3.6 mmol/L (ref 3.5–5.3)
Sodium: 135 mmol/L (ref 135–146)
Total Bilirubin: 0.5 mg/dL (ref 0.2–1.2)
Total Protein: 6.4 g/dL (ref 6.1–8.1)
eGFR: 71 mL/min/{1.73_m2} (ref >=60)

## 2021-01-27 LAB — LUPUS ANTICOAGULANT EVAL W/ REFLEX
PTT-LA Screen: 40 s (ref <=40)
dRVVT: 42 s (ref <=45)

## 2021-01-27 LAB — C3 AND C4
C3 Complement: 130 mg/dL (ref 83–193)
C4 Complement: 18 mg/dL (ref 15–57)

## 2021-01-27 LAB — PROTEIN / CREATININE RATIO, URINE
Creatinine, Urine: 60 mg/dL (ref 20–275)
Protein/Creat Ratio: 117 mg/g creat (ref 21–161)
Protein/Creatinine Ratio: 0.117 mg/mg creat (ref 0.021–0.161)
Total Protein, Urine: 7 mg/dL (ref 5–24)

## 2021-01-27 LAB — BETA-2 GLYCOPROTEIN ANTIBODIES
Beta-2 Glyco 1 IgA: 34.2 U/mL — ABNORMAL HIGH
Beta-2 Glyco 1 IgM: 57.7 U/mL — ABNORMAL HIGH
Beta-2 Glyco I IgG: 51.5 U/mL — ABNORMAL HIGH

## 2021-01-27 LAB — ANTI-DNA ANTIBODY, DOUBLE-STRANDED: ds DNA Ab: 5 IU/mL — ABNORMAL HIGH

## 2021-01-27 LAB — CARDIOLIPIN ANTIBODIES, IGG, IGM, IGA
Anticardiolipin IgA: 34.3 APL-U/mL — ABNORMAL HIGH
Anticardiolipin IgG: 17.6 GPL-U/mL
Anticardiolipin IgM: 52.5 MPL-U/mL — ABNORMAL HIGH

## 2021-01-27 LAB — SEDIMENTATION RATE: Sed Rate: 6 mm/h (ref 0–30)

## 2021-01-27 LAB — CK: Total CK: 304 U/L — ABNORMAL HIGH (ref 29–143)

## 2021-01-27 LAB — HYDROXYCHLOROQUINE,BLOOD: HYDROXYCHLOROQUINE, (B): 400 ng/mL

## 2021-01-27 NOTE — Progress Notes (Signed)
CBC stable.  Glucose is 134.  ALT is elevated-44.  AST WNL.  Rest of CMP WNL.  Please advise the patient to avoid tylenol, NSAIDs, and alcohol use.  Protein creatinine ratio WNL. dsDNA is 5. Complements WNL.  ESR WNL. Beta-2 glycoprotein and anticardiolipin antibodies remain positive.  Lupus anticoagulant not detected.  CK remains elevated but stable.   Hydroxychloroquine level is low-400.  Please clarify if she has been taking PLQ as prescribed?  Please discuss the importance of staying compliant with plaquenil 200 mg 1 tablet by mouth twice daily Monday through Friday.  We will recheck HCQ level with next lab work.   Please advise the patient to proceed with hematology referral placed at last visit.

## 2021-01-29 ENCOUNTER — Telehealth: Payer: Self-pay | Admitting: Physician Assistant

## 2021-01-29 NOTE — Telephone Encounter (Signed)
Received a new hem referral from [facility] for Lupus anticoagulant positive. Pt returned my call and has been scheduled on 9/7 at 9am per her preference. Aware to arrive 20 minutes early.

## 2021-02-25 ENCOUNTER — Ambulatory Visit
Admission: RE | Admit: 2021-02-25 | Discharge: 2021-02-25 | Disposition: A | Discharge status: Home / Self Care | Payer: 59 | Source: Ambulatory Visit | Attending: Physician Assistant | Admitting: Physician Assistant

## 2021-02-25 DIAGNOSIS — R1314 Dysphagia, pharyngoesophageal phase: Secondary | ICD-10-CM

## 2021-02-26 ENCOUNTER — Inpatient Hospital Stay: Payer: PPO

## 2021-02-26 ENCOUNTER — Inpatient Hospital Stay: Payer: PPO | Attending: Physician Assistant | Admitting: Physician Assistant

## 2021-02-26 ENCOUNTER — Other Ambulatory Visit: Payer: Self-pay

## 2021-02-26 ENCOUNTER — Encounter: Payer: Self-pay | Admitting: Physician Assistant

## 2021-02-26 DIAGNOSIS — Z7982 Long term (current) use of aspirin: Secondary | ICD-10-CM | POA: Diagnosis not present

## 2021-02-26 DIAGNOSIS — M797 Fibromyalgia: Secondary | ICD-10-CM | POA: Diagnosis not present

## 2021-02-26 DIAGNOSIS — Z803 Family history of malignant neoplasm of breast: Secondary | ICD-10-CM | POA: Insufficient documentation

## 2021-02-26 DIAGNOSIS — E559 Vitamin D deficiency, unspecified: Secondary | ICD-10-CM | POA: Insufficient documentation

## 2021-02-26 DIAGNOSIS — R1030 Lower abdominal pain, unspecified: Secondary | ICD-10-CM | POA: Insufficient documentation

## 2021-02-26 DIAGNOSIS — E785 Hyperlipidemia, unspecified: Secondary | ICD-10-CM | POA: Diagnosis not present

## 2021-02-26 DIAGNOSIS — Z79899 Other long term (current) drug therapy: Secondary | ICD-10-CM | POA: Diagnosis not present

## 2021-02-26 DIAGNOSIS — K219 Gastro-esophageal reflux disease without esophagitis: Secondary | ICD-10-CM | POA: Diagnosis not present

## 2021-02-26 DIAGNOSIS — M129 Arthropathy, unspecified: Secondary | ICD-10-CM | POA: Diagnosis not present

## 2021-02-26 DIAGNOSIS — R760 Raised antibody titer: Secondary | ICD-10-CM | POA: Diagnosis not present

## 2021-02-26 DIAGNOSIS — R110 Nausea: Secondary | ICD-10-CM | POA: Diagnosis not present

## 2021-02-26 DIAGNOSIS — R768 Other specified abnormal immunological findings in serum: Secondary | ICD-10-CM | POA: Insufficient documentation

## 2021-02-26 DIAGNOSIS — E7800 Pure hypercholesterolemia, unspecified: Secondary | ICD-10-CM | POA: Insufficient documentation

## 2021-02-26 DIAGNOSIS — M329 Systemic lupus erythematosus, unspecified: Secondary | ICD-10-CM | POA: Insufficient documentation

## 2021-02-26 DIAGNOSIS — K5900 Constipation, unspecified: Secondary | ICD-10-CM | POA: Diagnosis not present

## 2021-02-26 DIAGNOSIS — A6000 Herpesviral infection of urogenital system, unspecified: Secondary | ICD-10-CM | POA: Diagnosis not present

## 2021-02-26 DIAGNOSIS — D509 Iron deficiency anemia, unspecified: Secondary | ICD-10-CM | POA: Insufficient documentation

## 2021-02-26 DIAGNOSIS — K760 Fatty (change of) liver, not elsewhere classified: Secondary | ICD-10-CM | POA: Diagnosis not present
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
## 2021-02-27 DIAGNOSIS — F4323 Adjustment disorder with mixed anxiety and depressed mood: Secondary | ICD-10-CM | POA: Diagnosis not present

## 2021-03-19 ENCOUNTER — Encounter: Payer: Self-pay | Admitting: Physical Medicine and Rehabilitation

## 2021-03-19 ENCOUNTER — Other Ambulatory Visit: Payer: Self-pay

## 2021-03-19 ENCOUNTER — Telehealth: Payer: Self-pay | Admitting: Physical Medicine and Rehabilitation

## 2021-03-19 ENCOUNTER — Encounter: Payer: PPO | Attending: Physical Medicine and Rehabilitation | Admitting: Physical Medicine and Rehabilitation

## 2021-03-19 VITALS — BP 134/83 | HR 78 | Temp 98.9°F | Ht 61.5 in | Wt 220.8 lb

## 2021-03-19 DIAGNOSIS — M792 Neuralgia and neuritis, unspecified: Secondary | ICD-10-CM

## 2021-03-19 DIAGNOSIS — M329 Systemic lupus erythematosus, unspecified: Secondary | ICD-10-CM | POA: Diagnosis not present

## 2021-03-19 DIAGNOSIS — M7918 Myalgia, other site: Secondary | ICD-10-CM

## 2021-03-19 DIAGNOSIS — M5126 Other intervertebral disc displacement, lumbar region: Secondary | ICD-10-CM

## 2021-03-19 MED ORDER — GABAPENTIN 400 MG PO CAPS: 400.0000 mg | ORAL_CAPSULE | Freq: Four times a day (QID) | ORAL | 3 refills | Status: DC

## 2021-03-19 MED ORDER — PREGABALIN 100 MG PO CAPS: 100.0000 mg | ORAL_CAPSULE | Freq: Two times a day (BID) | ORAL | 1 refills | Status: DC

## 2021-03-19 MED ORDER — TRAMADOL HCL 50 MG PO TABS: 50.0000 mg | ORAL_TABLET | Freq: Three times a day (TID) | ORAL | 5 refills | Status: DC | PRN

## 2021-03-19 NOTE — Telephone Encounter (Signed)
Bilateral S1 TF 08/12/20. Please see note from OV with Dr. Dagoberto Ligas today which mentions this injection and results. Please advise.
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
## 2021-03-19 NOTE — Patient Instructions (Signed)
Pt is a 60 yr old with severe depression- doesn't think has bipolar d/o per pt, allodynia, Lupus, fibromyalgia, and myofascial pain syndrome, and piriformis syndrome as well as bladder spasms and L4/5 disc issues with need for epidural - s/p epidural February 2022- Here for f/u on chronic pain - wasn't impressed with results- not worth getting injected per pt.     Here for f/u on chronic pain  Taught/demonstrated to pt about myofascial release techniques. Also did ulnar release on LUE. Also showed her how to work arm up wall with palm flat on wall work up slowly over minutes. Over 4-5 minutes-  Will change Gabapentin to 400 mg  (1 in AM) in Am and 1200 (3 tabs)  mg at night- so wont have middle of day dose. See if helps falls and sedation.  Refilled Gabapentin, Lyrica and Tramadol- is due.   5.  Leg tremors could increase risk of falls? Doesn't want to decrease gabapentin dose or Lyrica at this time.   6. F/U in 3 months

## 2021-03-19 NOTE — Telephone Encounter (Signed)
Patient called needing an appointment with Dr Ernestina Patches for her back. The number to contact patient is [phone]

## 2021-03-20 NOTE — Telephone Encounter (Signed)
Patient states that last injection did not help much and she would like to try another one. Scheduled for OV.

## 2021-03-21 ENCOUNTER — Other Ambulatory Visit: Payer: Self-pay

## 2021-03-21 ENCOUNTER — Encounter: Payer: Self-pay | Admitting: Physical Medicine and Rehabilitation

## 2021-03-21 ENCOUNTER — Ambulatory Visit: Payer: PPO | Admitting: Physical Medicine and Rehabilitation

## 2021-03-21 VITALS — BP 131/67 | HR 74

## 2021-03-21 DIAGNOSIS — M5116 Intervertebral disc disorders with radiculopathy, lumbar region: Secondary | ICD-10-CM

## 2021-03-21 DIAGNOSIS — M5416 Radiculopathy, lumbar region: Secondary | ICD-10-CM

## 2021-03-21 DIAGNOSIS — G894 Chronic pain syndrome: Secondary | ICD-10-CM

## 2021-03-21 DIAGNOSIS — R2681 Unsteadiness on feet: Secondary | ICD-10-CM

## 2021-03-21 DIAGNOSIS — M5126 Other intervertebral disc displacement, lumbar region: Secondary | ICD-10-CM

## 2021-03-21 NOTE — Progress Notes (Signed)
Pain across low back- worse on left. No leg pain. Pain increases with activity. Does not notice that it is worse with sitting or standing. Sometimes has pain with lying down. Bilateral S1 TF 08/12/20. Patient states that she had more than 50% relief from the injection but it did not last long. States that she thought she should wait a while before getting another injection and that it might last longer this time. Numeric Pain Rating Scale and Functional Assessment Average Pain 10   In the last MONTH (on 0-10 scale) has pain interfered with the following?  1. General activity like being  able to carry out your everyday physical activities such as walking, climbing stairs, carrying groceries, or moving a chair?  Rating(9)

## 2021-03-21 NOTE — Progress Notes (Signed)
Terri Hurley - 60 y.o. female MRN [id number]  Date of birth: April 30, 1961  Office Visit Note: Visit Date: 03/21/2021 PCP: [name], MD Referred by: [name], MD  Subjective: Chief Complaint  Patient presents with   Lower Back - Pain   HPI: Terri Hurley is a 60 y.o. female who comes in today for evaluation of chronic, worsening and severe bilateral lower back pain radiating to buttocks and hips, left greater than right. Patient appears to be very anxious and uncomfortable today. Patient reports pain is exacerbated by activity, describes as sharp soreness, currently rates as 8 out of 10. Patient reports some pain relief with rest, Lidocaine patches, and stretching exercises. Patient's lumbar MRI from 2020 exhibits central disc protrusion at L5-S1, sequestered disc fragment noted to left subarticular recess at same level. No high grade spinal canal stenosis noted. Patient had bilateral S1 transforaminal epidural steroid injection in February, which she reports greater than 50% pain relief for a month. Patient did attend formal physical therapy at [facility] in 2021, which she reports did not provide significant pain relief. Patient reports severe pain is keeping her from performing tasks at home and is also causing her difficulty sleeping. Patient states ongoing history of frequent falls, denies falls within the last month. Patient denies focal weakness, numbness and tingling.   Patient's course is complicated by systemic lupus erythematosus, fibromyalgia, osteoarthritis, myofascial pain syndrome, unsteady gait and piriformis syndrome.   Review of Systems  Neurological:  Negative for tingling, sensory change, focal weakness and weakness.  All other systems reviewed and are negative. Otherwise per HPI.  Assessment & Plan: Visit Diagnoses:    ICD-10-CM   1. Lumbar radiculopathy  M54.16 Ambulatory referral to Physical Medicine Rehab    Ambulatory referral  to Neurosurgery    2. 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 Intervertebral disc disorders with radiculopathy, lumbar region  M51.16     4. Chronic pain syndrome  G89.4     5. Unsteady gait  R26.81        Plan: Findings:  Chronic, worsening and severe bilateral lower back pain radiating to buttock and hips, left greater than right. Patient's clinical presentation and exam are consistent with S1 nerve pattern as evidenced by pain and dysesthesias to proximal S1 nerve distribution. Patient continues to have excruciating pain despite good conservative therapies such as formal physical therapy, home exercise program, use of medications. We believe the next step is to perform diagnostic and hopefully therapeutic left S1 transforaminal epidural steroid injection under fluoroscopic guidance. We discussed patient's lumbar MRI from 2020 in detail today using images and spine model. We talked about issues regarding L5-S1 disc extrusion with a sequestered fragment on the left and also discussed treatment options such as surgical intervention for sequestered disc fragment at this same level. We feel that it is best practice to refer patient to neurosurgeon at this time to explore possible treatment options, we did place referral to Dr. Sherley Bounds at [facility]. We feel confident that we can get patient in quickly for injection. Patient encouraged to continue conservative therapies as tolerated. No red flag symptoms noted upon exam today.    Meds & Orders: No orders of the defined types were placed in this encounter.   Orders Placed This Encounter  Procedures   Ambulatory referral to Physical Medicine Rehab   Ambulatory referral to Neurosurgery  Follow-up: Return in about 1 week (around 03/28/2021) for left S1 transforaminal epidural steroid injection.   Procedures: No procedures performed      Clinical  History: MRI LUMBAR SPINE WITHOUT CONTRAST   TECHNIQUE: Multiplanar, multisequence MR imaging of the lumbar spine was performed. No intravenous contrast was administered.   COMPARISON:  Two views lumbar spine 01/10/2019. MRI lumbar spine 11/05/2011.   FINDINGS: Segmentation:  Standard.   Alignment: No listhesis. Convex right scoliosis with the apex at approximately L3-4.   Vertebrae:  No fracture, evidence of discitis, or bone lesion.   Conus medullaris and cauda equina: Conus extends to the L1 level. Conus and cauda equina appear normal.   Paraspinal and other soft tissues: Negative.   Disc levels:   T11-12: Negative.   T12-L1: Negative.   L1-2: Shallow disc bulge without stenosis.   L2-3: Shallow disc bulge without stenosis.   L3-4: Shallow disc bulge without stenosis.   L4-5: Shallow disc bulge and mild facet degenerative disease. No stenosis.   L5-S1: The patient has a new central disc protrusion indenting the ventral thecal sac. A sequestered disc fragment measuring 0.9 cm craniocaudal x 0.7 cm AP x 0.4 cm transverse is seen in the left subarticular recess inferior to the disc interspace. The fragment contacts the left S1 root without compression or displacement. The foramina are widely patent.   IMPRESSION: New central disc protrusion at L5-S1 indents the ventral thecal sac but the central canal appears open. There is also a new sequestered disc fragment inferior to the L5-S1 disc interspace in the left subarticular recess. The fragment contacts the left S1 root without compression or displacement.   Mild disc bulging L1-2 to L4-5 without stenosis.     Electronically Signed   By: Inge Rise M.D.   On: 03/29/2019 15:03   She reports that she has never smoked. She has never used smokeless tobacco. No results for input(s): HGBA1C, LABURIC in the last 8760 hours.  Objective:  VS:  HT:    WT:   BMI:     BP:131/67  HR:74bpm  TEMP: ( )  RESP:   Physical Exam HENT:     Head: Normocephalic and atraumatic.     Right Ear: Tympanic membrane normal.     Left Ear: Tympanic membrane normal.     Nose: Nose normal.     Mouth/Throat:     Mouth: Mucous membranes are moist.  Eyes:     Pupils: Pupils are equal, round, and reactive to light.  Cardiovascular:     Rate and Rhythm: Normal rate.     Pulses: Normal pulses.  Pulmonary:     Effort: Pulmonary effort is normal.  Abdominal:     General: Abdomen is flat. There is no distension.  Musculoskeletal:        General: Tenderness present.     Cervical back: Normal range of motion.     Comments: Pt is slow to rise from seated position to standing. Good lumbar range of motion. Strong distal strength without clonus, no pain upon palpation of greater trochanters. Sensation intact bilaterally. Pain and dysesthesia noted to proximal S1 distribution. Walks independently, gait steady.     Skin:    General: Skin is warm and dry.     Capillary Refill: Capillary refill takes less than 2 seconds.  Neurological:     General: No focal deficit present.     Mental Status: She is alert.  Psychiatric:        Mood and  Affect: Mood normal.    Ortho Exam  Imaging: No results found.  Past Medical/Family/Surgical/Social History: Medications & Allergies reviewed per EMR, new medications updated. Patient Active Problem List   Diagnosis Date Noted   Nerve pain 03/19/2021   Antiphospholipid antibody positive 02/26/2021   Unsteady gait 03/04/2020   Vertigo 03/04/2020   Severe episode of recurrent major depressive disorder, without psychotic features ([place]) 01/17/2020   Chronic pain syndrome 09/27/2019   Encounter for therapeutic drug monitoring 09/27/2019   Encounter for monitoring opioid maintenance therapy 09/27/2019   Bladder spasms 06/21/2019   Protrusion of lumbar intervertebral disc 05/16/2019   Allodynia 03/23/2019   Fibromyalgia 03/23/2019   Myofascial pain dysfunction syndrome 03/23/2019    SLE (systemic lupus erythematosus) ([place])    Primary osteoarthritis of both hands 10/01/2017   DDD (degenerative disc disease), cervical 08/23/2017   DDD (degenerative disc disease), lumbar 08/23/2017   Essential hypertension 01/28/2016   Incontinence of urine 12/26/2010   Depression    GERD (gastroesophageal reflux disease)    Fatty liver disease, nonalcoholic    HNP (herniated nucleus pulposus), cervical    Recurrent genital HSV (herpes simplex virus) infection    Plantar fasciitis    Elevated liver enzymes 12/12/2010   Vitamin D deficiency 12/12/2010   Past Medical History:  Diagnosis Date   Arthritis    low back, hip, hands   Depression    Sees [name], NP @ [facility]  counseling center.Marland Kitchen Has h/o hospitalization   Fatty liver disease, nonalcoholic    clinical diagnosis by endocrinologist   Fibromyalgia    GERD (gastroesophageal reflux disease)    controlled with PPI therapy   Hepatitis unknown type    High cholesterol    No medical therapy. Last lab March '12  LDL 91, T. Chol 170. Minimal elevation in '08   HNP (herniated nucleus pulposus), cervical    C6-7. Has had PT, no surgery   Iron deficiency anemia    per patient   Plantar fasciitis    Recurrent genital HSV (herpes simplex virus) infection    on daily suppression with acyclovir   SLE (systemic lupus erythematosus) ([facility])    sees rheum, on plaquenil   Urinary incontinence    Vitamin D deficiency    lab '09  Vit D = 19   Family History  Problem Relation Age of Onset   Heart disease Mother 55       AMI   Diabetes Mother        peripheral neuropathy   Hypertension Mother    Hyperlipidemia Mother    Mental illness Mother        depression and anxiety   Gout Mother    Deep vein thrombosis Mother    Diabetes Father    Heart disease Father    Hyperlipidemia Father    Mental illness Father        depression and anxiety   Cancer Sister 69       breast cancer   Mental illness Sister    Asthma  Sister    Thyroid disease Sister    COPD Sister    Mental illness Sister    Mental illness Sister    Heart disease Brother        arrythmia, [place]   Hyperlipidemia Brother    Mental illness Brother    COPD Brother    Stroke Brother    Hyperlipidemia Brother    Hypertension Brother    Mental illness Brother  Mental illness Brother    Mental illness Brother    GER disease Son    Past Surgical History:  Procedure Laterality Date   CHOLECYSTECTOMY     INCONTINENCE SURGERY  2009   SPINE SURGERY  05/2014   Cervical spine surgery C4-5, C5-6.  [place].   TOTAL ABDOMINAL HYSTERECTOMY W/ BILATERAL SALPINGOOPHORECTOMY  2009   with repair of cystocele and rectocele    Social History   Occupational History   Occupation: HAIR DRESSER  Tobacco Use   Smoking status: Never   Smokeless tobacco: Never  Vaping Use   Vaping Use: Never used  Substance and Sexual Activity   Alcohol use: No   Drug use: No   Sexual activity: Never    Birth control/protection: Surgical, Post-menopausal

## 2021-03-24 DIAGNOSIS — K219 Gastro-esophageal reflux disease without esophagitis: Secondary | ICD-10-CM | POA: Diagnosis not present

## 2021-03-24 DIAGNOSIS — K6389 Other specified diseases of intestine: Secondary | ICD-10-CM | POA: Diagnosis not present

## 2021-03-24 DIAGNOSIS — K317 Polyp of stomach and duodenum: Secondary | ICD-10-CM | POA: Diagnosis not present

## 2021-03-24 DIAGNOSIS — K648 Other hemorrhoids: Secondary | ICD-10-CM | POA: Diagnosis not present

## 2021-03-24 DIAGNOSIS — K31A19 Gastric intestinal metaplasia without dysplasia, unspecified site: Secondary | ICD-10-CM | POA: Diagnosis not present

## 2021-03-24 DIAGNOSIS — D509 Iron deficiency anemia, unspecified: Secondary | ICD-10-CM | POA: Diagnosis not present

## 2021-03-24 DIAGNOSIS — D122 Benign neoplasm of ascending colon: Secondary | ICD-10-CM | POA: Diagnosis not present

## 2021-03-24 DIAGNOSIS — K259 Gastric ulcer, unspecified as acute or chronic, without hemorrhage or perforation: Secondary | ICD-10-CM | POA: Diagnosis not present

## 2021-03-27 ENCOUNTER — Encounter: Payer: Self-pay | Admitting: Physical Medicine and Rehabilitation

## 2021-03-27 ENCOUNTER — Other Ambulatory Visit: Payer: Self-pay

## 2021-03-27 ENCOUNTER — Ambulatory Visit: Payer: Self-pay

## 2021-03-27 ENCOUNTER — Ambulatory Visit (INDEPENDENT_AMBULATORY_CARE_PROVIDER_SITE_OTHER): Payer: PPO | Admitting: Physical Medicine and Rehabilitation

## 2021-03-27 VITALS — BP 148/84 | HR 89

## 2021-03-27 DIAGNOSIS — K317 Polyp of stomach and duodenum: Secondary | ICD-10-CM | POA: Diagnosis not present

## 2021-03-27 DIAGNOSIS — M5416 Radiculopathy, lumbar region: Secondary | ICD-10-CM

## 2021-03-27 DIAGNOSIS — D122 Benign neoplasm of ascending colon: Secondary | ICD-10-CM | POA: Diagnosis not present

## 2021-03-27 MED ORDER — METHYLPREDNISOLONE ACETATE 80 MG/ML IJ SUSP
80.0000 mg | Freq: Once | INTRAMUSCULAR | Status: AC
  Administered 2021-03-27: 80 mg

## 2021-03-27 NOTE — Progress Notes (Signed)
Pt state lower back pain. Pt state walking, standing and sitting makes the pain worse. Pt state she takes pain meds to help ease her pain. Pt has hx of inj on 08/12/20 pt state it helped for a month.  Numeric Pain Rating Scale and Functional Assessment Average Pain 8   In the last MONTH (on 0-10 scale) has pain interfered with the following?  1. General activity like being  able to carry out your everyday physical activities such as walking, climbing stairs, carrying groceries, or moving a chair?  Rating(10)   +Driver, -BT, -Dye Allergies.

## 2021-03-27 NOTE — Patient Instructions (Signed)

## 2021-03-31 NOTE — Progress Notes (Signed)
Terri Hurley - 60 y.o. female MRN [id number]  Date of birth: [birth year]  Office Visit Note: Visit Date: 03/27/2021 PCP: [name], MD Referred by: [name], MD  Subjective: Chief Complaint  Patient presents with   Lower Back - Pain   HPI:  Terri Hurley is a 60 y.o. female who comes in today for planned Left S1-2 Lumbar Transforaminal epidural steroid injection with fluoroscopic guidance.  The patient has failed conservative care including home exercise, medications, time and activity modification.  This injection will be diagnostic and hopefully therapeutic.  Please see requesting physician notes for further details and justification.   ROS Otherwise per HPI.  Assessment & Plan: Visit Diagnoses:    ICD-10-CM   1. Lumbar radiculopathy  M54.16 XR C-ARM NO REPORT    Epidural Steroid injection    methylPREDNISolone acetate (DEPO-MEDROL) injection 80 mg      Plan: No additional findings.   Meds & Orders:  Meds ordered this encounter  Medications   methylPREDNISolone acetate (DEPO-MEDROL) injection 80 mg    Orders Placed This Encounter  Procedures   XR C-ARM NO REPORT   Epidural Steroid injection    Follow-up: Return if symptoms worsen or fail to improve.   Procedures: No procedures performed  S1 Lumbosacral Transforaminal Epidural Steroid Injection - Sub-Pedicular Approach with Fluoroscopic Guidance   Patient: Terri Hurley      Date of Birth: Dec 09, 1960 MRN: [id number] PCP: [name], MD      Visit Date: 03/27/2021   Universal Protocol:    Date/Time: 10/10/225:56 AM  Consent Given By: the patient  Position:  PRONE  Additional Comments: Vital signs were monitored before and after the procedure. Patient was prepped and draped in the usual sterile fashion. The correct patient, procedure, and site was verified.   Injection Procedure Details:  Procedure Site One Meds Administered:  Meds ordered this encounter  Medications    methylPREDNISolone acetate (DEPO-MEDROL) injection 80 mg    Laterality: Left  Location/Site:  S1 Foramen   Needle size: 22 ga.  Needle type: Spinal  Needle Placement: Transforaminal  Findings:   -Comments: Excellent flow of contrast along the nerve, nerve root and into the epidural space.  Epidurogram: Contrast epidurogram showed no nerve root cut off or restricted flow pattern.  Procedure Details: After squaring off the sacral end-plate to get a true AP view, the C-arm was positioned so that the best possible view of the S1 foramen was visualized. The soft tissues overlying this structure were infiltrated with 2-3 ml. of 1% Lidocaine without Epinephrine.    The spinal needle was inserted toward the target using a "trajectory" view along the fluoroscope beam.  Under AP and lateral visualization, the needle was advanced so it did not puncture dura. Biplanar projections were used to confirm position. Aspiration was confirmed to be negative for CSF and/or blood. A 1-2 ml. volume of Isovue-250 was injected and flow of contrast was noted at each level. Radiographs were obtained for documentation purposes.   After attaining the desired flow of contrast documented above, a 0.5 to 1.0 ml test dose of 0.25% Marcaine was injected into each respective transforaminal space.  The patient was observed for 90 seconds post injection.  After no sensory deficits were reported, and normal lower extremity motor function was noted,   the above injectate was administered so that equal amounts of the injectate were placed at each foramen (level) into the transforaminal epidural space.   Additional Comments:  The patient  tolerated the procedure well Dressing: Band-Aid with 2 x 2 sterile gauze    Post-procedure details: Patient was observed during the procedure. Post-procedure instructions were reviewed.  Patient left the clinic in stable condition.   Clinical History: MRI LUMBAR SPINE WITHOUT  CONTRAST   TECHNIQUE: Multiplanar, multisequence MR imaging of the lumbar spine was performed. No intravenous contrast was administered.   COMPARISON:  Two views lumbar spine 01/10/2019. MRI lumbar spine 11/05/2011.   FINDINGS: Segmentation:  Standard.   Alignment: No listhesis. Convex right scoliosis with the apex at approximately L3-4.   Vertebrae:  No fracture, evidence of discitis, or bone lesion.   Conus medullaris and cauda equina: Conus extends to the L1 level. Conus and cauda equina appear normal.   Paraspinal and other soft tissues: Negative.   Disc levels:   T11-12: Negative.   T12-L1: Negative.   L1-2: Shallow disc bulge without stenosis.   L2-3: Shallow disc bulge without stenosis.   L3-4: Shallow disc bulge without stenosis.   L4-5: Shallow disc bulge and mild facet degenerative disease. No stenosis.   L5-S1: The patient has a new central disc protrusion indenting the ventral thecal sac. A sequestered disc fragment measuring 0.9 cm craniocaudal x 0.7 cm AP x 0.4 cm transverse is seen in the left subarticular recess inferior to the disc interspace. The fragment contacts the left S1 root without compression or displacement. The foramina are widely patent.   IMPRESSION: New central disc protrusion at L5-S1 indents the ventral thecal sac but the central canal appears open. There is also a new sequestered disc fragment inferior to the L5-S1 disc interspace in the left subarticular recess. The fragment contacts the left S1 root without compression or displacement.   Mild disc bulging L1-2 to L4-5 without stenosis.     Electronically Signed   By: Inge Rise M.D.   On: 03/29/2019 15:03     Objective:  VS:  HT:    WT:   BMI:     BP:(!) 148/84  HR:89bpm  TEMP: ( )  RESP:  Physical Exam Vitals and nursing note reviewed.  Constitutional:      General: She is not in acute distress.    Appearance: Normal appearance. She is not ill-appearing.   HENT:     Head: Normocephalic and atraumatic.     Right Ear: External ear normal.     Left Ear: External ear normal.  Eyes:     Extraocular Movements: Extraocular movements intact.  Cardiovascular:     Rate and Rhythm: Normal rate.     Pulses: Normal pulses.  Pulmonary:     Effort: Pulmonary effort is normal. No respiratory distress.  Abdominal:     General: There is no distension.     Palpations: Abdomen is soft.  Musculoskeletal:        General: Tenderness present.     Cervical back: Neck supple.     Right lower leg: No edema.     Left lower leg: No edema.     Comments: Patient has good distal strength with no pain over the greater trochanters.  No clonus or focal weakness.  Skin:    Findings: No erythema, lesion or rash.  Neurological:     General: No focal deficit present.     Mental Status: She is alert and oriented to person, place, and time.     Sensory: No sensory deficit.     Motor: No weakness or abnormal muscle tone.     Coordination: Coordination normal.  Psychiatric:        Mood and Affect: Mood normal.        Behavior: Behavior normal.     Imaging: No results found.

## 2021-03-31 NOTE — Procedures (Signed)
S1 Lumbosacral Transforaminal Epidural Steroid Injection - Sub-Pedicular Approach with Fluoroscopic Guidance   Patient: Terri Hurley      Date of Birth: July 18, 1960 MRN: [id number] PCP: [name], MD      Visit Date: 03/27/2021   Universal Protocol:    Date/Time: 10/10/225:56 AM  Consent Given By: the patient  Position:  PRONE  Additional Comments: Vital signs were monitored before and after the procedure. Patient was prepped and draped in the usual sterile fashion. The correct patient, procedure, and site was verified.   Injection Procedure Details:  Procedure Site One Meds Administered:  Meds ordered this encounter  Medications   methylPREDNISolone acetate (DEPO-MEDROL) injection 80 mg    Laterality: Left  Location/Site:  S1 Foramen   Needle size: 22 ga.  Needle type: Spinal  Needle Placement: Transforaminal  Findings:   -Comments: Excellent flow of contrast along the nerve, nerve root and into the epidural space.  Epidurogram: Contrast epidurogram showed no nerve root cut off or restricted flow pattern.  Procedure Details: After squaring off the sacral end-plate to get a true AP view, the C-arm was positioned so that the best possible view of the S1 foramen was visualized. The soft tissues overlying this structure were infiltrated with 2-3 ml. of 1% Lidocaine without Epinephrine.    The spinal needle was inserted toward the target using a "trajectory" view along the fluoroscope beam.  Under AP and lateral visualization, the needle was advanced so it did not puncture dura. Biplanar projections were used to confirm position. Aspiration was confirmed to be negative for CSF and/or blood. A 1-2 ml. volume of Isovue-250 was injected and flow of contrast was noted at each level. Radiographs were obtained for documentation purposes.   After attaining the desired flow of contrast documented above, a 0.5 to 1.0 ml test dose of 0.25% Marcaine was injected into each  respective transforaminal space.  The patient was observed for 90 seconds post injection.  After no sensory deficits were reported, and normal lower extremity motor function was noted,   the above injectate was administered so that equal amounts of the injectate were placed at each foramen (level) into the transforaminal epidural space.   Additional Comments:  The patient tolerated the procedure well Dressing: Band-Aid with 2 x 2 sterile gauze    Post-procedure details: Patient was observed during the procedure. Post-procedure instructions were reviewed.  Patient left the clinic in stable condition.

## 2021-04-02 DIAGNOSIS — F4323 Adjustment disorder with mixed anxiety and depressed mood: Secondary | ICD-10-CM | POA: Diagnosis not present

## 2021-04-10 ENCOUNTER — Other Ambulatory Visit: Payer: Self-pay | Admitting: Gastroenterology

## 2021-04-16 DIAGNOSIS — F4323 Adjustment disorder with mixed anxiety and depressed mood: Secondary | ICD-10-CM | POA: Diagnosis not present

## 2021-04-17 ENCOUNTER — Other Ambulatory Visit: Payer: Self-pay | Admitting: Physician Assistant

## 2021-04-17 DIAGNOSIS — M3219 Other organ or system involvement in systemic lupus erythematosus: Secondary | ICD-10-CM

## 2021-04-18 NOTE — Telephone Encounter (Signed)
Next Visit: 06/26/2021  Last Visit: 01/21/2021  Labs: 01/21/2021 CBC stable.  Glucose is 134.  ALT is elevated-44.  AST WNL.  Rest of CMP WNL.  Eye exam: 08/02/2020 normal.    Current Dose per office note 01/21/2021: Plaquenil 200 mg 1 tablet twice daily Monday to Friday  CX:[id number] systemic lupus erythematosus with other organ involvement   Last Fill: 01/21/2021  Okay to refill Plaquenil?

## 2021-04-29 DIAGNOSIS — M545 Low back pain, unspecified: Secondary | ICD-10-CM | POA: Diagnosis not present

## 2021-04-29 DIAGNOSIS — G8929 Other chronic pain: Secondary | ICD-10-CM | POA: Diagnosis not present

## 2021-04-30 DIAGNOSIS — F4323 Adjustment disorder with mixed anxiety and depressed mood: Secondary | ICD-10-CM | POA: Diagnosis not present

## 2021-05-06 DIAGNOSIS — I10 Essential (primary) hypertension: Secondary | ICD-10-CM | POA: Diagnosis not present

## 2021-05-06 DIAGNOSIS — R9431 Abnormal electrocardiogram [ECG] [EKG]: Secondary | ICD-10-CM | POA: Diagnosis not present

## 2021-05-06 DIAGNOSIS — M329 Systemic lupus erythematosus, unspecified: Secondary | ICD-10-CM | POA: Diagnosis not present

## 2021-05-06 DIAGNOSIS — Z1159 Encounter for screening for other viral diseases: Secondary | ICD-10-CM | POA: Diagnosis not present

## 2021-05-06 DIAGNOSIS — D500 Iron deficiency anemia secondary to blood loss (chronic): Secondary | ICD-10-CM | POA: Diagnosis not present

## 2021-05-06 DIAGNOSIS — G894 Chronic pain syndrome: Secondary | ICD-10-CM | POA: Diagnosis not present

## 2021-05-06 DIAGNOSIS — F39 Unspecified mood [affective] disorder: Secondary | ICD-10-CM | POA: Diagnosis not present

## 2021-05-06 DIAGNOSIS — Z0000 Encounter for general adult medical examination without abnormal findings: Secondary | ICD-10-CM | POA: Diagnosis not present

## 2021-05-06 DIAGNOSIS — D6862 Lupus anticoagulant syndrome: Secondary | ICD-10-CM | POA: Diagnosis not present

## 2021-05-06 DIAGNOSIS — K219 Gastro-esophageal reflux disease without esophagitis: Secondary | ICD-10-CM | POA: Diagnosis not present

## 2021-05-06 DIAGNOSIS — Z23 Encounter for immunization: Secondary | ICD-10-CM | POA: Diagnosis not present

## 2021-05-06 DIAGNOSIS — Z13220 Encounter for screening for lipoid disorders: Secondary | ICD-10-CM | POA: Diagnosis not present

## 2021-05-06 DIAGNOSIS — E559 Vitamin D deficiency, unspecified: Secondary | ICD-10-CM | POA: Diagnosis not present

## 2021-05-06 DIAGNOSIS — A6004 Herpesviral vulvovaginitis: Secondary | ICD-10-CM | POA: Diagnosis not present

## 2021-05-06 DIAGNOSIS — R7303 Prediabetes: Secondary | ICD-10-CM | POA: Diagnosis not present

## 2021-05-09 ENCOUNTER — Other Ambulatory Visit: Payer: Self-pay | Admitting: Family Medicine

## 2021-05-09 DIAGNOSIS — Z1231 Encounter for screening mammogram for malignant neoplasm of breast: Secondary | ICD-10-CM

## 2021-05-14 DIAGNOSIS — R29898 Other symptoms and signs involving the musculoskeletal system: Secondary | ICD-10-CM | POA: Diagnosis not present

## 2021-05-14 DIAGNOSIS — M545 Low back pain, unspecified: Secondary | ICD-10-CM | POA: Diagnosis not present

## 2021-05-14 DIAGNOSIS — M4126 Other idiopathic scoliosis, lumbar region: Secondary | ICD-10-CM | POA: Diagnosis not present

## 2021-05-20 DIAGNOSIS — M545 Low back pain, unspecified: Secondary | ICD-10-CM | POA: Diagnosis not present

## 2021-05-28 DIAGNOSIS — F4323 Adjustment disorder with mixed anxiety and depressed mood: Secondary | ICD-10-CM | POA: Diagnosis not present

## 2021-05-29 ENCOUNTER — Ambulatory Visit: Payer: PPO | Admitting: Cardiology

## 2021-05-29 ENCOUNTER — Encounter: Payer: Self-pay | Admitting: Cardiology

## 2021-05-29 ENCOUNTER — Other Ambulatory Visit: Payer: Self-pay

## 2021-05-29 VITALS — BP 134/84 | HR 75 | Temp 97.5°F | Resp 16 | Ht 61.5 in | Wt 221.0 lb

## 2021-05-29 DIAGNOSIS — R9431 Abnormal electrocardiogram [ECG] [EKG]: Secondary | ICD-10-CM | POA: Diagnosis not present

## 2021-05-29 DIAGNOSIS — R0602 Shortness of breath: Secondary | ICD-10-CM | POA: Diagnosis not present

## 2021-05-29 DIAGNOSIS — E7800 Pure hypercholesterolemia, unspecified: Secondary | ICD-10-CM

## 2021-05-29 DIAGNOSIS — I10 Essential (primary) hypertension: Secondary | ICD-10-CM | POA: Diagnosis not present

## 2021-05-29 DIAGNOSIS — Z6841 Body Mass Index (BMI) 40.0 and over, adult: Secondary | ICD-10-CM | POA: Diagnosis not present

## 2021-05-29 MED ORDER — ATORVASTATIN CALCIUM 10 MG PO TABS: 10.0000 mg | ORAL_TABLET | Freq: Every day | ORAL | 2 refills | Status: DC

## 2021-05-29 MED ORDER — AMLODIPINE BESYLATE 10 MG PO TABS: 10.0000 mg | ORAL_TABLET | Freq: Every day | ORAL | 3 refills | Status: DC

## 2021-05-29 NOTE — Progress Notes (Signed)
Primary Physician/Referring:  [name], MD  Patient ID: Terri Hurley, female    DOB: [birth year], 60 y.o.   MRN: [id number]  Chief Complaint  Patient presents with   Shortness of Breath   Abnormal ECG   New Patient (Initial Visit)    Referred by [name], MD   HPI:    Terri Hurley  is a 60 y.o. Caucasian female patient with primary hypertension, fibromyalgia, lupus anticoagulant positive and on chronic Plaquenil, GERD, esophageal dysmotility, restless leg syndrome, mixed hyperlipidemia, referred to me for evaluation of dyspnea on exertion and abnormal EKG.  Patient has chronic dyspnea on exertion.  Her activity is markedly limited, patient states that due to fibromyalgia and generalized aches and pains all over her body she does not do much.  She lives with her brother and her brother helps taking care of household chores as well.  She does not cook.  Denies leg edema, denies any painful swelling of lower extremity, no hemoptysis.  Denies chest pain with exertion activity although her activity is very low.  Past Medical History:  Diagnosis Date   Arthritis    low back, hip, hands   Depression    Sees [name], NP @ [facility]  counseling center.Marland Kitchen Has h/o hospitalization   Fatty liver disease, nonalcoholic    clinical diagnosis by endocrinologist   Fibromyalgia    GERD (gastroesophageal reflux disease)    controlled with PPI therapy   Hepatitis unknown type    High cholesterol    No medical therapy. Last lab March '12  LDL 91, T. Chol 170. Minimal elevation in '08   HNP (herniated nucleus pulposus), cervical    C6-7. Has had PT, no surgery   Iron deficiency anemia    per patient   Plantar fasciitis    Recurrent genital HSV (herpes simplex virus) infection    on daily suppression with acyclovir   SLE (systemic lupus erythematosus) (HCC)    sees rheum, on plaquenil   Urinary incontinence    Vitamin D deficiency    lab '09  Vit D = 19   Past  Surgical History:  Procedure Laterality Date   CHOLECYSTECTOMY     INCONTINENCE SURGERY  2009   SPINE SURGERY  05/2014   Cervical spine surgery C4-5, C5-6.  [place].   TOTAL ABDOMINAL HYSTERECTOMY W/ BILATERAL SALPINGOOPHORECTOMY  2009   with repair of cystocele and rectocele    Family History  Problem Relation Age of Onset   Heart disease Mother 40       AMI   Diabetes Mother        peripheral neuropathy   Hypertension Mother    Hyperlipidemia Mother    Mental illness Mother        depression and anxiety   Gout Mother    Deep vein thrombosis Mother    Diabetes Father    Heart disease Father    Hyperlipidemia Father    Mental illness Father        depression and anxiety   Cancer Sister 11       breast cancer   Mental illness Sister    Asthma Sister    Thyroid disease Sister    COPD Sister    Mental illness Sister    Mental illness Sister    Heart disease Brother        arrythmia, [place]   Hyperlipidemia Brother    Mental illness Brother    COPD Brother    Stroke  Brother    Hyperlipidemia Brother    Hypertension Brother    Mental illness Brother    Mental illness Brother    Mental illness Brother    GER disease Son     Social History   Tobacco Use   Smoking status: Never   Smokeless tobacco: Never  Substance Use Topics   Alcohol use: No   Marital Status: Single  ROS  Review of Systems  Constitutional: Positive for malaise/fatigue.  Cardiovascular:  Positive for dyspnea on exertion. Negative for chest pain and leg swelling.  Musculoskeletal:  Positive for arthritis and back pain.  Gastrointestinal:  Negative for melena.  Objective  Blood pressure 134/84, pulse 75, temperature (!) 97.5 F (36.4 C), temperature source Temporal, resp. rate 16, height 5' 1.5" (1.562 m), weight 221 lb (100.2 kg), SpO2 95 %. Body mass index is 41.08 kg/m.  Vitals with BMI 05/29/2021 05/29/2021 03/27/2021  Height - 5' 1.5" -  Weight - 221 lbs -  BMI - 75.10 -  Systolic 258 527  782  Diastolic 84 79 84  Pulse - 75 89    Physical Exam Constitutional:      Appearance: She is obese.  Neck:     Vascular: No carotid bruit or JVD.  Cardiovascular:     Rate and Rhythm: Normal rate and regular rhythm.     Pulses: Intact distal pulses.     Heart sounds: Normal heart sounds. No murmur heard.   No gallop.  Pulmonary:     Effort: Pulmonary effort is normal.     Breath sounds: Normal breath sounds.  Abdominal:     General: Bowel sounds are normal.     Palpations: Abdomen is soft.  Musculoskeletal:        General: No swelling.     Laboratory examination:   Recent Labs    08/20/20 1153 01/21/21 1121  NA 137 135  K 4.1 3.6  CL 103 100  CO2 26 26  GLUCOSE 100* 134*  BUN 7 8  CREATININE 0.90 0.92  CALCIUM 8.9 8.9  GFRNONAA 69  --   GFRAA 81  --    CrCl cannot be calculated (Patient's most recent lab result is older than the maximum 21 days allowed.).  CMP Latest Ref Rng & Units 01/21/2021 08/20/2020 05/20/2020  Glucose 65 - 99 mg/dL 134(H) 100(H) 89  BUN 7 - 25 mg/dL 8 7 7   Creatinine 0.50 - 1.05 mg/dL 0.92 0.90 0.98  Sodium 135 - 146 mmol/L 135 137 134(L)  Potassium 3.5 - 5.3 mmol/L 3.6 4.1 4.4  Chloride 98 - 110 mmol/L 100 103 98  CO2 20 - 32 mmol/L 26 26 28   Calcium 8.6 - 10.4 mg/dL 8.9 8.9 9.2  Total Protein 6.1 - 8.1 g/dL 6.4 6.6 7.1  Total Bilirubin 0.2 - 1.2 mg/dL 0.5 0.5 0.5  Alkaline Phos 44 - 121 IU/L - - -  AST 10 - 35 U/L 33 40(H) 28  ALT 6 - 29 U/L 44(H) 49(H) 33(H)   CBC Latest Ref Rng & Units 01/21/2021 08/20/2020 05/20/2020  WBC 3.8 - 10.8 Thousand/uL 4.5 6.6 5.0  Hemoglobin 11.7 - 15.5 g/dL 13.9 12.0 12.7  Hematocrit 35.0 - 45.0 % 41.5 36.5 38.9  Platelets 140 - 400 Thousand/uL 222 264 288   External labs:   Labs 05/06/2021:    Hgb A1c 5.5   4.8-5.6  CBC with Diff   2021-05-06   WBC 6.5   4.0-11.0 RBC 4.40   4.20-5.40 HGB 14.0  12.0-16.0 HCT 40.9   37.0-47.0 MCV 93.0   81.0-99.0 MCH 31.9   27.0-33.0 MCHC 34.3    32.0-36.0 RDW 13.3   11.5-15.5 PLT 258   150-400  Glucose 102   70-99 BUN 7   6-26 Creatinine 0.91   0.60-1.30 eGFR2021 72   >60 Sodium 136   136-145 Potassium 4.1   3.5-5.5 ALP 86   38-126 AST 36   0-39 ALT 53   0-52  Cholesterol 221   <200 CHOL/HDL 3.8   2.0-4.0 HDLD 57   30-85 Triglyceride 126   0-199 NHDL 163   0-129 LDL Chol Calc (NIH) 141   0-99  Medications and allergies   Allergies  Allergen Reactions   Robaxin [Methocarbamol] Rash   Duloxetine Other (See Comments)    Urinary retention   Penicillins     Has patient had a PCN reaction causing immediate rash, facial/tongue/throat swelling, SOB or lightheadedness with hypotension: Yes Has patient had a PCN reaction causing severe rash involving mucus membranes or skin necrosis: No Has patient had a PCN reaction that required hospitalization: No Has patient had a PCN reaction occurring within the last 10 years: No If all of the above answers are "NO", then may proceed with Cephalosporin use.      Medication prior to this encounter:   Outpatient Medications Prior to Visit  Medication Sig Dispense Refill   acyclovir (ZOVIRAX) 400 MG tablet Take 1 tablet (400 mg total) by mouth daily. 90 tablet 1   aspirin EC 81 MG tablet Take 81 mg by mouth daily.     buPROPion (WELLBUTRIN XL) 300 MG 24 hr tablet Take 300 mg by mouth daily.     diazepam (VALIUM) 5 MG tablet Take 0.5-1 tablets (2.5-5 mg total) by mouth daily as needed for anxiety. 30 tablet 0   escitalopram (LEXAPRO) 10 MG tablet Take 1 tablet (10 mg total) by mouth daily. 90 tablet 1   flavoxATE (URISPAS) 100 MG tablet Take 1 tablet (100 mg total) by mouth 3 (three) times daily as needed for bladder spasms. 30 tablet 3   fluticasone (FLONASE) 50 MCG/ACT nasal spray Place 2 sprays into both nostrils as needed. 16 g 5   gabapentin (NEURONTIN) 400 MG capsule Take 1 capsule (400 mg total) by mouth 4 (four) times daily. Can take with Lyrica- if she has any side effects,  will of course, decrease dose back down, but pain is her biggest limiter. 360 capsule 3   hydroxychloroquine (PLAQUENIL) 200 MG tablet TAKE ONE TABLET BY MOUTH TWICE A DAY MONDAY THROUGH FRIDAY 120 tablet 0   hydrOXYzine (ATARAX/VISTARIL) 25 MG tablet Take 1-2 tablets (25-50 mg total) by mouth at bedtime as needed (as needed for bladder inflammation). 30 tablet 5   lidocaine (XYLOCAINE) 5 % ointment Apply 1 application topically 4 (four) times daily as needed (feet or elbow/shoulder pain 4x/day as needed). 50 g 5   lurasidone (LATUDA) 40 MG TABS tablet Take 80 mg by mouth daily with breakfast.     nystatin cream (MYCOSTATIN) as needed.  0   omeprazole (PRILOSEC) 20 MG capsule Take 1 capsule (20 mg total) by mouth daily. 90 capsule 3   pregabalin (LYRICA) 100 MG capsule Take 1 capsule (100 mg total) by mouth 2 (two) times daily. For nerve pain 180 capsule 1   senna-docusate (SENOKOT-S) 8.6-50 MG tablet Take 2 tablets by mouth 2 (two) times daily. 120 tablet 2   tiZANidine (ZANAFLEX) 4 MG tablet Take 1 tablet (4 mg  total) by mouth 2 (two) times daily as needed for muscle spasms. 60 tablet 5   traMADol (ULTRAM) 50 MG tablet Take 1 tablet (50 mg total) by mouth 3 (three) times daily as needed. 90 tablet 5   losartan (COZAAR) 50 MG tablet Take 50 mg by mouth daily.     risperiDONE (RISPERDAL) 1 MG tablet Take 1 mg by mouth at bedtime.     No facility-administered medications prior to visit.     Medication list after today's encounter   Current Outpatient Medications  Medication Instructions   acyclovir (ZOVIRAX) 400 mg, Oral, Daily   amLODipine (NORVASC) 10 mg, Oral, Daily   aspirin EC 81 mg, Oral, Daily   atorvastatin (LIPITOR) 10 mg, Oral, Daily   buPROPion (WELLBUTRIN XL) 300 mg, Oral, Daily   diazepam (VALIUM) 2.5-5 mg, Oral, Daily PRN   escitalopram (LEXAPRO) 10 mg, Oral, Daily   flavoxATE (URISPAS) 100 mg, Oral, 3 times daily PRN   fluticasone (FLONASE) 50 MCG/ACT nasal spray 2 sprays,  Each Nare, As needed   gabapentin (NEURONTIN) 400 mg, Oral, 4 times daily, Can take with Lyrica- if she has any side effects, will of course, decrease dose back down, but pain is her biggest limiter.   hydroxychloroquine (PLAQUENIL) 200 MG tablet TAKE ONE TABLET BY MOUTH TWICE A DAY MONDAY THROUGH FRIDAY   hydrOXYzine (ATARAX) 25-50 mg, Oral, At bedtime PRN   lidocaine (XYLOCAINE) 5 % ointment 1 application, Topical, 4 times daily PRN   lurasidone (LATUDA) 80 mg, Oral, Daily with breakfast   nystatin cream (MYCOSTATIN) As needed   omeprazole (PRILOSEC) 20 mg, Oral, Daily   pregabalin (LYRICA) 100 mg, Oral, 2 times daily, For nerve pain   senna-docusate (SENOKOT-S) 8.6-50 MG tablet 2 tablets, Oral, 2 times daily   tiZANidine (ZANAFLEX) 4 mg, Oral, 2 times daily PRN   traMADol (ULTRAM) 50 mg, Oral, 3 times daily PRN    Radiology:   No results found.  Cardiac Studies:   Echocardiogram 03/29/2015: - Left ventricle: The cavity size was normal. Wall thickness was    normal. Systolic function was normal. The estimated ejection    fraction was in the range of 60% to 65%. Wall motion was normal;    there were no regional wall motion abnormalities. Features are    consistent with a pseudonormal left ventricular filling pattern,    with concomitant abnormal relaxation and increased filling    pressure (grade 2 diastolic dysfunction).  EKG:   EKG 05/29/2021: Normal sinus rhythm with rate of 71 bpm, normal axis, incomplete right bundle branch block.  Nonspecific T abnormality.    Assessment     ICD-10-CM   1. Shortness of breath  R06.02 EKG 12-Lead    PCV ECHOCARDIOGRAM COMPLETE    PCV MYOCARDIAL PERFUSION WITH LEXISCAN    2. Abnormal EKG  R94.31 PCV MYOCARDIAL PERFUSION WITH LEXISCAN    3. Primary hypertension  I10 amLODipine (NORVASC) 10 MG tablet    PCV ECHOCARDIOGRAM COMPLETE    4. Pure hypercholesterolemia  E78.00 atorvastatin (LIPITOR) 10 MG tablet    Lipid Profile    5. Class  3 severe obesity due to excess calories with serious comorbidity and body mass index (BMI) of 40.0 to 44.9 in adult [facility])  E66.01    Z68.41        Medications Discontinued During This Encounter  Medication Reason   risperiDONE (RISPERDAL) 1 MG tablet    losartan (COZAAR) 50 MG tablet Change in therapy  Meds ordered this encounter  Medications   amLODipine (NORVASC) 10 MG tablet    Sig: Take 1 tablet (10 mg total) by mouth daily.    Dispense:  90 tablet    Refill:  3    Stop Losartan   atorvastatin (LIPITOR) 10 MG tablet    Sig: Take 1 tablet (10 mg total) by mouth daily.    Dispense:  30 tablet    Refill:  2   Orders Placed This Encounter  Procedures   Lipid Profile   PCV MYOCARDIAL PERFUSION WITH LEXISCAN    Standing Status:   Future    Standing Expiration Date:   07/30/2021   EKG 12-Lead   PCV ECHOCARDIOGRAM COMPLETE    Standing Status:   Future    Standing Expiration Date:   05/29/2022   Recommendations:   Terri Hurley is a 60 y.o. Caucasian female patient with primary hypertension, fibromyalgia, lupus anticoagulant positive and on chronic Plaquenil, GERD, esophageal dysmotility, restless leg syndrome, mixed hyperlipidemia, referred to me for evaluation of dyspnea on exertion and abnormal EKG.  Patient is extremely high risk in view of connective tissue disease, her dyspnea on exertion is probably related to deconditioning and morbid obesity however underlying coronary disease in view of abnormal EKG cannot be excluded.  She also has uncontrolled lipids and needs therapy for the same as well.  I will discontinue losartan, patient is having frequent bowel movements, we will switch her to amlodipine 10 mg daily.  Hopefully this will improve her blood pressure control and also improve bowel movements.  I will also start her on Lipitor 10 mg daily.  In view of fibromyalgia, vitamin D deficiency, will not use high-dose statin.  At least we can try to have LDL goal less than  100.  I will set her up for an echocardiogram to evaluate her dyspnea and also Lexiscan nuclear stress test.  Patient unable to exercise due to diffuse arthritis and dyspnea.  Weight loss discussed extensively with the patient.  She is willing to make changes.  Office visit in 6 weeks.    Adrian Prows, MD, [facility] 05/29/2021, 11:20 AM Office: [phone]

## 2021-05-29 NOTE — Patient Instructions (Signed)
Please get blood work done 1 week prior to her office visit so I will have it available to discuss your cholesterol.  Please do not eat fried foods, try to decrease your calorie intake.  Also do not use too much stressing, try to change to a vinegar based dressing.  Try to walk on a daily basis as much as he can.

## 2021-06-04 DIAGNOSIS — F4323 Adjustment disorder with mixed anxiety and depressed mood: Secondary | ICD-10-CM | POA: Diagnosis not present

## 2021-06-09 ENCOUNTER — Ambulatory Visit: Payer: PPO

## 2021-06-09 ENCOUNTER — Other Ambulatory Visit: Payer: Self-pay

## 2021-06-09 DIAGNOSIS — R9431 Abnormal electrocardiogram [ECG] [EKG]: Secondary | ICD-10-CM

## 2021-06-09 DIAGNOSIS — R0602 Shortness of breath: Secondary | ICD-10-CM

## 2021-06-18 ENCOUNTER — Ambulatory Visit: Payer: PPO | Admitting: Physical Medicine and Rehabilitation

## 2021-06-25 ENCOUNTER — Encounter: Payer: Self-pay | Admitting: Physical Medicine and Rehabilitation

## 2021-06-25 ENCOUNTER — Other Ambulatory Visit: Payer: Self-pay

## 2021-06-25 ENCOUNTER — Telehealth: Payer: Self-pay

## 2021-06-25 ENCOUNTER — Encounter
Payer: Medicare Other | Attending: Physical Medicine and Rehabilitation | Admitting: Physical Medicine and Rehabilitation

## 2021-06-25 VITALS — BP 115/77 | HR 79 | Temp 98.3°F | Ht 61.5 in | Wt 216.0 lb

## 2021-06-25 DIAGNOSIS — G894 Chronic pain syndrome: Secondary | ICD-10-CM | POA: Diagnosis not present

## 2021-06-25 DIAGNOSIS — M792 Neuralgia and neuritis, unspecified: Secondary | ICD-10-CM | POA: Diagnosis not present

## 2021-06-25 DIAGNOSIS — M5126 Other intervertebral disc displacement, lumbar region: Secondary | ICD-10-CM | POA: Diagnosis not present

## 2021-06-25 DIAGNOSIS — R2681 Unsteadiness on feet: Secondary | ICD-10-CM

## 2021-06-25 DIAGNOSIS — M7918 Myalgia, other site: Secondary | ICD-10-CM

## 2021-06-25 MED ORDER — TRAMADOL HCL 50 MG PO TABS: 50.0000 mg | ORAL_TABLET | Freq: Three times a day (TID) | ORAL | 5 refills | Status: DC | PRN

## 2021-06-25 MED ORDER — PREGABALIN 100 MG PO CAPS: 100.0000 mg | ORAL_CAPSULE | Freq: Two times a day (BID) | ORAL | 1 refills | Status: DC

## 2021-06-25 NOTE — Patient Instructions (Signed)
Pt is a 61 yr old with severe depression- doesn't think has bipolar d/o per pt, allodynia, Lupus, fibromyalgia, and myofascial pain syndrome, and piriformis syndrome as well as bladder spasms and L4/5 disc issues with need for epidural - s/p epidural February 2022- Here for f/u on chronic pain - last EPI transforaminal steroid injection S1 Left injection 03/27/21  Is due- can get another epidural steroid injection by Dr Ernestina Patches- if wants to call them to get one.   2. Con't Gabapentin and Lyrica- no change in dose  3. Refill Lyrica 100 mg BID 3 month supply with 1 refill.   4. Refill Tramadol 50 mg 3x/day as needed- #90- 5 refills- sent in to pharmacy- last refill was 05/18/21 per bottle.   5. F/U in 3 months-

## 2021-06-25 NOTE — Telephone Encounter (Signed)
PA approved til 06/25/2022

## 2021-06-25 NOTE — Telephone Encounter (Signed)
PA for Tramadol has been sent to plan
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
## 2021-06-26 ENCOUNTER — Ambulatory Visit: Payer: 59 | Admitting: Rheumatology

## 2021-06-26 DIAGNOSIS — R1310 Dysphagia, unspecified: Secondary | ICD-10-CM | POA: Diagnosis not present

## 2021-06-26 DIAGNOSIS — A048 Other specified bacterial intestinal infections: Secondary | ICD-10-CM | POA: Diagnosis not present

## 2021-06-26 DIAGNOSIS — D509 Iron deficiency anemia, unspecified: Secondary | ICD-10-CM | POA: Diagnosis not present

## 2021-06-26 DIAGNOSIS — K317 Polyp of stomach and duodenum: Secondary | ICD-10-CM | POA: Diagnosis not present
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
## 2021-06-30 ENCOUNTER — Other Ambulatory Visit: Payer: Self-pay

## 2021-06-30 ENCOUNTER — Ambulatory Visit: Payer: Medicare Other

## 2021-06-30 DIAGNOSIS — R0602 Shortness of breath: Secondary | ICD-10-CM

## 2021-06-30 DIAGNOSIS — I10 Essential (primary) hypertension: Secondary | ICD-10-CM

## 2021-07-02 DIAGNOSIS — F4323 Adjustment disorder with mixed anxiety and depressed mood: Secondary | ICD-10-CM | POA: Diagnosis not present

## 2021-07-03 DIAGNOSIS — E7800 Pure hypercholesterolemia, unspecified: Secondary | ICD-10-CM | POA: Diagnosis not present

## 2021-07-04 LAB — LIPID PANEL
Chol/HDL Ratio: 2.7 ratio (ref 0.0–4.4)
Cholesterol, Total: 179 mg/dL (ref 100–199)
HDL: 66 mg/dL (ref >39)
LDL Chol Calc (NIH): 94 mg/dL (ref 0–99)
Triglycerides: 104 mg/dL (ref 0–149)
VLDL Cholesterol Cal: 19 mg/dL (ref 5–40)

## 2021-07-08 ENCOUNTER — Encounter: Payer: Self-pay | Admitting: Rheumatology

## 2021-07-08 ENCOUNTER — Ambulatory Visit: Payer: Medicare Other | Admitting: Rheumatology

## 2021-07-08 ENCOUNTER — Other Ambulatory Visit: Payer: Self-pay

## 2021-07-08 VITALS — BP 146/87 | HR 77 | Ht 61.5 in | Wt 218.0 lb

## 2021-07-08 DIAGNOSIS — M797 Fibromyalgia: Secondary | ICD-10-CM

## 2021-07-08 DIAGNOSIS — M19041 Primary osteoarthritis, right hand: Secondary | ICD-10-CM

## 2021-07-08 DIAGNOSIS — M5030 Other cervical disc degeneration, unspecified cervical region: Secondary | ICD-10-CM

## 2021-07-08 DIAGNOSIS — F5101 Primary insomnia: Secondary | ICD-10-CM

## 2021-07-08 DIAGNOSIS — G252 Other specified forms of tremor: Secondary | ICD-10-CM

## 2021-07-08 DIAGNOSIS — M19042 Primary osteoarthritis, left hand: Secondary | ICD-10-CM

## 2021-07-08 DIAGNOSIS — R748 Abnormal levels of other serum enzymes: Secondary | ICD-10-CM

## 2021-07-08 DIAGNOSIS — M3219 Other organ or system involvement in systemic lupus erythematosus: Secondary | ICD-10-CM | POA: Diagnosis not present

## 2021-07-08 DIAGNOSIS — Z79899 Other long term (current) drug therapy: Secondary | ICD-10-CM

## 2021-07-08 DIAGNOSIS — M8589 Other specified disorders of bone density and structure, multiple sites: Secondary | ICD-10-CM

## 2021-07-08 DIAGNOSIS — R5383 Other fatigue: Secondary | ICD-10-CM

## 2021-07-08 DIAGNOSIS — E559 Vitamin D deficiency, unspecified: Secondary | ICD-10-CM

## 2021-07-08 DIAGNOSIS — M51369 Other intervertebral disc degeneration, lumbar region without mention of lumbar back pain or lower extremity pain: Secondary | ICD-10-CM

## 2021-07-08 DIAGNOSIS — M5136 Other intervertebral disc degeneration, lumbar region: Secondary | ICD-10-CM

## 2021-07-08 DIAGNOSIS — I10 Essential (primary) hypertension: Secondary | ICD-10-CM

## 2021-07-08 DIAGNOSIS — E669 Obesity, unspecified: Secondary | ICD-10-CM

## 2021-07-08 DIAGNOSIS — R760 Raised antibody titer: Secondary | ICD-10-CM

## 2021-07-08 DIAGNOSIS — Z8269 Family history of other diseases of the musculoskeletal system and connective tissue: Secondary | ICD-10-CM

## 2021-07-08 DIAGNOSIS — K219 Gastro-esophageal reflux disease without esophagitis: Secondary | ICD-10-CM

## 2021-07-08 DIAGNOSIS — K760 Fatty (change of) liver, not elsewhere classified: Secondary | ICD-10-CM

## 2021-07-08 DIAGNOSIS — E66812 Obesity, class 2: Secondary | ICD-10-CM

## 2021-07-08 DIAGNOSIS — R296 Repeated falls: Secondary | ICD-10-CM

## 2021-07-08 MED ORDER — HYDROXYCHLOROQUINE SULFATE 200 MG PO TABS: ORAL_TABLET | ORAL | 0 refills | Status: DC

## 2021-07-09 ENCOUNTER — Telehealth: Payer: Self-pay | Admitting: Physical Medicine and Rehabilitation

## 2021-07-09 NOTE — Telephone Encounter (Signed)
Patient called needing to schedule an appointment with Dr Ernestina Patches for an injection in her back. The number to contact patient is [phone]

## 2021-07-10 ENCOUNTER — Ambulatory Visit: Payer: Medicare Other | Admitting: Cardiology

## 2021-07-10 ENCOUNTER — Encounter: Payer: Self-pay | Admitting: Cardiology

## 2021-07-10 ENCOUNTER — Other Ambulatory Visit: Payer: Self-pay

## 2021-07-10 VITALS — BP 140/80 | HR 75 | Temp 98.7°F | Resp 17 | Ht 61.5 in | Wt 217.8 lb

## 2021-07-10 DIAGNOSIS — Z6841 Body Mass Index (BMI) 40.0 and over, adult: Secondary | ICD-10-CM

## 2021-07-10 DIAGNOSIS — R9431 Abnormal electrocardiogram [ECG] [EKG]: Secondary | ICD-10-CM | POA: Diagnosis not present

## 2021-07-10 DIAGNOSIS — I10 Essential (primary) hypertension: Secondary | ICD-10-CM

## 2021-07-10 DIAGNOSIS — R0602 Shortness of breath: Secondary | ICD-10-CM

## 2021-07-10 DIAGNOSIS — E7800 Pure hypercholesterolemia, unspecified: Secondary | ICD-10-CM

## 2021-07-10 MED ORDER — METAMUCIL 48.57 % PO POWD: 1.0000 | Freq: Three times a day (TID) | ORAL | 3 refills | Status: DC

## 2021-07-10 MED ORDER — ATORVASTATIN CALCIUM 10 MG PO TABS: 10.0000 mg | ORAL_TABLET | Freq: Every day | ORAL | 2 refills | Status: DC

## 2021-07-10 NOTE — Progress Notes (Signed)
Primary Physician/Referring:  [name], MD  Patient ID: Terri Hurley, female    DOB: 13-Feb-1961, 61 y.o.   MRN: [id number]  Chief Complaint  Patient presents with   Shortness of Breath    6 WEEKS   Hypertension   Hyperlipidemia   HPI:    Terri Hurley  is a 61 y.o. Caucasian female patient with primary hypertension, fibromyalgia, lupus anticoagulant positive and on chronic Plaquenil, GERD, esophageal dysmotility, restless leg syndrome, mixed hyperlipidemia, referred to me for evaluation of dyspnea on exertion and abnormal EKG.  Continued about 6 weeks ago and I placed her on amlodipine for frequent appointments, no change in the bowel movements.  Patient has chronic dyspnea on exertion.  Her activity is markedly limited, patient states that due to fibromyalgia and generalized aches and pains all over her body she does not do much.  No new symptomatology since last office visit, continues to have occasional episodes of chest tightness.  Activity is markedly limited and sedentary lifestyle.  Past Medical History:  Diagnosis Date   Arthritis    low back, hip, hands   Depression    Sees [name], NP @ [facility]  counseling center.Marland Kitchen Has h/o hospitalization   Fatty liver disease, nonalcoholic    clinical diagnosis by endocrinologist   Fibromyalgia    GERD (gastroesophageal reflux disease)    controlled with PPI therapy   Hepatitis unknown type    High cholesterol    No medical therapy. Last lab March '12  LDL 91, T. Chol 170. Minimal elevation in '08   HNP (herniated nucleus pulposus), cervical    C6-7. Has had PT, no surgery   Iron deficiency anemia    per patient   Plantar fasciitis    Recurrent genital HSV (herpes simplex virus) infection    on daily suppression with acyclovir   SLE (systemic lupus erythematosus) (HCC)    sees rheum, on plaquenil   Urinary incontinence    Vitamin D deficiency    lab '09  Vit D = 19   Past Surgical History:   Procedure Laterality Date   CHOLECYSTECTOMY     INCONTINENCE SURGERY  2009   SPINE SURGERY  05/2014   Cervical spine surgery C4-5, C5-6.  [place].   TOTAL ABDOMINAL HYSTERECTOMY W/ BILATERAL SALPINGOOPHORECTOMY  2009   with repair of cystocele and rectocele    Family History  Problem Relation Age of Onset   Heart disease Mother 11       AMI   Diabetes Mother        peripheral neuropathy   Hypertension Mother    Hyperlipidemia Mother    Mental illness Mother        depression and anxiety   Gout Mother    Deep vein thrombosis Mother    Diabetes Father    Heart disease Father    Hyperlipidemia Father    Mental illness Father        depression and anxiety   Cancer Sister 49       breast cancer   Mental illness Sister    Asthma Sister    Thyroid disease Sister    COPD Sister    Mental illness Sister    Mental illness Sister    Heart disease Brother        arrythmia, [place]   Hyperlipidemia Brother    Mental illness Brother    COPD Brother    Stroke Brother    Hyperlipidemia Brother    Hypertension  Brother    Mental illness Brother    Mental illness Brother    Mental illness Brother    GER disease Son     Social History   Tobacco Use   Smoking status: Never   Smokeless tobacco: Never  Substance Use Topics   Alcohol use: No   Marital Status: Single  ROS  Review of Systems  Constitutional: Positive for malaise/fatigue.  Cardiovascular:  Positive for dyspnea on exertion. Negative for chest pain and leg swelling.  Musculoskeletal:  Positive for arthritis and back pain.  Gastrointestinal:  Negative for melena.  Objective  Blood pressure 140/80, pulse 75, temperature 98.7 F (37.1 C), temperature source Temporal, resp. rate 17, height 5' 1.5" (1.562 m), weight 217 lb 12.8 oz (98.8 kg), SpO2 98 %. Body mass index is 40.49 kg/m.  Vitals with BMI 07/10/2021 07/10/2021 07/08/2021  Height - 5' 1.5" 5' 1.5"  Weight - 217 lbs 13 oz 218 lbs  BMI - 01.77 93.90  Systolic  300 923 300  Diastolic 80 762 87  Pulse 75 83 77    Physical Exam Constitutional:      Appearance: She is obese.  Neck:     Vascular: No carotid bruit or JVD.  Cardiovascular:     Rate and Rhythm: Normal rate and regular rhythm.     Pulses: Intact distal pulses.     Heart sounds: Normal heart sounds. No murmur heard.   No gallop.  Pulmonary:     Effort: Pulmonary effort is normal.     Breath sounds: Normal breath sounds.  Abdominal:     General: Bowel sounds are normal.     Palpations: Abdomen is soft.  Musculoskeletal:        General: No swelling.     Laboratory examination:   Recent Labs    08/20/20 1153 01/21/21 1121 07/08/21 1541  NA 137 135 136  K 4.1 3.6 3.8  CL 103 100 102  CO2 26 26 27   GLUCOSE 100* 134* 143*  BUN 7 8 10   CREATININE 0.90 0.92 0.96  CALCIUM 8.9 8.9 9.1  GFRNONAA 69  --   --   GFRAA 81  --   --    estimated creatinine clearance is 66.9 mL/min (by C-G formula based on SCr of 0.96 mg/dL).  CMP Latest Ref Rng & Units 07/08/2021 01/21/2021 08/20/2020  Glucose 65 - 99 mg/dL 143(H) 134(H) 100(H)  BUN 7 - 25 mg/dL 10 8 7   Creatinine 0.50 - 1.05 mg/dL 0.96 0.92 0.90  Sodium 135 - 146 mmol/L 136 135 137  Potassium 3.5 - 5.3 mmol/L 3.8 3.6 4.1  Chloride 98 - 110 mmol/L 102 100 103  CO2 20 - 32 mmol/L 27 26 26   Calcium 8.6 - 10.4 mg/dL 9.1 8.9 8.9  Total Protein 6.1 - 8.1 g/dL 6.7 6.4 6.6  Total Bilirubin 0.2 - 1.2 mg/dL 0.5 0.5 0.5  Alkaline Phos 44 - 121 IU/L - - -  AST 10 - 35 U/L 30 33 40(H)  ALT 6 - 29 U/L 41(H) 44(H) 49(H)   CBC Latest Ref Rng & Units 07/08/2021 01/21/2021 08/20/2020  WBC 3.8 - 10.8 Thousand/uL 5.1 4.5 6.6  Hemoglobin 11.7 - 15.5 g/dL 14.3 13.9 12.0  Hematocrit 35.0 - 45.0 % 42.2 41.5 36.5  Platelets 140 - 400 Thousand/uL 216 222 264   External labs:   Labs 05/06/2021:    Hgb A1c 5.5   4.8-5.6  CBC with Diff   2021-05-06   WBC 6.5  4.0-11.0 RBC 4.40   4.20-5.40 HGB 14.0   12.0-16.0 HCT 40.9   37.0-47.0 MCV 93.0    81.0-99.0 MCH 31.9   27.0-33.0 MCHC 34.3   32.0-36.0 RDW 13.3   11.5-15.5 PLT 258   150-400  Glucose 102   70-99 BUN 7   6-26 Creatinine 0.91   0.60-1.30 eGFR2021 72   >60 Sodium 136   136-145 Potassium 4.1   3.5-5.5 ALP 86   38-126 AST 36   0-39 ALT 53   0-52  Cholesterol 221   <200 CHOL/HDL 3.8   2.0-4.0 HDLD 57   30-85 Triglyceride 126   0-199 NHDL 163   0-129 LDL Chol Calc (NIH) 141   0-99  Medications and allergies   Allergies  Allergen Reactions   Methocarbamol Rash    Other reaction(s): cannot remember   Duloxetine Other (See Comments)    Urinary retention Other reaction(s): cannot remember   Penicillin V     Other reaction(s): rash/itching   Penicillins     Has patient had a PCN reaction causing immediate rash, facial/tongue/throat swelling, SOB or lightheadedness with hypotension: Yes Has patient had a PCN reaction causing severe rash involving mucus membranes or skin necrosis: No Has patient had a PCN reaction that required hospitalization: No Has patient had a PCN reaction occurring within the last 10 years: No If all of the above answers are "NO", then may proceed with Cephalosporin use.      Medication prior to this encounter:   Outpatient Medications Prior to Visit  Medication Sig Dispense Refill   acyclovir (ZOVIRAX) 400 MG tablet Take 1 tablet (400 mg total) by mouth daily. 90 tablet 1   amLODipine (NORVASC) 10 MG tablet Take 1 tablet (10 mg total) by mouth daily. 90 tablet 3   aspirin EC 81 MG tablet Take 81 mg by mouth daily.     buPROPion (WELLBUTRIN XL) 300 MG 24 hr tablet Take 300 mg by mouth daily.     diazepam (VALIUM) 5 MG tablet Take 0.5-1 tablets (2.5-5 mg total) by mouth daily as needed for anxiety. 30 tablet 0   escitalopram (LEXAPRO) 10 MG tablet Take 1 tablet (10 mg total) by mouth daily. 90 tablet 1   flavoxATE (URISPAS) 100 MG tablet Take 1 tablet (100 mg total) by mouth 3 (three) times daily as needed for bladder spasms. 30 tablet  3   fluticasone (CUTIVATE) 3.50 % cream 1 application     fluticasone (FLONASE) 50 MCG/ACT nasal spray Place 2 sprays into both nostrils as needed. 16 g 5   gabapentin (NEURONTIN) 400 MG capsule Take 1 capsule (400 mg total) by mouth 4 (four) times daily. Can take with Lyrica- if she has any side effects, will of course, decrease dose back down, but pain is her biggest limiter. 360 capsule 3   hydroxychloroquine (PLAQUENIL) 200 MG tablet TAKE ONE TABLET BY MOUTH TWICE A DAY MONDAY THROUGH FRIDAY 120 tablet 0   hydrOXYzine (ATARAX/VISTARIL) 25 MG tablet Take 1-2 tablets (25-50 mg total) by mouth at bedtime as needed (as needed for bladder inflammation). 30 tablet 5   lidocaine (XYLOCAINE) 5 % ointment Apply 1 application topically 4 (four) times daily as needed (feet or elbow/shoulder pain 4x/day as needed). 50 g 5   lurasidone (LATUDA) 40 MG TABS tablet Take 80 mg by mouth daily with breakfast.     nystatin cream (MYCOSTATIN) as needed.  0   omeprazole (PRILOSEC) 20 MG capsule Take 1 capsule (20 mg total) by  mouth daily. 90 capsule 3   pregabalin (LYRICA) 100 MG capsule Take 1 capsule (100 mg total) by mouth 2 (two) times daily. For nerve pain 180 capsule 1   senna-docusate (SENOKOT-S) 8.6-50 MG tablet Take 2 tablets by mouth 2 (two) times daily. 120 tablet 2   tiZANidine (ZANAFLEX) 4 MG tablet Take 1 tablet (4 mg total) by mouth 2 (two) times daily as needed for muscle spasms. 60 tablet 5   traMADol (ULTRAM) 50 MG tablet Take 1 tablet (50 mg total) by mouth 3 (three) times daily as needed. 90 tablet 5   atorvastatin (LIPITOR) 10 MG tablet Take 1 tablet (10 mg total) by mouth daily. (Patient not taking: Reported on 07/10/2021) 30 tablet 2   No facility-administered medications prior to visit.     Medication list after today's encounter   Current Outpatient Medications  Medication Instructions   acyclovir (ZOVIRAX) 400 mg, Oral, Daily   amLODipine (NORVASC) 10 mg, Oral, Daily   aspirin EC 81  mg, Oral, Daily   atorvastatin (LIPITOR) 10 mg, Oral, Daily   buPROPion (WELLBUTRIN XL) 300 mg, Oral, Daily   diazepam (VALIUM) 2.5-5 mg, Oral, Daily PRN   escitalopram (LEXAPRO) 10 mg, Oral, Daily   flavoxATE (URISPAS) 100 mg, Oral, 3 times daily PRN   fluticasone (CUTIVATE) 4.13 % cream 1 application   fluticasone (FLONASE) 50 MCG/ACT nasal spray 2 sprays, Each Nare, As needed   gabapentin (NEURONTIN) 400 mg, Oral, 4 times daily, Can take with Lyrica- if she has any side effects, will of course, decrease dose back down, but pain is her biggest limiter.   hydroxychloroquine (PLAQUENIL) 200 MG tablet TAKE ONE TABLET BY MOUTH TWICE A DAY MONDAY THROUGH FRIDAY   hydrOXYzine (ATARAX) 25-50 mg, Oral, At bedtime PRN   lidocaine (XYLOCAINE) 5 % ointment 1 application, Topical, 4 times daily PRN   lurasidone (LATUDA) 80 mg, Oral, Daily with breakfast   nystatin cream (MYCOSTATIN) As needed   omeprazole (PRILOSEC) 20 mg, Oral, Daily   pregabalin (LYRICA) 100 mg, Oral, 2 times daily, For nerve pain   Psyllium (METAMUCIL) 48.57 % POWD 1 Scoop, Oral, 3 times daily before meals   senna-docusate (SENOKOT-S) 8.6-50 MG tablet 2 tablets, Oral, 2 times daily   tiZANidine (ZANAFLEX) 4 mg, Oral, 2 times daily PRN   traMADol (ULTRAM) 50 mg, Oral, 3 times daily PRN    Radiology:   No results found.  Cardiac Studies:   [facility] MYOCARDIAL PERFUSION WITH LEXISCAN 06/09/2021  Narrative Lexiscan/modified Bruce Tetrofosmin stress test 06/09/2021: No previous exam available for comparison. Lexiscan/modified Bruce nuclear stress test performed using 1-day protocol. Normal myocardial perfusion. Stress LVEF 68%. Low risk study.   PCV ECHOCARDIOGRAM COMPLETE 06/30/2021  Narrative Echocardiogram 06/30/2020: Left ventricle cavity is normal in size. Moderate concentric remodeling of the left ventricle. Normal global wall motion. Doppler evidence of grade I (impaired) diastolic dysfunction, normal LAP. Normal LV  systolic function with visual EF 55-60%. Calculated EF 54%. Structurally normal tricuspid valve with trace regurgitation. No evidence of tricuspid valve stenosis. No evidence of pulmonary hypertension.     EKG:   EKG 05/29/2021: Normal sinus rhythm with rate of 71 bpm, normal axis, incomplete right bundle branch block.  Nonspecific T abnormality.    Assessment     ICD-10-CM   1. Shortness of breath  R06.02     2. Abnormal EKG  R94.31     3. Primary hypertension  I10     4. Pure hypercholesterolemia  E78.00 atorvastatin (LIPITOR)  10 MG tablet    5. Class 3 severe obesity due to excess calories with serious comorbidity and body mass index (BMI) of 40.0 to 44.9 in adult (HCC)  E66.01 Psyllium (METAMUCIL) 48.57 % POWD   Z68.41        Medications Discontinued During This Encounter  Medication Reason   atorvastatin (LIPITOR) 10 MG tablet Reorder     Meds ordered this encounter  Medications   Psyllium (METAMUCIL) 48.57 % POWD    Sig: Take 1 Scoop by mouth 3 (three) times daily before meals.    Dispense:  425 g    Refill:  3   atorvastatin (LIPITOR) 10 MG tablet    Sig: Take 1 tablet (10 mg total) by mouth daily.    Dispense:  30 tablet    Refill:  2   No orders of the defined types were placed in this encounter.  Recommendations:   Amiria Orrison is a 61 y.o. Caucasian female patient with primary hypertension, fibromyalgia, lupus anticoagulant positive and on chronic Plaquenil, GERD, esophageal dysmotility, restless leg syndrome, mixed hyperlipidemia, referred to me for evaluation of dyspnea on exertion and abnormal EKG.  Continued about 6 weeks ago and I placed her on amlodipine for frequent appointments, no change in the bowel movements.  Blood pressure is significantly improved.  She discontinued atorvastatin thinking it is causing constipation however advised her to retry this again and I would like to place her on Metamucil 1 teaspoon 15 minutes prior to her meal so that  she can also try to lose weight and also will help with blood pressure control.  Her dyspnea on exertion is related to hypertension and obesity and deconditioning.  Chest pain symptoms may be related to GERD symptoms in view of morbid obesity.  Continue primary prevention would be appropriate at this time, overall CV risk low.  If she does not tolerate atorvastatin, we could certainly try a different statin and try to have LDL goal <100.  I will see her back on a as needed basis.     Adrian Prows, MD, [facility] 07/10/2021, 11:01 AM Office: [phone]

## 2021-07-14 NOTE — Progress Notes (Signed)
Double-stranded DNA is low titer positive which is unchanged, anticardiolipin antibodies are all positive, lupus anticoagulant is negative, anticardiolipin antibodies are positive, glucose is  elevated, liver function is elevated and stable, CBC is normal, UA negative, complements are normal, sed rate is normal.  Labs do not indicate disease flare.  Plaquenil level is pending.  Patient should continue Plaquenil and aspirin.  We will adjust the dose of Plaquenil based on the Plaquenil level when available.

## 2021-07-16 DIAGNOSIS — F4323 Adjustment disorder with mixed anxiety and depressed mood: Secondary | ICD-10-CM | POA: Diagnosis not present

## 2021-07-18 ENCOUNTER — Other Ambulatory Visit: Payer: Self-pay | Admitting: *Deleted

## 2021-07-18 DIAGNOSIS — Z79899 Other long term (current) drug therapy: Secondary | ICD-10-CM

## 2021-07-18 DIAGNOSIS — M3219 Other organ or system involvement in systemic lupus erythematosus: Secondary | ICD-10-CM

## 2021-07-18 LAB — CBC WITH DIFFERENTIAL/PLATELET
Absolute Monocytes: 495 cells/uL (ref 200–950)
Basophils Absolute: 31 cells/uL (ref 0–200)
Basophils Relative: 0.6 %
Eosinophils Absolute: 31 cells/uL (ref 15–500)
Eosinophils Relative: 0.6 %
HCT: 42.2 % (ref 35.0–45.0)
Hemoglobin: 14.3 g/dL (ref 11.7–15.5)
Lymphs Abs: 1040 cells/uL (ref 850–3900)
MCH: 31.6 pg (ref 27.0–33.0)
MCHC: 33.9 g/dL (ref 32.0–36.0)
MCV: 93.2 fL (ref 80.0–100.0)
MPV: 11.6 fL (ref 7.5–12.5)
Monocytes Relative: 9.7 %
Neutro Abs: 3504 cells/uL (ref 1500–7800)
Neutrophils Relative %: 68.7 %
Platelets: 216 10*3/uL (ref 140–400)
RBC: 4.53 10*6/uL (ref 3.80–5.10)
RDW: 12.5 % (ref 11.0–15.0)
Total Lymphocyte: 20.4 %
WBC: 5.1 10*3/uL (ref 3.8–10.8)

## 2021-07-18 LAB — CARDIOLIPIN ANTIBODIES, IGG, IGM, IGA
Anticardiolipin IgA: 42.5 APL-U/mL — ABNORMAL HIGH
Anticardiolipin IgG: 21.4 GPL-U/mL — ABNORMAL HIGH
Anticardiolipin IgM: 45.4 MPL-U/mL — ABNORMAL HIGH

## 2021-07-18 LAB — COMPLETE METABOLIC PANEL WITH GFR
AG Ratio: 1.7 (calc) (ref 1.0–2.5)
ALT: 41 U/L — ABNORMAL HIGH (ref 6–29)
AST: 30 U/L (ref 10–35)
Albumin: 4.2 g/dL (ref 3.6–5.1)
Alkaline phosphatase (APISO): 67 U/L (ref 37–153)
BUN: 10 mg/dL (ref 7–25)
CO2: 27 mmol/L (ref 20–32)
Calcium: 9.1 mg/dL (ref 8.6–10.4)
Chloride: 102 mmol/L (ref 98–110)
Creat: 0.96 mg/dL (ref 0.50–1.05)
Globulin: 2.5 g/dL (calc) (ref 1.9–3.7)
Glucose, Bld: 143 mg/dL — ABNORMAL HIGH (ref 65–99)
Potassium: 3.8 mmol/L (ref 3.5–5.3)
Sodium: 136 mmol/L (ref 135–146)
Total Bilirubin: 0.5 mg/dL (ref 0.2–1.2)
Total Protein: 6.7 g/dL (ref 6.1–8.1)
eGFR: 67 mL/min/{1.73_m2} (ref >=60)

## 2021-07-18 LAB — LUPUS ANTICOAGULANT EVAL W/ REFLEX
PTT-LA Screen: 35 s (ref <=40)
dRVVT: 46 s — ABNORMAL HIGH (ref <=45)

## 2021-07-18 LAB — RFX DRVVT 1:1 MIX

## 2021-07-18 LAB — URINALYSIS, ROUTINE W REFLEX MICROSCOPIC
Bilirubin Urine: NEGATIVE
Glucose, UA: NEGATIVE
Hgb urine dipstick: NEGATIVE
Ketones, ur: NEGATIVE
Leukocytes,Ua: NEGATIVE
Nitrite: NEGATIVE
Protein, ur: NEGATIVE
Specific Gravity, Urine: 1.014 (ref 1.001–1.035)
pH: 6 (ref 5.0–8.0)

## 2021-07-18 LAB — C3 AND C4
C3 Complement: 115 mg/dL (ref 83–193)
C4 Complement: 15 mg/dL (ref 15–57)

## 2021-07-18 LAB — BETA-2 GLYCOPROTEIN ANTIBODIES
Beta-2 Glyco 1 IgA: 39.3 U/mL — ABNORMAL HIGH
Beta-2 Glyco 1 IgM: 50.2 U/mL — ABNORMAL HIGH
Beta-2 Glyco I IgG: 53.2 U/mL — ABNORMAL HIGH

## 2021-07-18 LAB — HYDROXYCHLOROQUINE,BLOOD: HYDROXYCHLOROQUINE, (B): 290 ng/mL

## 2021-07-18 LAB — SEDIMENTATION RATE: Sed Rate: 11 mm/h (ref 0–30)

## 2021-07-18 LAB — ANTI-DNA ANTIBODY, DOUBLE-STRANDED: ds DNA Ab: 5 IU/mL — ABNORMAL HIGH

## 2021-07-18 LAB — RFLX DRVVT CONFRIM: DRVVT CONFIRM: POSITIVE — AB

## 2021-07-18 MED ORDER — HYDROXYCHLOROQUINE SULFATE 200 MG PO TABS: 200.0000 mg | ORAL_TABLET | Freq: Two times a day (BID) | ORAL | 0 refills | Status: DC

## 2021-07-18 NOTE — Progress Notes (Signed)
Hydroxychloroquine level is subtherapeutic.  It is lower than before.  I question if patient is taking hydroxychloroquine on a regular basis.  Please ask if she is not taking it on a regular basis she can take twice a day Monday to Friday.  If she is taking hydroxychloroquine twice daily Monday to Friday on a regular basis then we will have to increase the dose of hydroxychloroquine to 1 tablet p.o. twice daily and recheck levels in 3 months.

## 2021-07-18 NOTE — Telephone Encounter (Signed)
-----   Message from Bo Merino, MD sent at 07/18/2021  8:13 AM EST ----- Hydroxychloroquine level is subtherapeutic.  It is lower than before.  I question if patient is taking hydroxychloroquine on a regular basis.  Please ask if she is not taking it on a regular basis she can take twice a day Monday to Friday.  If she is  taking hydroxychloroquine twice daily Monday to Friday on a regular basis then we will have to increase the dose of hydroxychloroquine to 1 tablet p.o. twice daily and recheck levels in 3 months.

## 2021-07-29 ENCOUNTER — Ambulatory Visit (INDEPENDENT_AMBULATORY_CARE_PROVIDER_SITE_OTHER): Payer: Medicare Other | Admitting: Physical Medicine and Rehabilitation

## 2021-07-29 ENCOUNTER — Ambulatory Visit: Payer: Self-pay

## 2021-07-29 ENCOUNTER — Other Ambulatory Visit: Payer: Self-pay

## 2021-07-29 ENCOUNTER — Encounter: Payer: Self-pay | Admitting: Physical Medicine and Rehabilitation

## 2021-07-29 VITALS — BP 146/82 | HR 84

## 2021-07-29 DIAGNOSIS — M5416 Radiculopathy, lumbar region: Secondary | ICD-10-CM | POA: Diagnosis not present

## 2021-07-29 MED ORDER — METHYLPREDNISOLONE ACETATE 80 MG/ML IJ SUSP
80.0000 mg | Freq: Once | INTRAMUSCULAR | Status: AC
  Administered 2021-07-29: 80 mg

## 2021-07-29 NOTE — Procedures (Signed)
S1 Lumbosacral Transforaminal Epidural Steroid Injection - Sub-Pedicular Approach with Fluoroscopic Guidance   Patient: Terri Hurley      Date of Birth: 07/10/1960 MRN: [id number] PCP: [name], MD      Visit Date: 07/29/2021   Universal Protocol:    Date/Time: 02/07/231:38 PM  Consent Given By: the patient  Position:  PRONE  Additional Comments: Vital signs were monitored before and after the procedure. Patient was prepped and draped in the usual sterile fashion. The correct patient, procedure, and site was verified.   Injection Procedure Details:  Procedure Site One Meds Administered:  Meds ordered this encounter  Medications   methylPREDNISolone acetate (DEPO-MEDROL) injection 80 mg    Laterality: Bilateral  Location/Site:  S1 Foramen   Needle size: 22 ga.  Needle type: Spinal  Needle Placement: Transforaminal  Findings:   -Comments: Excellent flow of contrast along the nerve, nerve root and into the epidural space.  Epidurogram: Contrast epidurogram showed no nerve root cut off or restricted flow pattern.  Procedure Details: After squaring off the sacral end-plate to get a true AP view, the C-arm was positioned so that the best possible view of the S1 foramen was visualized. The soft tissues overlying this structure were infiltrated with 2-3 ml. of 1% Lidocaine without Epinephrine.    The spinal needle was inserted toward the target using a "trajectory" view along the fluoroscope beam.  Under AP and lateral visualization, the needle was advanced so it did not puncture dura. Biplanar projections were used to confirm position. Aspiration was confirmed to be negative for CSF and/or blood. A 1-2 ml. volume of Isovue-250 was injected and flow of contrast was noted at each level. Radiographs were obtained for documentation purposes.   After attaining the desired flow of contrast documented above, a 0.5 to 1.0 ml test dose of 0.25% Marcaine was injected into  each respective transforaminal space.  The patient was observed for 90 seconds post injection.  After no sensory deficits were reported, and normal lower extremity motor function was noted,   the above injectate was administered so that equal amounts of the injectate were placed at each foramen (level) into the transforaminal epidural space.   Additional Comments:  The patient tolerated the procedure well Dressing: Band-Aid with 2 x 2 sterile gauze    Post-procedure details: Patient was observed during the procedure. Post-procedure instructions were reviewed.  Patient left the clinic in stable condition.

## 2021-07-29 NOTE — Progress Notes (Signed)
Pt state lower back pain. Pt state walking, standing and sitting makes the pain worse. Pt state she takes pain meds to help ease her pain.  Numeric Pain Rating Scale and Functional Assessment Average Pain 5   In the last MONTH (on 0-10 scale) has pain interfered with the following?  1. General activity like being  able to carry out your everyday physical activities such as walking, climbing stairs, carrying groceries, or moving a chair?  Rating(10)   +Driver, -BT, -Dye Allergies.

## 2021-07-29 NOTE — Progress Notes (Signed)
Terri Hurley - 61 y.o. female MRN [id number]  Date of birth: 01-Oct-1960  Office Visit Note: Visit Date: 07/29/2021 PCP: [name], MD Referred by: [name], MD  Subjective: Chief Complaint  Patient presents with   Lower Back - Pain   HPI:  Terri Hurley is a 61 y.o. female who comes in todayFor planned repeat bilateral S1 transforaminal epidural steroid injection.  Patient had prior injection in October and that did offer more than 60% relief diagnostically and therapeutically for about 3 months or more.  She reports no new injury just worsening symptoms over the last several months despite home exercise program medication management etc.  She sees Dr.Lovvorn at [facility] physical medicine rehabilitation for comprehensive pain management and physiatric support.  Since have seen her last she has had updated MRI of the lumbar spine through Dr. Sherley Bounds.  We did have her see Dr. Sherley Bounds do the fact that she had continued back pain with known disc extrusion.  MRI of the lumbar spine is reviewed today and reviewed below the notes.  It does show some resolution of the disc herniation now really would not impacting the lateral recess but there is still sequestered fragment on the left.  She has more pain on the right incidentally.  ROS Otherwise per HPI.  Assessment & Plan: Visit Diagnoses:    ICD-10-CM   1. Lumbar radiculopathy  M54.16 XR C-ARM NO REPORT    Epidural Steroid injection    methylPREDNISolone acetate (DEPO-MEDROL) injection 80 mg      Plan: No additional findings.   Meds & Orders:  Meds ordered this encounter  Medications   methylPREDNISolone acetate (DEPO-MEDROL) injection 80 mg    Orders Placed This Encounter  Procedures   XR C-ARM NO REPORT   Epidural Steroid injection    Follow-up: Return if symptoms worsen or fail to improve.   Procedures: No procedures performed  S1 Lumbosacral Transforaminal Epidural Steroid Injection - Sub-Pedicular  Approach with Fluoroscopic Guidance   Patient: Terri Hurley      Date of Birth: [birth year] MRN: [id number] PCP: [name], MD      Visit Date: 07/29/2021   Universal Protocol:    Date/Time: 02/07/231:38 PM  Consent Given By: the patient  Position:  PRONE  Additional Comments: Vital signs were monitored before and after the procedure. Patient was prepped and draped in the usual sterile fashion. The correct patient, procedure, and site was verified.   Injection Procedure Details:  Procedure Site One Meds Administered:  Meds ordered this encounter  Medications   methylPREDNISolone acetate (DEPO-MEDROL) injection 80 mg    Laterality: Bilateral  Location/Site:  S1 Foramen   Needle size: 22 ga.  Needle type: Spinal  Needle Placement: Transforaminal  Findings:   -Comments: Excellent flow of contrast along the nerve, nerve root and into the epidural space.  Epidurogram: Contrast epidurogram showed no nerve root cut off or restricted flow pattern.  Procedure Details: After squaring off the sacral end-plate to get a true AP view, the C-arm was positioned so that the best possible view of the S1 foramen was visualized. The soft tissues overlying this structure were infiltrated with 2-3 ml. of 1% Lidocaine without Epinephrine.    The spinal needle was inserted toward the target using a "trajectory" view along the fluoroscope beam.  Under AP and lateral visualization, the needle was advanced so it did not puncture dura. Biplanar projections were used to confirm position. Aspiration was confirmed to be  negative for CSF and/or blood. A 1-2 ml. volume of Isovue-250 was injected and flow of contrast was noted at each level. Radiographs were obtained for documentation purposes.   After attaining the desired flow of contrast documented above, a 0.5 to 1.0 ml test dose of 0.25% Marcaine was injected into each respective transforaminal space.  The patient was observed for 90  seconds post injection.  After no sensory deficits were reported, and normal lower extremity motor function was noted,   the above injectate was administered so that equal amounts of the injectate were placed at each foramen (level) into the transforaminal epidural space.   Additional Comments:  The patient tolerated the procedure well Dressing: Band-Aid with 2 x 2 sterile gauze    Post-procedure details: Patient was observed during the procedure. Post-procedure instructions were reviewed.  Patient left the clinic in stable condition.    Clinical History: MRI LUMBAR SPINE WITHOUT CONTRAST  TECHNIQUE: Multiplanar, multisequence MR imaging of the lumbar spine was performed. No intravenous contrast was administered.  COMPARISON: Lumbar spine MRI 03/29/2019  FINDINGS: Segmentation: Standard.  Alignment: Mild lumbar dextroscoliosis. No listhesis.  Vertebrae: No fracture, suspicious marrow lesion, or significant marrow edema.  Conus medullaris and cauda equina: Conus extends to the L1 level. Conus and cauda equina appear normal.  Paraspinal and other soft tissues: Unremarkable.  Disc levels:  Mild disc desiccation and up to mild disc space narrowing throughout the lumbar spine.  L1-2: Mild disc bulging eccentric to the left, endplate spurring, and slight facet hypertrophy result in borderline to mild left neural foraminal stenosis without spinal stenosis, unchanged.  L2-3: Disc bulging eccentric to the right, endplate spurring, and mild facet hypertrophy result in mild right neural foraminal stenosis without spinal stenosis, unchanged.  L3-4: Mild disc bulging, endplate spurring, and mild facet hypertrophy result in borderline left neural foraminal stenosis without spinal stenosis, unchanged.  L4-5: Disc bulging, endplate spurring, and mild facet hypertrophy result in borderline to mild bilateral neural foraminal stenosis without spinal stenosis, unchanged.  L5-S1:  The central disc extrusion on the prior study has regressed. There is a shallow residual central disc protrusion with annular fissure in close proximity to the left greater than right S1 nerve roots without evidence of neural compression or significant stenosis. A 6 mm focus of soft tissue in the left lateral recess inferior to the disc space presumably reflects a residual disc fragment, overall smaller than on the prior study though potentially irritating the left S1 nerve root. Mild facet hypertrophy. Patent neural foramina.  IMPRESSION: 1. Regression of L5-S1 disc extrusion. Persistent small residual sequestered disc fragment in the left lateral recess potentially affecting the left S1 nerve root. 2. Unchanged disc and facet degeneration elsewhere with up to mild neural foraminal stenosis. No spinal stenosis.   Electronically Signed By: Logan Bores M.D. On: 05/15/2021 19:20     Objective:  VS:  HT:     WT:    BMI:      BP:(!) 146/82   HR:84bpm   TEMP: ( )   RESP:  Physical Exam Vitals and nursing note reviewed.  Constitutional:      General: She is not in acute distress.    Appearance: Normal appearance. She is not ill-appearing.  HENT:     Head: Normocephalic and atraumatic.     Right Ear: External ear normal.     Left Ear: External ear normal.  Eyes:     Extraocular Movements: Extraocular movements intact.  Cardiovascular:  Rate and Rhythm: Normal rate.     Pulses: Normal pulses.  Pulmonary:     Effort: Pulmonary effort is normal. No respiratory distress.  Abdominal:     General: There is no distension.     Palpations: Abdomen is soft.  Musculoskeletal:        General: Tenderness present.     Cervical back: Neck supple.     Right lower leg: No edema.     Left lower leg: No edema.     Comments: Patient has good distal strength with no pain over the greater trochanters.  No clonus or focal weakness.  Skin:    Findings: No erythema, lesion or rash.   Neurological:     General: No focal deficit present.     Mental Status: She is alert and oriented to person, place, and time.     Sensory: No sensory deficit.     Motor: No weakness or abnormal muscle tone.     Coordination: Coordination normal.  Psychiatric:        Mood and Affect: Mood normal.        Behavior: Behavior normal.     Imaging: XR C-ARM NO REPORT  Result Date: 07/29/2021 Please see Notes tab for imaging impression.

## 2021-07-29 NOTE — Patient Instructions (Signed)

## 2021-07-30 DIAGNOSIS — F4323 Adjustment disorder with mixed anxiety and depressed mood: Secondary | ICD-10-CM | POA: Diagnosis not present

## 2021-08-04 ENCOUNTER — Other Ambulatory Visit: Payer: Self-pay

## 2021-08-04 ENCOUNTER — Encounter (HOSPITAL_COMMUNITY): Payer: Self-pay | Admitting: Gastroenterology

## 2021-08-07 ENCOUNTER — Other Ambulatory Visit: Payer: Self-pay | Admitting: Gastroenterology

## 2021-08-08 DIAGNOSIS — H02831 Dermatochalasis of right upper eyelid: Secondary | ICD-10-CM | POA: Diagnosis not present

## 2021-08-08 DIAGNOSIS — H40033 Anatomical narrow angle, bilateral: Secondary | ICD-10-CM | POA: Diagnosis not present

## 2021-08-08 DIAGNOSIS — Z79899 Other long term (current) drug therapy: Secondary | ICD-10-CM | POA: Diagnosis not present

## 2021-08-08 DIAGNOSIS — M797 Fibromyalgia: Secondary | ICD-10-CM | POA: Diagnosis not present

## 2021-08-12 ENCOUNTER — Ambulatory Visit (HOSPITAL_COMMUNITY)
Admission: RE | Admit: 2021-08-12 | Discharge: 2021-08-12 | Disposition: A | Discharge status: Home / Self Care | Payer: Medicare Other | Attending: Gastroenterology | Admitting: Gastroenterology

## 2021-08-12 ENCOUNTER — Ambulatory Visit (HOSPITAL_BASED_OUTPATIENT_CLINIC_OR_DEPARTMENT_OTHER): Payer: Medicare Other | Admitting: Certified Registered Nurse Anesthetist

## 2021-08-12 ENCOUNTER — Encounter (HOSPITAL_COMMUNITY): Payer: Self-pay | Admitting: Gastroenterology

## 2021-08-12 ENCOUNTER — Encounter (HOSPITAL_COMMUNITY)
Admission: RE | Disposition: A | Discharge status: Home / Self Care | Payer: Self-pay | Source: Home / Self Care | Attending: Gastroenterology

## 2021-08-12 ENCOUNTER — Ambulatory Visit (HOSPITAL_COMMUNITY): Payer: Medicare Other | Admitting: Certified Registered Nurse Anesthetist

## 2021-08-12 DIAGNOSIS — K6389 Other specified diseases of intestine: Secondary | ICD-10-CM | POA: Insufficient documentation

## 2021-08-12 DIAGNOSIS — I10 Essential (primary) hypertension: Secondary | ICD-10-CM | POA: Diagnosis not present

## 2021-08-12 DIAGNOSIS — K640 First degree hemorrhoids: Secondary | ICD-10-CM | POA: Diagnosis not present

## 2021-08-12 DIAGNOSIS — Z1211 Encounter for screening for malignant neoplasm of colon: Secondary | ICD-10-CM | POA: Insufficient documentation

## 2021-08-12 DIAGNOSIS — D122 Benign neoplasm of ascending colon: Secondary | ICD-10-CM | POA: Insufficient documentation

## 2021-08-12 DIAGNOSIS — K635 Polyp of colon: Secondary | ICD-10-CM

## 2021-08-12 DIAGNOSIS — D124 Benign neoplasm of descending colon: Secondary | ICD-10-CM | POA: Diagnosis not present

## 2021-08-12 DIAGNOSIS — Z86010 Personal history of colonic polyps: Secondary | ICD-10-CM | POA: Insufficient documentation

## 2021-08-12 DIAGNOSIS — D175 Benign lipomatous neoplasm of intra-abdominal organs: Secondary | ICD-10-CM | POA: Insufficient documentation

## 2021-08-12 DIAGNOSIS — Z860101 Personal history of adenomatous and serrated colon polyps: Secondary | ICD-10-CM

## 2021-08-12 HISTORY — PX: COLONOSCOPY WITH PROPOFOL: SHX5780

## 2021-08-12 HISTORY — DX: Essential (primary) hypertension: I10

## 2021-08-12 HISTORY — PX: BIOPSY: SHX5522

## 2021-08-12 HISTORY — PX: POLYPECTOMY: SHX5525

## 2021-08-12 SURGERY — COLONOSCOPY WITH PROPOFOL
Anesthesia: Monitor Anesthesia Care

## 2021-08-12 MED ORDER — PROPOFOL 500 MG/50ML IV EMUL
INTRAVENOUS | Status: DC | PRN
  Administered 2021-08-12: 75 ug/kg/min via INTRAVENOUS

## 2021-08-12 MED ORDER — PROPOFOL 10 MG/ML IV BOLUS
INTRAVENOUS | Status: DC | PRN
  Administered 2021-08-12 (×2): 20 mg via INTRAVENOUS
  Administered 2021-08-12: 50 mg via INTRAVENOUS

## 2021-08-12 MED ORDER — SODIUM CHLORIDE 0.9 % IV SOLN: INTRAVENOUS | Status: DC

## 2021-08-12 MED ORDER — ONDANSETRON HCL 4 MG/2ML IJ SOLN
INTRAMUSCULAR | Status: DC | PRN
  Administered 2021-08-12: 4 mg via INTRAVENOUS

## 2021-08-12 MED ORDER — LACTATED RINGERS IV SOLN
INTRAVENOUS | Status: DC
  Administered 2021-08-12: 1000 mL via INTRAVENOUS

## 2021-08-12 SURGICAL SUPPLY — 22 items

## 2021-08-12 NOTE — Interval H&P Note (Signed)
History and Physical Interval Note:  08/12/2021 9:56 AM  Terri Hurley  has presented today for surgery, with the diagnosis of Colon polyp.  The various methods of treatment have been discussed with the patient and family. After consideration of risks, benefits and other options for treatment, the patient has consented to  Procedure(s): COLONOSCOPY WITH PROPOFOL (N/A) HOT HEMOSTASIS (ARGON PLASMA COAGULATION/BICAP) (N/A) as a surgical intervention.  The patient's history has been reviewed, patient examined, no change in status, stable for surgery.  I have reviewed the patient's chart and labs.  Questions were answered to the patient's satisfaction.     Lear Ng

## 2021-08-12 NOTE — H&P (Signed)
Date of Initial H&P: 08/07/21  History reviewed, patient examined, no change in status, stable for surgery.

## 2021-08-12 NOTE — Anesthesia Postprocedure Evaluation (Signed)
Anesthesia Post Note  Patient: Terri Hurley  Procedure(s) Performed: COLONOSCOPY WITH PROPOFOL HOT HEMOSTASIS (ARGON PLASMA COAGULATION/BICAP) BIOPSY POLYPECTOMY     Patient location during evaluation: PACU Anesthesia Type: MAC Level of consciousness: awake and alert Pain management: pain level controlled Vital Signs Assessment: post-procedure vital signs reviewed and stable Respiratory status: spontaneous breathing and respiratory function stable Cardiovascular status: stable Postop Assessment: no apparent nausea or vomiting Anesthetic complications: no   No notable events documented.  Last Vitals:  Vitals:   08/12/21 1140 08/12/21 1145  BP: 113/78   Pulse: (!) 55 (!) 58  Resp: 11 13  Temp:    SpO2: 92% 94%    Last Pain:  Vitals:   08/12/21 1140  TempSrc:   PainSc: 0-No pain                 Jacqueline Spofford DANIEL

## 2021-08-12 NOTE — Discharge Instructions (Signed)

## 2021-08-12 NOTE — Anesthesia Procedure Notes (Signed)
Procedure Name: MAC Date/Time: 08/12/2021 10:06 AM Performed by: Deliah Boston, CRNA Pre-anesthesia Checklist: Patient identified, Emergency Drugs available, Suction available and Patient being monitored Patient Re-evaluated:Patient Re-evaluated prior to induction Oxygen Delivery Method: Simple face mask Preoxygenation: Pre-oxygenation with 100% oxygen Placement Confirmation: positive ETCO2 and breath sounds checked- equal and bilateral

## 2021-08-12 NOTE — Transfer of Care (Signed)
Immediate Anesthesia Transfer of Care Note  Patient: Ahja Martello Wisinski  Procedure(s) Performed: Procedure(s): COLONOSCOPY WITH PROPOFOL (N/A) HOT HEMOSTASIS (ARGON PLASMA COAGULATION/BICAP) (N/A) BIOPSY POLYPECTOMY  Patient Location: PACU  Anesthesia Type:MAC  Level of Consciousness: Patient easily awoken, sedated, comfortable, cooperative, following commands, responds to stimulation.   Airway & Oxygen Therapy: Patient spontaneously breathing, ventilating well, oxygen via simple oxygen mask.  Post-op Assessment: Report given to PACU RN, vital signs reviewed and stable, moving all extremities.   Post vital signs: Reviewed and stable.  Complications: No apparent anesthesia complications  Last Vitals:  Vitals Value Taken Time  BP    Temp    Pulse 54 08/12/21 1051  Resp 12 08/12/21 1051  SpO2 93 % 08/12/21 1051  Vitals shown include unvalidated device data.  Last Pain:  Vitals:   08/12/21 0954  TempSrc: Tympanic  PainSc: 0-No pain         Complications: No notable events documented.

## 2021-08-12 NOTE — Op Note (Signed)
[facility] Patient Name: Terri Hurley Procedure Date: 08/12/2021 MRN: [id number] Attending MD: Lear Ng , MD Date of Birth: [birth year] CSN: [id number] Age: 61 Admit Type: Inpatient Procedure:                Colonoscopy Indications:              High risk colon cancer surveillance: Personal                            history of colonic polyps, Last colonoscopy within                            the past 6 months Providers:                Lear Ng, MD, Ervin Knack, Benetta Spar, Technician Referring MD:             [name] Medicines:                Propofol per Anesthesia, Monitored Anesthesia Care Complications:            No immediate complications. Estimated Blood Loss:     Estimated blood loss was minimal. Procedure:                Pre-Anesthesia Assessment:                           - Prior to the procedure, a History and Physical                            was performed, and patient medications and                            allergies were reviewed. The patient's tolerance of                            previous anesthesia was also reviewed. The risks                            and benefits of the procedure and the sedation                            options and risks were discussed with the patient.                            All questions were answered, and informed consent                            was obtained. Prior Anticoagulants: The patient has                            taken no previous anticoagulant or antiplatelet  agents. ASA Grade Assessment: III - A patient with                            severe systemic disease. After reviewing the risks                            and benefits, the patient was deemed in                            satisfactory condition to undergo the procedure.                           After obtaining informed consent, the colonoscope                             was passed under direct vision. Throughout the                            procedure, the patient's blood pressure, pulse, and                            oxygen saturations were monitored continuously. The                            PCF-HQ190L ([id number]) Olympus colonoscope was                            introduced through the anus and advanced to the the                            cecum, identified by appendiceal orifice and                            ileocecal valve. The colonoscopy was performed with                            difficulty due to significant looping, a tortuous                            colon and fair prep. Successful completion of the                            procedure was aided by straightening and shortening                            the scope to obtain bowel loop reduction, applying                            abdominal pressure and lavage. The patient                            tolerated the procedure well. The quality of the  bowel preparation was fair. The ileocecal valve,                            appendiceal orifice, and rectum were photographed. Scope In: 10:15:43 AM Scope Out: 10:45:00 AM Scope Withdrawal Time: 0 hours 22 minutes 50 seconds  Total Procedure Duration: 0 hours 29 minutes 17 seconds  Findings:      The perianal and digital rectal examinations were normal.      A 3 mm polyp was found in the proximal ascending colon. The polyp was       semi-sessile. The polyp was removed with a cold biopsy forceps.       Resection and retrieval were complete. Estimated blood loss was minimal.      A tattoo was seen in the ascending colon (residual polyp found near       tattoo - image #6).      A 7 mm polyp was found in the ascending colon. The polyp was sessile.       The polyp was removed with a hot snare. Resection and retrieval were       complete. Estimated blood loss: none.      There was a medium-sized lipoma, in the  ascending colon.      A 6 mm polyp was found in the sigmoid colon. The polyp was sessile. The       polyp was removed with a hot snare. Resection and retrieval were       complete. Estimated blood loss: none.      Internal hemorrhoids were found during retroflexion. The hemorrhoids       were medium-sized and Grade I (internal hemorrhoids that do not       prolapse).      A diffuse area of moderate melanosis was found in the entire colon. Impression:               - Preparation of the colon was fair.                           - One 3 mm polyp in the proximal ascending colon,                            removed with a cold biopsy forceps. Resected and                            retrieved.                           - A tattoo was seen in the ascending colon.                           - One 7 mm polyp in the ascending colon, removed                            with a hot snare. Resected and retrieved.                           - Medium-sized lipoma in the ascending colon.                           -  One 6 mm polyp in the sigmoid colon, removed with                            a hot snare. Resected and retrieved.                           - Internal hemorrhoids.                           - Melanosis in the colon. Moderate Sedation:      N/A - MAC procedure Recommendation:           - Patient has a contact number available for                            emergencies. The signs and symptoms of potential                            delayed complications were discussed with the                            patient. Return to normal activities tomorrow.                            Written discharge instructions were provided to the                            patient.                           - High fiber diet.                           - Await pathology results.                           - Repeat colonoscopy for surveillance based on                            pathology results.                            - No ibuprofen, naproxen, or other non-steroidal                            anti-inflammatory drugs for 1 week. Procedure Code(s):        --- Professional ---                           [id number], Colonoscopy, flexible; with removal of                            tumor(s), polyp(s), or other lesion(s) by snare                            technique  54098, 70, Colonoscopy, flexible; with biopsy,                            single or multiple Diagnosis Code(s):        --- Professional ---                           Z86.010, Personal history of colonic polyps                           K63.5, Polyp of colon                           K64.0, First degree hemorrhoids                           D17.5, Benign lipomatous neoplasm of                            intra-abdominal organs                           K63.89, Other specified diseases of intestine CPT copyright 2019 [employer]. All rights reserved. The codes documented in this report are preliminary and upon coder review may  be revised to meet current compliance requirements. Lear Ng, MD 08/12/2021 10:53:37 AM This report has been signed electronically. Number of Addenda: 0

## 2021-08-12 NOTE — Anesthesia Preprocedure Evaluation (Addendum)
Anesthesia Evaluation  Patient identified by MRN, date of birth, ID band Patient awake    Reviewed: Allergy & Precautions, NPO status , Patient's Chart, lab work & pertinent test results  History of Anesthesia Complications Negative for: history of anesthetic complications  Airway Mallampati: III  TM Distance: >3 FB Neck ROM: Full    Dental no notable dental hx. (+) Dental Advisory Given   Pulmonary neg pulmonary ROS,    Pulmonary exam normal        Cardiovascular hypertension, Pt. on medications Normal cardiovascular exam  Lexiscan/modified Bruce Tetrofosmin stress test 06/09/2021: No previous exam available for comparison. Lexiscan/modified Bruce nuclear stress test performed using 1-day protocol.  Normal myocardial perfusion. Stress LVEF 68%. Low risk study. Echocardiogram 06/30/2020:    Left ventricle cavity is normal in size. Moderate concentric remodeling of  the left ventricle. Normal global wall motion. Doppler evidence of grade I  (impaired) diastolic dysfunction, normal LAP. Normal LV systolic function  with visual EF 55-60%. Calculated EF 54%.  Structurally normal tricuspid valve with trace regurgitation. No evidence  of tricuspid valve stenosis. No evidence of pulmonary hypertension.   Neuro/Psych PSYCHIATRIC DISORDERS Depression negative neurological ROS     GI/Hepatic Neg liver ROS, GERD  ,  Endo/Other  Morbid obesitySLE  Renal/GU negative Renal ROS     Musculoskeletal  (+) Arthritis , Fibromyalgia -, narcotic dependent  Abdominal   Peds  Hematology negative hematology ROS (+)   Anesthesia Other Findings   Reproductive/Obstetrics                            Anesthesia Physical Anesthesia Plan  ASA: 3  Anesthesia Plan: MAC   Post-op Pain Management: Minimal or no pain anticipated   Induction:   PONV Risk Score and Plan: 2 and Ondansetron and Propofol  infusion  Airway Management Planned: Natural Airway, Nasal Cannula and Simple Face Mask  Additional Equipment:   Intra-op Plan:   Post-operative Plan:   Informed Consent: I have reviewed the patients History and Physical, chart, labs and discussed the procedure including the risks, benefits and alternatives for the proposed anesthesia with the patient or authorized representative who has indicated his/her understanding and acceptance.     Dental advisory given  Plan Discussed with: Anesthesiologist  Anesthesia Plan Comments:        Anesthesia Quick Evaluation

## 2021-08-13 ENCOUNTER — Encounter (HOSPITAL_COMMUNITY): Payer: Self-pay | Admitting: Gastroenterology

## 2021-08-13 LAB — SURGICAL PATHOLOGY

## 2021-08-26 DIAGNOSIS — K317 Polyp of stomach and duodenum: Secondary | ICD-10-CM | POA: Diagnosis not present

## 2021-08-27 DIAGNOSIS — F4323 Adjustment disorder with mixed anxiety and depressed mood: Secondary | ICD-10-CM | POA: Diagnosis not present

## 2021-09-02 DIAGNOSIS — F4323 Adjustment disorder with mixed anxiety and depressed mood: Secondary | ICD-10-CM | POA: Diagnosis not present

## 2021-09-24 ENCOUNTER — Encounter: Payer: Self-pay | Admitting: Physical Medicine and Rehabilitation

## 2021-09-24 ENCOUNTER — Encounter
Payer: Medicare Other | Attending: Physical Medicine and Rehabilitation | Admitting: Physical Medicine and Rehabilitation

## 2021-09-24 VITALS — BP 124/85 | HR 77 | Ht 62.0 in | Wt 214.0 lb

## 2021-09-24 DIAGNOSIS — G894 Chronic pain syndrome: Secondary | ICD-10-CM | POA: Insufficient documentation

## 2021-09-24 DIAGNOSIS — M797 Fibromyalgia: Secondary | ICD-10-CM | POA: Insufficient documentation

## 2021-09-24 DIAGNOSIS — Z79891 Long term (current) use of opiate analgesic: Secondary | ICD-10-CM | POA: Insufficient documentation

## 2021-09-24 DIAGNOSIS — M5030 Other cervical disc degeneration, unspecified cervical region: Secondary | ICD-10-CM | POA: Diagnosis not present

## 2021-09-24 DIAGNOSIS — Z5181 Encounter for therapeutic drug level monitoring: Secondary | ICD-10-CM | POA: Insufficient documentation

## 2021-09-24 MED ORDER — TRAMADOL HCL 50 MG PO TABS: 50.0000 mg | ORAL_TABLET | Freq: Three times a day (TID) | ORAL | 5 refills | Status: DC | PRN

## 2021-09-24 MED ORDER — PREGABALIN 100 MG PO CAPS: 100.0000 mg | ORAL_CAPSULE | Freq: Two times a day (BID) | ORAL | 1 refills | Status: DC
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
## 2021-09-24 NOTE — Patient Instructions (Signed)
Pt is a 61 yr old with severe depression- doesn't think has bipolar d/o per pt, allodynia, Lupus, fibromyalgia, and myofascial pain syndrome, and piriformis syndrome as well as bladder spasms and L4/5 disc issues with need for epidural - s/p epidural February 2022- ?Here for f/u on chronic pain - last EPI transforaminal steroid injection S1 Left injection 03/27/21. ? ?Suggest checking with Dr Ernestina Patches about S1 joint injection- was done 07/29/21- lasted ~ 2 months this time.  ? ?2. Con't Tramadol- got in January -so wil refill.  ? ? ?3. Con't Lyrica 100 mg BID- send in refills- last ordered 06/25/21.  ? ? ?4. Con't Gabapentin- but doesn't need refills. 400 mg TID ? ? ?5. Wait to change meds up since getting Latuda changed sometime soon and also changed Lexapro around last month- was very hard on pt.  ? ?6. Will get UDS today- is due ? ? ?7. F/U in 3 months.  ?

## 2021-09-29 DIAGNOSIS — B9681 Helicobacter pylori [H. pylori] as the cause of diseases classified elsewhere: Secondary | ICD-10-CM | POA: Diagnosis not present

## 2021-09-29 DIAGNOSIS — I10 Essential (primary) hypertension: Secondary | ICD-10-CM | POA: Diagnosis not present

## 2021-09-29 DIAGNOSIS — K259 Gastric ulcer, unspecified as acute or chronic, without hemorrhage or perforation: Secondary | ICD-10-CM | POA: Diagnosis not present

## 2021-09-29 DIAGNOSIS — K317 Polyp of stomach and duodenum: Secondary | ICD-10-CM | POA: Diagnosis not present

## 2021-09-29 DIAGNOSIS — K2950 Unspecified chronic gastritis without bleeding: Secondary | ICD-10-CM | POA: Diagnosis not present

## 2021-09-30 ENCOUNTER — Other Ambulatory Visit: Payer: Self-pay

## 2021-09-30 DIAGNOSIS — M3219 Other organ or system involvement in systemic lupus erythematosus: Secondary | ICD-10-CM

## 2021-09-30 LAB — TOXASSURE SELECT,+ANTIDEPR,UR

## 2021-09-30 MED ORDER — HYDROXYCHLOROQUINE SULFATE 200 MG PO TABS: ORAL_TABLET | ORAL | 0 refills | Status: DC

## 2021-09-30 NOTE — Telephone Encounter (Signed)
Next Visit: 12/26/2021 ? ?Last Visit: 07/08/2021 ? ?Labs: 07/08/2021, Hydroxychloroquine level is subtherapeutic.  It is lower than before.  I question if patient is taking hydroxychloroquine on a regular basis.  Please ask if she is not taking it on a regular basis she can take twice a day Monday to Friday.  If she is taking hydroxychloroquine twice daily Monday to Friday on a regular basis then we will have to increase the dose of hydroxychloroquine to 1 tablet p.o. twice daily and recheck levels in 3 months. Double-stranded DNA is low titer positive which is unchanged, anticardiolipin antibodies are all positive, lupus anticoagulant is negative, anticardiolipin antibodies are positive, glucose is  elevated, liver function is elevated and stable, CBC is normal, UA negative, complements are normal, sed rate is normal.  Labs do not indicate disease flare.  Patient should continue Plaquenil and aspirin.  We will adjust the dose of Plaquenil based on the Plaquenil level when available Glucose is 143.  Rest of CMP WNL.  CBC WNL.  ESR WNL.  UA normal.  Complements WNL.  ? ?Eye exam: 08/08/2021  ? ?Current Dose per office note 07/08/2021 lab note, increase the dose of hydroxychloroquine to 1 tablet p.o. twice daily and recheck levels in 3 months.  ? ?AX:ENMMH systemic lupus erythematosus with other organ involvement  ? ?Last Fill: 07/18/2021 ? ?Okay to refill Plaquenil? ? ?

## 2021-09-30 NOTE — Telephone Encounter (Signed)
Patient called requesting prescription refill of Hydroxychloroquine to be sent to [facility].   ?

## 2021-10-01 DIAGNOSIS — F4323 Adjustment disorder with mixed anxiety and depressed mood: Secondary | ICD-10-CM | POA: Diagnosis not present

## 2021-10-09 ENCOUNTER — Telehealth: Payer: Self-pay | Admitting: *Deleted

## 2021-10-09 NOTE — Telephone Encounter (Signed)
Urine drug screen for this encounter is consistent for prescribed medication 

## 2021-10-14 DIAGNOSIS — F4323 Adjustment disorder with mixed anxiety and depressed mood: Secondary | ICD-10-CM | POA: Diagnosis not present

## 2021-10-28 ENCOUNTER — Telehealth: Payer: Self-pay | Admitting: Physical Medicine and Rehabilitation

## 2021-10-28 DIAGNOSIS — M5416 Radiculopathy, lumbar region: Secondary | ICD-10-CM

## 2021-10-28 NOTE — Telephone Encounter (Signed)
Patient called. She would like an appointment with Dr. Newton.  

## 2021-10-29 DIAGNOSIS — F4323 Adjustment disorder with mixed anxiety and depressed mood: Secondary | ICD-10-CM | POA: Diagnosis not present

## 2021-10-29 NOTE — Addendum Note (Signed)
Addended byLaurann Montana on: 10/29/2021 01:06 PM ? ? Modules accepted: Orders ? ?

## 2021-10-29 NOTE — Telephone Encounter (Signed)
Scheduled for 06/08 and message sent to Terri in case auth needed ?

## 2021-11-11 DIAGNOSIS — L309 Dermatitis, unspecified: Secondary | ICD-10-CM | POA: Diagnosis not present

## 2021-11-11 DIAGNOSIS — L304 Erythema intertrigo: Secondary | ICD-10-CM | POA: Diagnosis not present

## 2021-11-11 DIAGNOSIS — R7303 Prediabetes: Secondary | ICD-10-CM | POA: Diagnosis not present

## 2021-11-11 DIAGNOSIS — A6000 Herpesviral infection of urogenital system, unspecified: Secondary | ICD-10-CM | POA: Diagnosis not present

## 2021-11-11 DIAGNOSIS — I10 Essential (primary) hypertension: Secondary | ICD-10-CM | POA: Diagnosis not present

## 2021-11-25 DIAGNOSIS — F4323 Adjustment disorder with mixed anxiety and depressed mood: Secondary | ICD-10-CM | POA: Diagnosis not present

## 2021-11-27 ENCOUNTER — Ambulatory Visit: Payer: Medicare Other | Admitting: Physical Medicine and Rehabilitation

## 2021-11-27 ENCOUNTER — Ambulatory Visit: Payer: Self-pay

## 2021-11-27 ENCOUNTER — Encounter: Payer: Self-pay | Admitting: Physical Medicine and Rehabilitation

## 2021-11-27 VITALS — BP 141/71 | HR 75

## 2021-11-27 DIAGNOSIS — M5416 Radiculopathy, lumbar region: Secondary | ICD-10-CM

## 2021-11-27 MED ORDER — METHYLPREDNISOLONE ACETATE 80 MG/ML IJ SUSP
80.0000 mg | Freq: Once | INTRAMUSCULAR | Status: AC
  Administered 2021-11-27: 80 mg

## 2021-11-27 NOTE — Progress Notes (Signed)
Pt state lower back pain. Pt state walking, standing and sitting makes the pain worse. Pt state she takes pain meds to help ease her pain.  Numeric Pain Rating Scale and Functional Assessment Average Pain 4   In the last MONTH (on 0-10 scale) has pain interfered with the following?  1. General activity like being  able to carry out your everyday physical activities such as walking, climbing stairs, carrying groceries, or moving a chair?  Rating(10)   +Driver, -BT, -Dye Allergies.

## 2021-11-27 NOTE — Patient Instructions (Signed)

## 2021-12-03 DIAGNOSIS — L308 Other specified dermatitis: Secondary | ICD-10-CM | POA: Diagnosis not present

## 2021-12-03 DIAGNOSIS — D2272 Melanocytic nevi of left lower limb, including hip: Secondary | ICD-10-CM | POA: Diagnosis not present

## 2021-12-03 DIAGNOSIS — D485 Neoplasm of uncertain behavior of skin: Secondary | ICD-10-CM | POA: Diagnosis not present

## 2021-12-03 DIAGNOSIS — L821 Other seborrheic keratosis: Secondary | ICD-10-CM | POA: Diagnosis not present

## 2021-12-03 DIAGNOSIS — L304 Erythema intertrigo: Secondary | ICD-10-CM | POA: Diagnosis not present

## 2021-12-08 ENCOUNTER — Ambulatory Visit: Payer: Medicare Other | Admitting: Rheumatology
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
## 2021-12-26 ENCOUNTER — Ambulatory Visit: Payer: Medicare Other | Admitting: Rheumatology

## 2021-12-26 ENCOUNTER — Encounter: Payer: Self-pay | Admitting: Rheumatology

## 2021-12-26 VITALS — BP 144/87 | HR 66 | Ht 61.5 in | Wt 211.0 lb

## 2021-12-26 DIAGNOSIS — M3219 Other organ or system involvement in systemic lupus erythematosus: Secondary | ICD-10-CM

## 2021-12-26 DIAGNOSIS — R760 Raised antibody titer: Secondary | ICD-10-CM | POA: Diagnosis not present

## 2021-12-26 DIAGNOSIS — Z8269 Family history of other diseases of the musculoskeletal system and connective tissue: Secondary | ICD-10-CM

## 2021-12-26 DIAGNOSIS — M19041 Primary osteoarthritis, right hand: Secondary | ICD-10-CM | POA: Diagnosis not present

## 2021-12-26 DIAGNOSIS — R748 Abnormal levels of other serum enzymes: Secondary | ICD-10-CM

## 2021-12-26 DIAGNOSIS — R21 Rash and other nonspecific skin eruption: Secondary | ICD-10-CM

## 2021-12-26 DIAGNOSIS — K219 Gastro-esophageal reflux disease without esophagitis: Secondary | ICD-10-CM

## 2021-12-26 DIAGNOSIS — R296 Repeated falls: Secondary | ICD-10-CM

## 2021-12-26 DIAGNOSIS — M8589 Other specified disorders of bone density and structure, multiple sites: Secondary | ICD-10-CM

## 2021-12-26 DIAGNOSIS — E66812 Obesity, class 2: Secondary | ICD-10-CM

## 2021-12-26 DIAGNOSIS — M5030 Other cervical disc degeneration, unspecified cervical region: Secondary | ICD-10-CM

## 2021-12-26 DIAGNOSIS — M51369 Other intervertebral disc degeneration, lumbar region without mention of lumbar back pain or lower extremity pain: Secondary | ICD-10-CM

## 2021-12-26 DIAGNOSIS — K760 Fatty (change of) liver, not elsewhere classified: Secondary | ICD-10-CM

## 2021-12-26 DIAGNOSIS — E669 Obesity, unspecified: Secondary | ICD-10-CM

## 2021-12-26 DIAGNOSIS — R5383 Other fatigue: Secondary | ICD-10-CM

## 2021-12-26 DIAGNOSIS — M797 Fibromyalgia: Secondary | ICD-10-CM

## 2021-12-26 DIAGNOSIS — I10 Essential (primary) hypertension: Secondary | ICD-10-CM

## 2021-12-26 DIAGNOSIS — G252 Other specified forms of tremor: Secondary | ICD-10-CM

## 2021-12-26 DIAGNOSIS — F5101 Primary insomnia: Secondary | ICD-10-CM

## 2021-12-26 DIAGNOSIS — Z79899 Other long term (current) drug therapy: Secondary | ICD-10-CM | POA: Diagnosis not present

## 2021-12-26 DIAGNOSIS — M5136 Other intervertebral disc degeneration, lumbar region: Secondary | ICD-10-CM

## 2021-12-26 DIAGNOSIS — E559 Vitamin D deficiency, unspecified: Secondary | ICD-10-CM

## 2021-12-26 DIAGNOSIS — M19042 Primary osteoarthritis, left hand: Secondary | ICD-10-CM

## 2021-12-26 MED ORDER — HYDROXYCHLOROQUINE SULFATE 200 MG PO TABS: ORAL_TABLET | ORAL | 0 refills | Status: DC

## 2021-12-26 NOTE — Patient Instructions (Signed)
Standing Labs We placed an order today for your standing lab work.   Please have your standing labs drawn in October and every 3 months  If possible, please have your labs drawn 2 weeks prior to your appointment so that the provider can discuss your results at your appointment.  Please note that you may see your imaging and lab results in MyChart before we have reviewed them. We may be awaiting multiple results to interpret others before contacting you. Please allow our office up to 72 hours to thoroughly review all of the results before contacting the office for clarification of your results.  We have open lab daily: Monday through Thursday from 1:30-4:30 PM and Friday from 1:30-4:00 PM at the office of Dr. Jayzen Paver, [facility].   Please be advised, all patients with office appointments requiring lab work will take precedent over walk-in lab work.  If possible, please come for your lab work on Monday and Friday afternoons, as you may experience shorter wait times. The office is located at [address], [address], [place], [place] [address] No appointment is necessary.   Labs are drawn by [facility]. Please bring your co-pay at the time of your lab draw.  You may receive a bill from [facility] for your lab work.  Please note if you are on Hydroxychloroquine and and an order has been placed for a Hydroxychloroquine level, you will need to have it drawn 4 hours or more after your last dose.  If you wish to have your labs drawn at another location, please call the office 24 hours in advance to send orders.  If you have any questions regarding directions or hours of operation,  please call [phone].   As a reminder, please drink plenty of water prior to coming for your lab work. Thanks!   Vaccines You are taking a medication(s) that can suppress your immune system.  The following immunizations are recommended: Flu annually Covid-19  Td/Tdap (tetanus, diphtheria,  pertussis) every 10 years Pneumonia (Prevnar 15 then Pneumovax 23 at least 1 year apart.  Alternatively, can take Prevnar 20 without needing additional dose) Shingrix: 2 doses from 4 weeks to 6 months apart  Please check with your PCP to make sure you are up to date.  

## 2021-12-30 DIAGNOSIS — L438 Other lichen planus: Secondary | ICD-10-CM | POA: Diagnosis not present

## 2021-12-30 DIAGNOSIS — D485 Neoplasm of uncertain behavior of skin: Secondary | ICD-10-CM | POA: Diagnosis not present

## 2021-12-30 DIAGNOSIS — D2272 Melanocytic nevi of left lower limb, including hip: Secondary | ICD-10-CM | POA: Diagnosis not present

## 2021-12-31 DIAGNOSIS — F4323 Adjustment disorder with mixed anxiety and depressed mood: Secondary | ICD-10-CM | POA: Diagnosis not present

## 2022-01-01 ENCOUNTER — Telehealth: Payer: Self-pay | Admitting: Rheumatology

## 2022-01-01 LAB — COMPLETE METABOLIC PANEL WITH GFR
AG Ratio: 1.7 (calc) (ref 1.0–2.5)
ALT: 28 U/L (ref 6–29)
AST: 24 U/L (ref 10–35)
Albumin: 4.2 g/dL (ref 3.6–5.1)
Alkaline phosphatase (APISO): 75 U/L (ref 37–153)
BUN: 7 mg/dL (ref 7–25)
CO2: 24 mmol/L (ref 20–32)
Calcium: 8.9 mg/dL (ref 8.6–10.4)
Chloride: 100 mmol/L (ref 98–110)
Creat: 0.99 mg/dL (ref 0.50–1.05)
Globulin: 2.5 g/dL (calc) (ref 1.9–3.7)
Glucose, Bld: 121 mg/dL — ABNORMAL HIGH (ref 65–99)
Potassium: 4.1 mmol/L (ref 3.5–5.3)
Sodium: 136 mmol/L (ref 135–146)
Total Bilirubin: 0.6 mg/dL (ref 0.2–1.2)
Total Protein: 6.7 g/dL (ref 6.1–8.1)
eGFR: 65 mL/min/{1.73_m2} (ref >=60)

## 2022-01-01 LAB — PROTEIN / CREATININE RATIO, URINE
Creatinine, Urine: 235 mg/dL (ref 20–275)
Protein/Creat Ratio: 106 mg/g creat (ref 24–184)
Protein/Creatinine Ratio: 0.106 mg/mg creat (ref 0.024–0.184)
Total Protein, Urine: 25 mg/dL — ABNORMAL HIGH (ref 5–24)

## 2022-01-01 LAB — CBC WITH DIFFERENTIAL/PLATELET
Absolute Monocytes: 343 cells/uL (ref 200–950)
Basophils Absolute: 42 cells/uL (ref 0–200)
Basophils Relative: 0.8 %
Eosinophils Absolute: 21 cells/uL (ref 15–500)
Eosinophils Relative: 0.4 %
HCT: 41.4 % (ref 35.0–45.0)
Hemoglobin: 13.8 g/dL (ref 11.7–15.5)
Lymphs Abs: 686 cells/uL — ABNORMAL LOW (ref 850–3900)
MCH: 31.5 pg (ref 27.0–33.0)
MCHC: 33.3 g/dL (ref 32.0–36.0)
MCV: 94.5 fL (ref 80.0–100.0)
MPV: 10.7 fL (ref 7.5–12.5)
Monocytes Relative: 6.6 %
Neutro Abs: 4108 cells/uL (ref 1500–7800)
Neutrophils Relative %: 79 %
Platelets: 174 10*3/uL (ref 140–400)
RBC: 4.38 10*6/uL (ref 3.80–5.10)
RDW: 13 % (ref 11.0–15.0)
Total Lymphocyte: 13.2 %
WBC: 5.2 10*3/uL (ref 3.8–10.8)

## 2022-01-01 LAB — CK: Total CK: 208 U/L — ABNORMAL HIGH (ref 29–143)

## 2022-01-01 LAB — BETA-2 GLYCOPROTEIN ANTIBODIES
Beta-2 Glyco 1 IgA: 29.7 U/mL — ABNORMAL HIGH (ref <20.0)
Beta-2 Glyco 1 IgM: 49.4 U/mL — ABNORMAL HIGH (ref <20.0)
Beta-2 Glyco I IgG: 51.7 U/mL — ABNORMAL HIGH (ref <20.0)

## 2022-01-01 LAB — C3 AND C4
C3 Complement: 138 mg/dL (ref 83–193)
C4 Complement: 23 mg/dL (ref 15–57)

## 2022-01-01 LAB — SEDIMENTATION RATE: Sed Rate: 11 mm/h (ref 0–30)

## 2022-01-01 LAB — ANTI-DNA ANTIBODY, DOUBLE-STRANDED: ds DNA Ab: 5 IU/mL — ABNORMAL HIGH

## 2022-01-01 LAB — CARDIOLIPIN ANTIBODIES, IGG, IGM, IGA
Anticardiolipin IgA: 32.3 APL-U/mL — ABNORMAL HIGH (ref <20.0)
Anticardiolipin IgG: 17.4 GPL-U/mL (ref <20.0)
Anticardiolipin IgM: 48.7 MPL-U/mL — ABNORMAL HIGH (ref <20.0)

## 2022-01-01 NOTE — Telephone Encounter (Signed)
Patient left a voicemail stating she was returning Sharon's call regarding her labwork results.

## 2022-01-01 NOTE — Progress Notes (Signed)
Urine protein negative, CBC and CMP are stable.  Double-stranded DNA is low positive and stable beta-2 GP 1 and anticardiolipin titers are positive and stable.  CK is mildly elevated and stable.  Complements are normal.  Sed rate is normal.  Labs do not indicate a disease flare.  Patient should continue current treatment.

## 2022-01-01 NOTE — Telephone Encounter (Signed)
See lab note for details.  

## 2022-01-07 DIAGNOSIS — F4323 Adjustment disorder with mixed anxiety and depressed mood: Secondary | ICD-10-CM | POA: Diagnosis not present

## 2022-01-12 ENCOUNTER — Encounter
Payer: Medicare Other | Attending: Physical Medicine and Rehabilitation | Admitting: Physical Medicine and Rehabilitation

## 2022-01-12 ENCOUNTER — Encounter: Payer: Self-pay | Admitting: Physical Medicine and Rehabilitation

## 2022-01-12 VITALS — BP 154/89 | HR 103 | Ht 61.5 in | Wt 214.6 lb

## 2022-01-12 DIAGNOSIS — Z79891 Long term (current) use of opiate analgesic: Secondary | ICD-10-CM | POA: Insufficient documentation

## 2022-01-12 DIAGNOSIS — Z5181 Encounter for therapeutic drug level monitoring: Secondary | ICD-10-CM | POA: Insufficient documentation

## 2022-01-12 DIAGNOSIS — M797 Fibromyalgia: Secondary | ICD-10-CM | POA: Insufficient documentation

## 2022-01-12 DIAGNOSIS — M79672 Pain in left foot: Secondary | ICD-10-CM | POA: Insufficient documentation

## 2022-01-12 DIAGNOSIS — G894 Chronic pain syndrome: Secondary | ICD-10-CM | POA: Insufficient documentation

## 2022-01-12 DIAGNOSIS — M79671 Pain in right foot: Secondary | ICD-10-CM | POA: Diagnosis not present

## 2022-01-12 MED ORDER — BUPRENORPHINE 7.5 MCG/HR TD PTWK: 1.0000 | MEDICATED_PATCH | TRANSDERMAL | 0 refills | Status: DC
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
## 2022-01-12 NOTE — Patient Instructions (Signed)
Pt is a 61 yr old with severe depression- doesn't think has bipolar d/o per pt, allodynia, Lupus, fibromyalgia, and myofascial pain syndrome, and piriformis syndrome as well as bladder spasms and L4/5 disc issues with need for epidural - s/p epidural February 2022- Here for f/u on chronic pain - last EPI transforaminal steroid injection S1 Left injection 11/27/21 by Dr Ernestina Patches.  Wondering if has a myopathy?  Con't Tramadol 50 mg 3x/day as needed- has 10 left and last fill 35 days ago- has 3 months of refills left.   2. Suggest an exercise ball- sitting on it- 30 minutes/day- start with 10-15 minutes/day.  Can get at [facility]- can find in yoga area- knees need to be at 90 degrees to be at the correct level- blow it up so sitting on it evenly.   3. I think that will help your posture, which can help your back pain.    4. I'm not sure if foot pain is neuropathy related- but I think podiatry could be helpful to make sure not an anatomical foot issue-referral to podiatry.   5. Discussed how Fibromyalgia can cause flares- esp if weather changes or rainy weather etc.   6. Strongly think that Nucynta would be effective for nerve pain that pt has, but have to go through many drugs before get there- will see if can try Butrans patch, because majority of pt's pain is nerve related and opiates like Norco are not very effective for nerve pain- don't want to overdose pt.   7. Will try Butrans patch 7.5 mcg weekly. Apply and wear for 7 days and change every 7 days. Has tried multiple nerve pain agents- including gabapentin and lyrica- cannot take Cymbalta due to psychiatric diagnoses; also cannot do Trileptal or Keppra due to psych diagnoses.  Don't do tramadol at same time as Butrans patch.  MS contin/etc will not be helpful since they don't treat nerve pain.    8. Suggest speaking to Rheumatology about possibly myopathy. If they cannot diagnose or assess, will send to Neurology.   9. F/U in 3 months- call in  1 month to let me know how things going.

## 2022-01-13 ENCOUNTER — Telehealth: Payer: Self-pay

## 2022-01-13 NOTE — Telephone Encounter (Signed)
PA approved for Buprenorphine 01/12/22-01/13/23

## 2022-01-15 LAB — TOXASSURE SELECT,+ANTIDEPR,UR

## 2022-01-28 DIAGNOSIS — F4323 Adjustment disorder with mixed anxiety and depressed mood: Secondary | ICD-10-CM | POA: Diagnosis not present

## 2022-01-30 ENCOUNTER — Ambulatory Visit (INDEPENDENT_AMBULATORY_CARE_PROVIDER_SITE_OTHER): Payer: Medicare Other

## 2022-01-30 ENCOUNTER — Ambulatory Visit: Payer: Medicare Other | Admitting: Podiatry

## 2022-01-30 DIAGNOSIS — M722 Plantar fascial fibromatosis: Secondary | ICD-10-CM

## 2022-01-30 DIAGNOSIS — D3613 Benign neoplasm of peripheral nerves and autonomic nervous system of lower limb, including hip: Secondary | ICD-10-CM | POA: Diagnosis not present

## 2022-01-30 DIAGNOSIS — D3610 Benign neoplasm of peripheral nerves and autonomic nervous system, unspecified: Secondary | ICD-10-CM

## 2022-01-30 MED ORDER — TRIAMCINOLONE ACETONIDE 10 MG/ML IJ SUSP
10.0000 mg | Freq: Once | INTRAMUSCULAR | Status: AC
  Administered 2022-01-30: 10 mg

## 2022-01-30 NOTE — Progress Notes (Unsigned)
Subjective:   Patient ID: Terri Hurley, female   DOB: 61 y.o.   MRN: [id number]   HPI Chief Complaint  Patient presents with   Foot Pain    Pt came in for bilateral foot pain the ball of the foot and around the perimeter of the foot, rate of pain 9 out of 10, left heel also has some pain, TX: stretching, pain meds     Started a few months ago. As far as standing she can stand for about 10 min and she starts to get pain to the heel. She cannot stand in one place. No treatment. No injuries. She has tried pain medication that she has for her fibromyalgia. The left foot plantar fasciitis and ball of the foot seems to bother her. The pain in the bottom of the toes is bad and it feels like nerve pain.    ROS      Objective:  Physical Exam  *** General: AAO x3, NAD  Dermatological: Skin is warm, dry and supple bilateral. Nails x 10 are well manicured; remaining integument appears unremarkable at this time. There are no open sores, no preulcerative lesions, no rash or signs of infection present.  Vascular: Dorsalis Pedis artery and Posterior Tibial artery pedal pulses are 2/4 bilateral with immedate capillary fill time. Pedal hair growth present. No varicosities and no lower extremity edema present bilateral. There is no pain with calf compression, swelling, warmth, erythema.   Neruologic: decreased doral left foot    Musculoskeletal: No gross boney pedal deformities bilateral. No pain, crepitus, or limitation noted with foot and ankle range of motion bilateral. Muscular strength 5/5 in all groups tested bilateral.  Gait: Unassisted, Nonantalgic.      Assessment:  ***     Plan:  -Treatment options discussed including all alternatives, risks, and complications -Etiology of symptoms were discussed -X-rays were obtained and reviewed with the patient.  -has custom inserts, bring next time to add met pad -injection left 3rd is

## 2022-01-30 NOTE — Patient Instructions (Signed)

## 2022-02-04 ENCOUNTER — Other Ambulatory Visit: Payer: Self-pay | Admitting: Family Medicine

## 2022-02-04 DIAGNOSIS — Z1231 Encounter for screening mammogram for malignant neoplasm of breast: Secondary | ICD-10-CM

## 2022-02-04 DIAGNOSIS — I10 Essential (primary) hypertension: Secondary | ICD-10-CM | POA: Diagnosis not present

## 2022-02-04 DIAGNOSIS — K219 Gastro-esophageal reflux disease without esophagitis: Secondary | ICD-10-CM | POA: Diagnosis not present

## 2022-02-04 DIAGNOSIS — A6000 Herpesviral infection of urogenital system, unspecified: Secondary | ICD-10-CM | POA: Diagnosis not present

## 2022-02-18 DIAGNOSIS — F4323 Adjustment disorder with mixed anxiety and depressed mood: Secondary | ICD-10-CM | POA: Diagnosis not present

## 2022-03-03 ENCOUNTER — Ambulatory Visit
Admission: RE | Admit: 2022-03-03 | Discharge: 2022-03-03 | Disposition: A | Discharge status: Home / Self Care | Payer: Medicare Other | Source: Ambulatory Visit | Attending: Family Medicine | Admitting: Family Medicine

## 2022-03-03 DIAGNOSIS — Z1231 Encounter for screening mammogram for malignant neoplasm of breast: Secondary | ICD-10-CM | POA: Diagnosis not present

## 2022-03-04 DIAGNOSIS — F4323 Adjustment disorder with mixed anxiety and depressed mood: Secondary | ICD-10-CM | POA: Diagnosis not present

## 2022-03-13 ENCOUNTER — Other Ambulatory Visit: Payer: Self-pay | Admitting: Physical Medicine and Rehabilitation

## 2022-03-17 ENCOUNTER — Telehealth: Payer: Self-pay | Admitting: Physical Medicine and Rehabilitation
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
## 2022-03-17 NOTE — Telephone Encounter (Signed)
Order was inputted

## 2022-03-17 NOTE — Telephone Encounter (Signed)
Pt wouldl ike to repeat injection. No new pain and worked about 80% and is just now wearing off.

## 2022-03-20 ENCOUNTER — Ambulatory Visit: Payer: Medicare Other | Admitting: Podiatry

## 2022-03-20 DIAGNOSIS — M792 Neuralgia and neuritis, unspecified: Secondary | ICD-10-CM | POA: Diagnosis not present

## 2022-03-20 DIAGNOSIS — M722 Plantar fascial fibromatosis: Secondary | ICD-10-CM | POA: Diagnosis not present

## 2022-03-20 NOTE — Patient Instructions (Addendum)
For topical medications I would try VOLTAREN GEL or  CAPSAICIN CREAM.   For vitamins I like a B-COMPLEX VITAMIN and ALPHA LIPOIC ACID. You can also use TUMERIC as a natural anti-inflammatory.   Plantar Fasciitis (Heel Spur Syndrome) with Rehab The plantar fascia is a fibrous, ligament-like, soft-tissue structure that spans the bottom of the foot. Plantar fasciitis is a condition that causes pain in the foot due to inflammation of the tissue. SYMPTOMS  Pain and tenderness on the underneath side of the foot. Pain that worsens with standing or walking. CAUSES  Plantar fasciitis is caused by irritation and injury to the plantar fascia on the underneath side of the foot. Common mechanisms of injury include: Direct trauma to bottom of the foot. Damage to a small nerve that runs under the foot where the main fascia attaches to the heel bone. Stress placed on the plantar fascia due to bone spurs. RISK INCREASES WITH:  Activities that place stress on the plantar fascia (running, jumping, pivoting, or cutting). Poor strength and flexibility. Improperly fitted shoes. Tight calf muscles. Flat feet. Failure to warm-up properly before activity. Obesity. PREVENTION Warm up and stretch properly before activity. Allow for adequate recovery between workouts. Maintain physical fitness: Strength, flexibility, and endurance. Cardiovascular fitness. Maintain a health body weight. Avoid stress on the plantar fascia. Wear properly fitted shoes, including arch supports for individuals who have flat feet.  PROGNOSIS  If treated properly, then the symptoms of plantar fasciitis usually resolve without surgery. However, occasionally surgery is necessary.  RELATED COMPLICATIONS  Recurrent symptoms that may result in a chronic condition. Problems of the lower back that are caused by compensating for the injury, such as limping. Pain or weakness of the foot during push-off following surgery. Chronic  inflammation, scarring, and partial or complete fascia tear, occurring more often from repeated injections.  TREATMENT  Treatment initially involves the use of ice and medication to help reduce pain and inflammation. The use of strengthening and stretching exercises may help reduce pain with activity, especially stretches of the Achilles tendon. These exercises may be performed at home or with a therapist. Your caregiver may recommend that you use heel cups of arch supports to help reduce stress on the plantar fascia. Occasionally, corticosteroid injections are given to reduce inflammation. If symptoms persist for greater than 6 months despite non-surgical (conservative), then surgery may be recommended.   MEDICATION  If pain medication is necessary, then nonsteroidal anti-inflammatory medications, such as aspirin and ibuprofen, or other minor pain relievers, such as acetaminophen, are often recommended. Do not take pain medication within 7 days before surgery. Prescription pain relievers may be given if deemed necessary by your caregiver. Use only as directed and only as much as you need. Corticosteroid injections may be given by your caregiver. These injections should be reserved for the most serious cases, because they may only be given a certain number of times.  HEAT AND COLD Cold treatment (icing) relieves pain and reduces inflammation. Cold treatment should be applied for 10 to 15 minutes every 2 to 3 hours for inflammation and pain and immediately after any activity that aggravates your symptoms. Use ice packs or massage the area with a piece of ice (ice massage). Heat treatment may be used prior to performing the stretching and strengthening activities prescribed by your caregiver, physical therapist, or athletic trainer. Use a heat pack or soak the injury in warm water.  SEEK IMMEDIATE MEDICAL CARE IF: Treatment seems to offer no benefit, or the  condition worsens. Any medications produce  adverse side effects.  EXERCISES- RANGE OF MOTION (ROM) AND STRETCHING EXERCISES - Plantar Fasciitis (Heel Spur Syndrome) These exercises may help you when beginning to rehabilitate your injury. Your symptoms may resolve with or without further involvement from your physician, physical therapist or athletic trainer. While completing these exercises, remember:  Restoring tissue flexibility helps normal motion to return to the joints. This allows healthier, less painful movement and activity. An effective stretch should be held for at least 30 seconds. A stretch should never be painful. You should only feel a gentle lengthening or release in the stretched tissue.  RANGE OF MOTION - Toe Extension, Flexion Sit with your right / left leg crossed over your opposite knee. Grasp your toes and gently pull them back toward the top of your foot. You should feel a stretch on the bottom of your toes and/or foot. Hold this stretch for 10 seconds. Now, gently pull your toes toward the bottom of your foot. You should feel a stretch on the top of your toes and or foot. Hold this stretch for 10 seconds. Repeat  times. Complete this stretch 3 times per day.   RANGE OF MOTION - Ankle Dorsiflexion, Active Assisted Remove shoes and sit on a chair that is preferably not on a carpeted surface. Place right / left foot under knee. Extend your opposite leg for support. Keeping your heel down, slide your right / left foot back toward the chair until you feel a stretch at your ankle or calf. If you do not feel a stretch, slide your bottom forward to the edge of the chair, while still keeping your heel down. Hold this stretch for 10 seconds. Repeat 3 times. Complete this stretch 2 times per day.   STRETCH  Gastroc, Standing Place hands on wall. Extend right / left leg, keeping the front knee somewhat bent. Slightly point your toes inward on your back foot. Keeping your right / left heel on the floor and your knee  straight, shift your weight toward the wall, not allowing your back to arch. You should feel a gentle stretch in the right / left calf. Hold this position for 10 seconds. Repeat 3 times. Complete this stretch 2 times per day.  STRETCH  Soleus, Standing Place hands on wall. Extend right / left leg, keeping the other knee somewhat bent. Slightly point your toes inward on your back foot. Keep your right / left heel on the floor, bend your back knee, and slightly shift your weight over the back leg so that you feel a gentle stretch deep in your back calf. Hold this position for 10 seconds. Repeat 3 times. Complete this stretch 2 times per day.  STRETCH  Gastrocsoleus, Standing  Note: This exercise can place a lot of stress on your foot and ankle. Please complete this exercise only if specifically instructed by your caregiver.  Place the ball of your right / left foot on a step, keeping your other foot firmly on the same step. Hold on to the wall or a rail for balance. Slowly lift your other foot, allowing your body weight to press your heel down over the edge of the step. You should feel a stretch in your right / left calf. Hold this position for 10 seconds. Repeat this exercise with a slight bend in your right / left knee. Repeat 3 times. Complete this stretch 2 times per day.   STRENGTHENING EXERCISES - Plantar Fasciitis (Heel Spur Syndrome)  These  exercises may help you when beginning to rehabilitate your injury. They may resolve your symptoms with or without further involvement from your physician, physical therapist or athletic trainer. While completing these exercises, remember:  Muscles can gain both the endurance and the strength needed for everyday activities through controlled exercises. Complete these exercises as instructed by your physician, physical therapist or athletic trainer. Progress the resistance and repetitions only as guided.  STRENGTH - Towel Curls Sit in a chair  positioned on a non-carpeted surface. Place your foot on a towel, keeping your heel on the floor. Pull the towel toward your heel by only curling your toes. Keep your heel on the floor. Repeat 3 times. Complete this exercise 2 times per day.  STRENGTH - Ankle Inversion Secure one end of a rubber exercise band/tubing to a fixed object (table, pole). Loop the other end around your foot just before your toes. Place your fists between your knees. This will focus your strengthening at your ankle. Slowly, pull your big toe up and in, making sure the band/tubing is positioned to resist the entire motion. Hold this position for 10 seconds. Have your muscles resist the band/tubing as it slowly pulls your foot back to the starting position. Repeat 3 times. Complete this exercises 2 times per day.  Document Released: 06/08/2005 Document Revised: 08/31/2011 Document Reviewed: 09/20/2008 [facility] Patient Information 2014 [place], [place].

## 2022-03-20 NOTE — Progress Notes (Unsigned)
Subjective: Chief Complaint  Patient presents with   Neuroma    Both feet are painful in midfoot, Right > left   Plantar Fasciitis    The feet hurt around the parameter. Also hammertoes on the left. Points to the medial midfoot.   Objective: AAO x3, NAD DP/PT pulses palpable bilaterally, CRT less than 3 seconds Protective sensation intact with Simms Weinstein monofilament, vibratory sensation intact, Achilles tendon reflex intact No areas of pinpoint bony tenderness or pain with vibratory sensation. MMT 5/5, ROM WNL. No edema, erythema, increase in warmth to bilateral lower extremities.  No open lesions or pre-ulcerative lesions.  No pain with calf compression, swelling, warmth, erythema  Assessment:  Plan: -All treatment options discussed with the patient including all alternatives, risks, complications.  -ettin back injection next wee, will see if it helps.  -Patient encouraged to call the office with any questions, concerns, change in symptoms.

## 2022-03-24 ENCOUNTER — Ambulatory Visit: Payer: Medicare Other | Admitting: Physical Medicine and Rehabilitation

## 2022-03-24 ENCOUNTER — Ambulatory Visit: Payer: Self-pay

## 2022-03-24 VITALS — BP 115/72 | HR 72

## 2022-03-24 DIAGNOSIS — M5416 Radiculopathy, lumbar region: Secondary | ICD-10-CM | POA: Diagnosis not present

## 2022-03-24 MED ORDER — METHYLPREDNISOLONE ACETATE 80 MG/ML IJ SUSP
80.0000 mg | Freq: Once | INTRAMUSCULAR | Status: AC
  Administered 2022-03-24: 80 mg

## 2022-03-24 NOTE — Patient Instructions (Signed)

## 2022-03-24 NOTE — Progress Notes (Signed)
Numeric Pain Rating Scale and Functional Assessment Average Pain 9   In the last MONTH (on 0-10 scale) has pain interfered with the following?  1. General activity like being  able to carry out your everyday physical activities such as walking, climbing stairs, carrying groceries, or moving a chair?  Rating(10+)   +Driver, -BT, -Dye Allergies.

## 2022-03-30 ENCOUNTER — Other Ambulatory Visit: Payer: Self-pay | Admitting: Physical Medicine and Rehabilitation

## 2022-03-30 NOTE — Telephone Encounter (Signed)
PMP was Reviewed.  Dr Dagoberto Ligas note was reviewed.  Tramadol e-scribed to pharmacy.  Placed a call to Ms. Shurtz, regarding the above, she verbalizes understanding.

## 2022-03-31 ENCOUNTER — Ambulatory Visit: Payer: Medicare Other | Attending: Rheumatology | Admitting: Rheumatology

## 2022-03-31 ENCOUNTER — Encounter: Payer: Self-pay | Admitting: Rheumatology

## 2022-03-31 VITALS — BP 132/82 | HR 74 | Resp 17 | Ht 61.5 in | Wt 217.2 lb

## 2022-03-31 DIAGNOSIS — M3219 Other organ or system involvement in systemic lupus erythematosus: Secondary | ICD-10-CM | POA: Diagnosis not present

## 2022-03-31 DIAGNOSIS — R296 Repeated falls: Secondary | ICD-10-CM

## 2022-03-31 DIAGNOSIS — Z79899 Other long term (current) drug therapy: Secondary | ICD-10-CM

## 2022-03-31 DIAGNOSIS — M19042 Primary osteoarthritis, left hand: Secondary | ICD-10-CM

## 2022-03-31 DIAGNOSIS — R760 Raised antibody titer: Secondary | ICD-10-CM

## 2022-03-31 DIAGNOSIS — R21 Rash and other nonspecific skin eruption: Secondary | ICD-10-CM | POA: Diagnosis not present

## 2022-03-31 DIAGNOSIS — M8589 Other specified disorders of bone density and structure, multiple sites: Secondary | ICD-10-CM

## 2022-03-31 DIAGNOSIS — R5383 Other fatigue: Secondary | ICD-10-CM

## 2022-03-31 DIAGNOSIS — F5101 Primary insomnia: Secondary | ICD-10-CM

## 2022-03-31 DIAGNOSIS — M797 Fibromyalgia: Secondary | ICD-10-CM

## 2022-03-31 DIAGNOSIS — K760 Fatty (change of) liver, not elsewhere classified: Secondary | ICD-10-CM

## 2022-03-31 DIAGNOSIS — Z8269 Family history of other diseases of the musculoskeletal system and connective tissue: Secondary | ICD-10-CM

## 2022-03-31 DIAGNOSIS — I10 Essential (primary) hypertension: Secondary | ICD-10-CM

## 2022-03-31 DIAGNOSIS — M5136 Other intervertebral disc degeneration, lumbar region: Secondary | ICD-10-CM

## 2022-03-31 DIAGNOSIS — E669 Obesity, unspecified: Secondary | ICD-10-CM

## 2022-03-31 DIAGNOSIS — K219 Gastro-esophageal reflux disease without esophagitis: Secondary | ICD-10-CM

## 2022-03-31 DIAGNOSIS — M19041 Primary osteoarthritis, right hand: Secondary | ICD-10-CM

## 2022-03-31 DIAGNOSIS — G252 Other specified forms of tremor: Secondary | ICD-10-CM

## 2022-03-31 DIAGNOSIS — E559 Vitamin D deficiency, unspecified: Secondary | ICD-10-CM

## 2022-03-31 DIAGNOSIS — R748 Abnormal levels of other serum enzymes: Secondary | ICD-10-CM

## 2022-03-31 DIAGNOSIS — M5030 Other cervical disc degeneration, unspecified cervical region: Secondary | ICD-10-CM

## 2022-03-31 MED ORDER — HYDROXYCHLOROQUINE SULFATE 200 MG PO TABS: ORAL_TABLET | ORAL | 0 refills | Status: DC

## 2022-03-31 NOTE — Procedures (Signed)
S1 Lumbosacral Transforaminal Epidural Steroid Injection - Sub-Pedicular Approach with Fluoroscopic Guidance   Patient: Terri Hurley      Date of Birth: 30-Dec-1960 MRN: [id number] PCP: [name], MD      Visit Date: 03/24/2022   Universal Protocol:    Date/Time: 10/10/239:39 AM  Consent Given By: the patient  Position:  PRONE  Additional Comments: Vital signs were monitored before and after the procedure. Patient was prepped and draped in the usual sterile fashion. The correct patient, procedure, and site was verified.   Injection Procedure Details:  Procedure Site One Meds Administered:  Meds ordered this encounter  Medications   methylPREDNISolone acetate (DEPO-MEDROL) injection 80 mg    Laterality: Bilateral  Location/Site:  S1 Foramen   Needle size: 22 ga.  Needle type: Spinal  Needle Placement: Transforaminal  Findings:   -Comments: Excellent flow of contrast along the nerve, nerve root and into the epidural space.  Epidurogram: Contrast epidurogram showed no nerve root cut off or restricted flow pattern.  Procedure Details: After squaring off the sacral end-plate to get a true AP view, the C-arm was positioned so that the best possible view of the S1 foramen was visualized. The soft tissues overlying this structure were infiltrated with 2-3 ml. of 1% Lidocaine without Epinephrine.    The spinal needle was inserted toward the target using a "trajectory" view along the fluoroscope beam.  Under AP and lateral visualization, the needle was advanced so it did not puncture dura. Biplanar projections were used to confirm position. Aspiration was confirmed to be negative for CSF and/or blood. A 1-2 ml. volume of Isovue-250 was injected and flow of contrast was noted at each level. Radiographs were obtained for documentation purposes.   After attaining the desired flow of contrast documented above, a 0.5 to 1.0 ml test dose of 0.25% Marcaine was injected into  each respective transforaminal space.  The patient was observed for 90 seconds post injection.  After no sensory deficits were reported, and normal lower extremity motor function was noted,   the above injectate was administered so that equal amounts of the injectate were placed at each foramen (level) into the transforaminal epidural space.   Additional Comments:  The patient tolerated the procedure well Dressing: Band-Aid with 2 x 2 sterile gauze    Post-procedure details: Patient was observed during the procedure. Post-procedure instructions were reviewed.  Patient left the clinic in stable condition.

## 2022-03-31 NOTE — Patient Instructions (Signed)
Standing Labs We placed an order today for your standing lab work.   Please have your standing labs drawn in December   Please have your labs drawn 2 weeks prior to your appointment so that the provider can discuss your lab results at your appointment.  Please note that you may see your imaging and lab results in [facility] before we have reviewed them. We will contact you once all results are reviewed. Please allow our office up to 72 hours to thoroughly review all of the results before contacting the office for clarification of your results.  Lab hours are: Monday through Thursday from 1:30 pm-4:30 pm and Friday from 1:30 pm- 4:00 pm  You may experience shorter wait times on Monday, Thursday or Friday afternoons,.   Effective April 20, 2022, new lab hours will be: Monday through Thursday from 8:00 am -12:30 pm and 1:00 pm-5:00 pm and Friday from 8:00 am-12:00 pm.  Please be advised, all patients with office appointments requiring lab work will take precedent over walk-in lab work.   Labs are drawn by [facility]. Please bring your co-pay at the time of your lab draw.  You may receive a bill from [facility] for your lab work.  Please note if you are on Hydroxychloroquine and and an order has been placed for a Hydroxychloroquine level, you will need to have it drawn 4 hours or more after your last dose.  If you wish to have your labs drawn at another location, please call the office 24 hours in advance so we can fax the orders.  The office is located at [address] [place], [place], [place] [address] No appointment is necessary.    If you have any questions regarding directions or hours of operation,  please call [phone].   As a reminder, please drink plenty of water prior to coming for your lab work. Thanks!   Vaccines You are taking a medication(s) that can suppress your immune system.  The following immunizations are recommended: Flu annually Covid-19  Td/Tdap (tetanus,  diphtheria, pertussis) every 10 years Pneumonia (Prevnar 15 then Pneumovax 23 at least 1 year apart.  Alternatively, can take Prevnar 20 without needing additional dose) Shingrix: 2 doses from 4 weeks to 6 months apart  Please check with your PCP to make sure you are up to date.   If you have signs or symptoms of an infection or start antibiotics: First, call your PCP for workup of your infection. Hold your medication through the infection, until you complete your antibiotics, and until symptoms resolve if you take the following: Injectable medication (Actemra, Benlysta, Cimzia, Cosentyx, Enbrel, Humira, Kevzara, Orencia, Remicade, Simponi, Stelara, Taltz, Tremfya) Methotrexate Leflunomide (Arava) Mycophenolate (Cellcept) Morrie Sheldon, Olumiant, or Rinvoq

## 2022-03-31 NOTE — Progress Notes (Signed)
Terri Hurley - 61 y.o. female MRN [id number]  Date of birth: 1960-07-04  Office Visit Note: Visit Date: 03/24/2022 PCP: [name], MD Referred by: [name], MD  Subjective: Chief Complaint  Patient presents with   Lower Back - Pain   HPI:  Terri Hurley is a 61 y.o. female who comes in today for planned repeat Bilateral S1-2  Lumbar Transforaminal epidural steroid injection with fluoroscopic guidance.  The patient has failed conservative care including home exercise, medications, time and activity modification.  This injection will be diagnostic and hopefully therapeutic.  Please see requesting physician notes for further details and justification. Patient received more than 50% pain relief from prior injection.   Referring: Barnet Pall, FNP   ROS Otherwise per HPI.  Assessment & Plan: Visit Diagnoses:    ICD-10-CM   1. Lumbar radiculopathy  M54.16 XR C-ARM NO REPORT    methylPREDNISolone acetate (DEPO-MEDROL) injection 80 mg    Epidural Steroid injection      Plan: No additional findings.   Meds & Orders:  Meds ordered this encounter  Medications   methylPREDNISolone acetate (DEPO-MEDROL) injection 80 mg    Orders Placed This Encounter  Procedures   XR C-ARM NO REPORT   Epidural Steroid injection    Follow-up: Return if symptoms worsen or fail to improve.   Procedures: No procedures performed  S1 Lumbosacral Transforaminal Epidural Steroid Injection - Sub-Pedicular Approach with Fluoroscopic Guidance   Patient: Terri Hurley      Date of Birth: 06/03/1961 MRN: [id number] PCP: [name], MD      Visit Date: 03/24/2022   Universal Protocol:    Date/Time: 10/10/239:39 AM  Consent Given By: the patient  Position:  PRONE  Additional Comments: Vital signs were monitored before and after the procedure. Patient was prepped and draped in the usual sterile fashion. The correct patient, procedure, and site was verified.   Injection  Procedure Details:  Procedure Site One Meds Administered:  Meds ordered this encounter  Medications   methylPREDNISolone acetate (DEPO-MEDROL) injection 80 mg    Laterality: Bilateral  Location/Site:  S1 Foramen   Needle size: 22 ga.  Needle type: Spinal  Needle Placement: Transforaminal  Findings:   -Comments: Excellent flow of contrast along the nerve, nerve root and into the epidural space.  Epidurogram: Contrast epidurogram showed no nerve root cut off or restricted flow pattern.  Procedure Details: After squaring off the sacral end-plate to get a true AP view, the C-arm was positioned so that the best possible view of the S1 foramen was visualized. The soft tissues overlying this structure were infiltrated with 2-3 ml. of 1% Lidocaine without Epinephrine.    The spinal needle was inserted toward the target using a "trajectory" view along the fluoroscope beam.  Under AP and lateral visualization, the needle was advanced so it did not puncture dura. Biplanar projections were used to confirm position. Aspiration was confirmed to be negative for CSF and/or blood. A 1-2 ml. volume of Isovue-250 was injected and flow of contrast was noted at each level. Radiographs were obtained for documentation purposes.   After attaining the desired flow of contrast documented above, a 0.5 to 1.0 ml test dose of 0.25% Marcaine was injected into each respective transforaminal space.  The patient was observed for 90 seconds post injection.  After no sensory deficits were reported, and normal lower extremity motor function was noted,   the above injectate was administered so that equal amounts of the injectate  were placed at each foramen (level) into the transforaminal epidural space.   Additional Comments:  The patient tolerated the procedure well Dressing: Band-Aid with 2 x 2 sterile gauze    Post-procedure details: Patient was observed during the procedure. Post-procedure instructions were  reviewed.  Patient left the clinic in stable condition.   Clinical History: No specialty comments available.     Objective:  VS:  HT:    WT:   BMI:     BP:115/72  HR:72bpm  TEMP: ( )  RESP:96 % Physical Exam Vitals and nursing note reviewed.  Constitutional:      General: She is not in acute distress.    Appearance: Normal appearance. She is not ill-appearing.  HENT:     Head: Normocephalic and atraumatic.     Right Ear: External ear normal.     Left Ear: External ear normal.  Eyes:     Extraocular Movements: Extraocular movements intact.  Cardiovascular:     Rate and Rhythm: Normal rate.     Pulses: Normal pulses.  Pulmonary:     Effort: Pulmonary effort is normal. No respiratory distress.  Abdominal:     General: There is no distension.     Palpations: Abdomen is soft.  Musculoskeletal:        General: Tenderness present.     Cervical back: Neck supple.     Right lower leg: No edema.     Left lower leg: No edema.     Comments: Patient has good distal strength with no pain over the greater trochanters.  No clonus or focal weakness.  Skin:    Findings: No erythema, lesion or rash.  Neurological:     General: No focal deficit present.     Mental Status: She is alert and oriented to person, place, and time.     Sensory: No sensory deficit.     Motor: No weakness or abnormal muscle tone.     Coordination: Coordination normal.  Psychiatric:        Mood and Affect: Mood normal.        Behavior: Behavior normal.      Imaging: No results found.

## 2022-04-01 DIAGNOSIS — D225 Melanocytic nevi of trunk: Secondary | ICD-10-CM | POA: Diagnosis not present

## 2022-04-01 DIAGNOSIS — Z1283 Encounter for screening for malignant neoplasm of skin: Secondary | ICD-10-CM | POA: Diagnosis not present

## 2022-04-01 DIAGNOSIS — D485 Neoplasm of uncertain behavior of skin: Secondary | ICD-10-CM | POA: Diagnosis not present

## 2022-04-02 DIAGNOSIS — F4323 Adjustment disorder with mixed anxiety and depressed mood: Secondary | ICD-10-CM | POA: Diagnosis not present

## 2022-04-07 LAB — HYDROXYCHLOROQUINE,BLOOD: HYDROXYCHLOROQUINE, (B): 840 ng/mL — ABNORMAL HIGH

## 2022-04-08 NOTE — Progress Notes (Signed)
Hydroxychloroquine level is in the desirable range.

## 2022-04-29 DIAGNOSIS — F4323 Adjustment disorder with mixed anxiety and depressed mood: Secondary | ICD-10-CM | POA: Diagnosis not present

## 2022-05-03 ENCOUNTER — Encounter: Payer: Self-pay | Admitting: Physical Medicine and Rehabilitation

## 2022-05-04 ENCOUNTER — Encounter
Payer: Medicare Other | Attending: Physical Medicine and Rehabilitation | Admitting: Physical Medicine and Rehabilitation

## 2022-05-04 ENCOUNTER — Encounter: Payer: Self-pay | Admitting: Physical Medicine and Rehabilitation

## 2022-05-04 VITALS — BP 142/77 | HR 82 | Ht 61.0 in | Wt 216.0 lb

## 2022-05-04 DIAGNOSIS — G894 Chronic pain syndrome: Secondary | ICD-10-CM | POA: Insufficient documentation

## 2022-05-04 DIAGNOSIS — Z79891 Long term (current) use of opiate analgesic: Secondary | ICD-10-CM | POA: Diagnosis not present

## 2022-05-04 DIAGNOSIS — Z5181 Encounter for therapeutic drug level monitoring: Secondary | ICD-10-CM | POA: Diagnosis not present

## 2022-05-04 MED ORDER — GABAPENTIN 400 MG PO CAPS: ORAL_CAPSULE | ORAL | 1 refills | Status: DC

## 2022-05-04 MED ORDER — TRAMADOL HCL 50 MG PO TABS: 50.0000 mg | ORAL_TABLET | Freq: Four times a day (QID) | ORAL | 5 refills | Status: DC | PRN

## 2022-05-04 MED ORDER — PREGABALIN 100 MG PO CAPS: 100.0000 mg | ORAL_CAPSULE | Freq: Two times a day (BID) | ORAL | 1 refills | Status: DC

## 2022-05-04 NOTE — Patient Instructions (Signed)
Pt is a 61  yr old with severe depression- doesn't think has bipolar d/o per pt, allodynia, Lupus, fibromyalgia, and myofascial pain syndrome, and piriformis syndrome as well as bladder spasms and L4/5 disc issues with need for epidural - s/p epidural February 2022- Here for f/u on chronic pain -also lupus followed by Rheum   Couldn't afford Butrans patch- or Lidocaine patch- couldn't try because so unaffordable.   2.  A lot of pain at night, so can try to take Tramadol up to 2 pills at a time- will increase tramadol to up to 4 pills/day- max- 120 pills- #5 refills- but try to stick to 3 pills/day unless day really bad.   3. Will con't Gabapentin and Lyrica- will refill at current doses.    4. UDS today per clinic policy   5. F/U in 77month
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
## 2022-05-07 ENCOUNTER — Telehealth: Payer: Self-pay | Admitting: *Deleted

## 2022-05-07 LAB — TOXASSURE SELECT,+ANTIDEPR,UR

## 2022-05-07 NOTE — Telephone Encounter (Signed)
Urine drug screen for this encounter is consistent for prescribed medication 

## 2022-05-21 DIAGNOSIS — E559 Vitamin D deficiency, unspecified: Secondary | ICD-10-CM | POA: Diagnosis not present

## 2022-05-21 DIAGNOSIS — R7303 Prediabetes: Secondary | ICD-10-CM | POA: Diagnosis not present

## 2022-05-21 DIAGNOSIS — I10 Essential (primary) hypertension: Secondary | ICD-10-CM | POA: Diagnosis not present

## 2022-05-21 DIAGNOSIS — E782 Mixed hyperlipidemia: Secondary | ICD-10-CM | POA: Diagnosis not present

## 2022-05-26 DIAGNOSIS — F4323 Adjustment disorder with mixed anxiety and depressed mood: Secondary | ICD-10-CM | POA: Diagnosis not present

## 2022-06-03 DIAGNOSIS — F4323 Adjustment disorder with mixed anxiety and depressed mood: Secondary | ICD-10-CM | POA: Diagnosis not present

## 2022-06-27 ENCOUNTER — Other Ambulatory Visit: Payer: Self-pay | Admitting: Rheumatology

## 2022-06-27 DIAGNOSIS — M3219 Other organ or system involvement in systemic lupus erythematosus: Secondary | ICD-10-CM

## 2022-06-29 ENCOUNTER — Encounter: Payer: Self-pay | Admitting: *Deleted

## 2022-06-29 NOTE — Telephone Encounter (Signed)
Next Visit: 10/02/2022  Last Visit: 03/31/2022  Labs: 12/26/2021 CBC and CMP are stable.   Eye exam: 08/08/2021 WNL    Current Dose per office note 03/31/2022: Plaquenil 200 mg 1 tablet twice daily.   SW:HQPRF systemic lupus erythematosus with other organ involvement   Last Fill: 03/31/2022  Message sent to patient via my chart to advise patient she is due to update labs.   Okay to refill Plaquenil?

## 2022-07-01 ENCOUNTER — Encounter: Payer: Self-pay | Admitting: Physical Medicine and Rehabilitation

## 2022-07-06 ENCOUNTER — Encounter: Payer: Self-pay | Admitting: Physical Medicine and Rehabilitation

## 2022-07-06 ENCOUNTER — Ambulatory Visit (INDEPENDENT_AMBULATORY_CARE_PROVIDER_SITE_OTHER): Payer: PPO | Admitting: Physical Medicine and Rehabilitation

## 2022-07-06 DIAGNOSIS — M5116 Intervertebral disc disorders with radiculopathy, lumbar region: Secondary | ICD-10-CM | POA: Diagnosis not present

## 2022-07-06 DIAGNOSIS — M797 Fibromyalgia: Secondary | ICD-10-CM | POA: Diagnosis not present

## 2022-07-06 DIAGNOSIS — M5416 Radiculopathy, lumbar region: Secondary | ICD-10-CM

## 2022-07-06 DIAGNOSIS — M5442 Lumbago with sciatica, left side: Secondary | ICD-10-CM | POA: Diagnosis not present

## 2022-07-06 DIAGNOSIS — M5441 Lumbago with sciatica, right side: Secondary | ICD-10-CM

## 2022-07-06 DIAGNOSIS — G894 Chronic pain syndrome: Secondary | ICD-10-CM

## 2022-07-06 DIAGNOSIS — G8929 Other chronic pain: Secondary | ICD-10-CM | POA: Diagnosis not present

## 2022-07-06 NOTE — Progress Notes (Signed)
Functional Pain Scale - descriptive words and definitions  Distracting (5)    Aware of pain/able to complete some ADL's but limited by pain/sleep is affected and active distractions are only slightly useful. Moderate range order  Average Pain 10  Today pain is a 5.  Hurts more in the evenings and nights after a full day. Walking, carrying things hurt. Lower back pain with radicular pain to buttocks. L>R

## 2022-07-06 NOTE — Progress Notes (Signed)
Kareemah Grounds [place] - 62 y.o. female MRN [id number]  Date of birth: 02-Mar-1961  Office Visit Note: Visit Date: 07/06/2022 PCP: [name], MD Referred by: [name], MD  Subjective: Chief Complaint  Patient presents with   Lower Back - Pain   HPI: Maddyx Vallie is a 62 y.o. female who comes in today for evaluation of chronic, worsening and severe bilateral lower back pain radiating to buttocks, left greater than right.  Pain ongoing for several years and is exacerbated by movement and activity.  She describes her pain as a sore, aching and burning sensation, currently rates as 8 out of 10. Patient has long standing history of chronic pain and myalgias. She does carry diagnostic of fibromyalgia and lupus. She has tried home exercise regimen, formal physical therapy and numerous medications such as Gabapentin, Lyrica, Tramadol and Lidocaine patches with minimal relief. She is currently being treated for chronic pain by [facility], she is prescribed 180 tablets of 50 mg Tramadol tablets a month. We did refer patient to Dr. Sherley Bounds at [facility] in 2022, he obtained new lumbar MRI imaging at that time that shows multi level facet hypertrophy, regression of L5-S1 disc extrusion, there is small residual sequestered disc fragment that remains in left lateral recess potentially affecting the left S1 nerve root. No high grade spinal canal stenosis.  Dr. Ronnald Ramp did not recommend surgical intervention at that time.  Patient has undergone multiple lumbar epidural steroid injections in our office over the years, most recent was bilateral S1 transforaminal epidural steroid injection on 03/24/2022.  She reports no relief of pain with this procedure.  States minimal short-term relief with previous lumbar injections.  Overall, she feels lumbar injections have not truly been beneficial in alleviating her pain. Lupus is currently managed by Dr. Bo Merino at [facility].  Patient denies focal weakness.  Patient denies recent trauma or falls.    Review of Systems  Musculoskeletal:  Positive for back pain and myalgias.  Neurological:  Positive for tingling. Negative for focal weakness and weakness.  All other systems reviewed and are negative.  Otherwise per HPI.  Assessment & Plan: Visit Diagnoses:    ICD-10-CM   1. Lumbar radiculopathy  M54.16 CANCELED: MR LUMBAR SPINE WO CONTRAST    2. Chronic bilateral low back pain with bilateral sciatica  M54.42 CANCELED: MR LUMBAR SPINE WO CONTRAST   M54.41    G89.29     3. Intervertebral disc disorders with radiculopathy, lumbar region  M51.16 CANCELED: MR LUMBAR SPINE WO CONTRAST    4. Fibromyalgia  M79.7 CANCELED: MR LUMBAR SPINE WO CONTRAST    5. Chronic pain syndrome  G89.4 CANCELED: MR LUMBAR SPINE WO CONTRAST       Plan: Findings:  Chronic, worsening and severe bilateral lower back pain radiating to buttocks, left greater than right.  Patient continues to have severe pain despite good conservative therapies such as formal physical therapy, home exercise regimen, rest and use of medications.  Patient's clinical presentation and exam are complex, she does have longstanding history of chronic pain. I was able to locate more recent lumbar MRI imaging from 2022 that shows regression of disc extrusion at the level of L5-S1. There is sequestered fragment that remains in left lateral recess.  No relief with lumbar epidural steroid injection in October 2023, minimal short term relief of pain with all other previous injections.  At this point, I do not  feel repeating lumbar injections would be beneficial in alleviating her pain.  I do think her fibromyalgia and lupus played a significant role in her chronic pain. I encouraged patient to follow back up with Dr. Dagoberto Ligas as needed. No red flag symptoms noted upon exam today.     Meds & Orders: No orders of the defined types were  placed in this encounter.   No orders of the defined types were placed in this encounter.   Follow-up: Return if symptoms worsen or fail to improve.   Procedures: No procedures performed      Clinical History: EXAM: MRI LUMBAR SPINE WITHOUT CONTRAST  TECHNIQUE: Multiplanar, multisequence MR imaging of the lumbar spine was performed. No intravenous contrast was administered.  COMPARISON: Lumbar spine MRI 03/29/2019  FINDINGS: Segmentation: Standard.  Alignment: Mild lumbar dextroscoliosis. No listhesis.  Vertebrae: No fracture, suspicious marrow lesion, or significant marrow edema.  Conus medullaris and cauda equina: Conus extends to the L1 level. Conus and cauda equina appear normal.  Paraspinal and other soft tissues: Unremarkable.  Disc levels:  Mild disc desiccation and up to mild disc space narrowing throughout the lumbar spine.  L1-2: Mild disc bulging eccentric to the left, endplate spurring, and slight facet hypertrophy result in borderline to mild left neural foraminal stenosis without spinal stenosis, unchanged.  L2-3: Disc bulging eccentric to the right, endplate spurring, and mild facet hypertrophy result in mild right neural foraminal stenosis without spinal stenosis, unchanged.  L3-4: Mild disc bulging, endplate spurring, and mild facet hypertrophy result in borderline left neural foraminal stenosis without spinal stenosis, unchanged.  L4-5: Disc bulging, endplate spurring, and mild facet hypertrophy result in borderline to mild bilateral neural foraminal stenosis without spinal stenosis, unchanged.  L5-S1: The central disc extrusion on the prior study has regressed. There is a shallow residual central disc protrusion with annular fissure in close proximity to the left greater than right S1 nerve roots without evidence of neural compression or significant stenosis. A 6 mm focus of soft tissue in the left lateral recess inferior to the disc space  presumably reflects a residual disc fragment, overall smaller than on the prior study though potentially irritating the left S1 nerve root. Mild facet hypertrophy. Patent neural foramina.  IMPRESSION: 1. Regression of L5-S1 disc extrusion. Persistent small residual sequestered disc fragment in the left lateral recess potentially affecting the left S1 nerve root. 2. Unchanged disc and facet degeneration elsewhere with up to mild neural foraminal stenosis. No spinal stenosis.   Electronically Signed By: Logan Bores M.D. On: 05/15/2021 19:20   She reports that she has never smoked. She has never been exposed to tobacco smoke. She has never used smokeless tobacco. No results for input(s): "HGBA1C", "LABURIC" in the last 8760 hours.  Objective:  VS:  HT:    WT:   BMI:     BP:   HR: bpm  TEMP: ( )  RESP:  Physical Exam Vitals and nursing note reviewed.  HENT:     Head: Normocephalic and atraumatic.     Right Ear: External ear normal.     Left Ear: External ear normal.     Nose: Nose normal.     Mouth/Throat:     Mouth: Mucous membranes are moist.  Eyes:     Extraocular Movements: Extraocular movements intact.  Cardiovascular:     Rate and Rhythm: Normal rate.     Pulses: Normal pulses.  Pulmonary:     Effort: Pulmonary effort is normal.  Abdominal:  General: Abdomen is flat. There is no distension.  Musculoskeletal:        General: Tenderness present.     Cervical back: Normal range of motion.     Comments: Pt rises from seated position to standing without difficulty. Good lumbar range of motion. Strong distal strength without clonus, no pain upon palpation of greater trochanters. Sensation intact bilaterally. Patient is particularly tender to touch. Walks independently, gait steady.   Skin:    General: Skin is warm and dry.     Capillary Refill: Capillary refill takes less than 2 seconds.  Neurological:     General: No focal deficit present.     Mental Status: She  is alert and oriented to person, place, and time.  Psychiatric:        Mood and Affect: Mood normal.        Behavior: Behavior normal.     Ortho Exam  Imaging: No results found.  Past Medical/Family/Surgical/Social History: Medications & Allergies reviewed per EMR, new medications updated. Patient Active Problem List   Diagnosis Date Noted   Hx of adenomatous colonic polyps 08/12/2021   Nerve pain 03/19/2021   Antiphospholipid antibody positive 02/26/2021   Unsteady gait 03/04/2020   Vertigo 03/04/2020   Severe episode of recurrent major depressive disorder, without psychotic features ([place]) 01/17/2020   Chronic pain syndrome 09/27/2019   Encounter for therapeutic drug monitoring 09/27/2019   Encounter for monitoring opioid maintenance therapy 09/27/2019   Bladder spasms 06/21/2019   Protrusion of lumbar intervertebral disc 05/16/2019   Allodynia 03/23/2019   Fibromyalgia 03/23/2019   Myofascial pain dysfunction syndrome 03/23/2019   SLE (systemic lupus erythematosus) ([place])    Primary osteoarthritis of both hands 10/01/2017   DDD (degenerative disc disease), cervical 08/23/2017   DDD (degenerative disc disease), lumbar 08/23/2017   Essential hypertension 01/28/2016   Incontinence of urine 12/26/2010   Depression    GERD (gastroesophageal reflux disease)    Fatty liver disease, nonalcoholic    HNP (herniated nucleus pulposus), cervical    Recurrent genital HSV (herpes simplex virus) infection    Plantar fasciitis    Elevated liver enzymes 12/12/2010   Vitamin D deficiency 12/12/2010   Past Medical History:  Diagnosis Date   Arthritis    low back, hip, hands   Depression    Sees [name], NP @ [facility]  counseling center.Marland Kitchen Has h/o hospitalization   Fatty liver disease, nonalcoholic    clinical diagnosis by endocrinologist   Fibromyalgia    GERD (gastroesophageal reflux disease)    controlled with PPI therapy   Hepatitis unknown type    High cholesterol     No medical therapy. Last lab March '12  LDL 91, T. Chol 170. Minimal elevation in '08   HNP (herniated nucleus pulposus), cervical    C6-7. Has had PT, no surgery   Hypertension    Iron deficiency anemia    per patient   Plantar fasciitis    Recurrent genital HSV (herpes simplex virus) infection    on daily suppression with acyclovir   SLE (systemic lupus erythematosus) (HCC)    sees rheum, on plaquenil   Urinary incontinence    Vitamin D deficiency    lab '09  Vit D = 19   Family History  Problem Relation Age of Onset   Heart disease Mother 93       AMI   Diabetes Mother        peripheral neuropathy   Hypertension Mother    Hyperlipidemia  Mother    Mental illness Mother        depression and anxiety   Gout Mother    Deep vein thrombosis Mother    Diabetes Father    Heart disease Father    Hyperlipidemia Father    Mental illness Father        depression and anxiety   Cancer Sister 16       breast cancer   Mental illness Sister    Asthma Sister    Thyroid disease Sister    COPD Sister    Mental illness Sister    Mental illness Sister    Heart disease Brother        arrythmia, [place]   Hyperlipidemia Brother    Mental illness Brother    COPD Brother    Stroke Brother    Hyperlipidemia Brother    Hypertension Brother    Mental illness Brother    Mental illness Brother    Mental illness Brother    GER disease Son    Breast cancer Neg Hx    Past Surgical History:  Procedure Laterality Date   BIOPSY  08/12/2021   Procedure: BIOPSY;  Surgeon: Wilford Corner, MD;  [facility];  Service: Endoscopy;;   CHOLECYSTECTOMY     COLONOSCOPY WITH PROPOFOL N/A 08/12/2021   Procedure: COLONOSCOPY WITH PROPOFOL;  Surgeon: Wilford Corner, MD;  Location: [facility];  Service: Endoscopy;  Laterality: N/A;   INCONTINENCE SURGERY  2009   POLYPECTOMY  08/12/2021   Procedure: POLYPECTOMY;  Surgeon: Wilford Corner, MD;  Location: [facility];  Service:  Endoscopy;;   SPINE SURGERY  05/2014   Cervical spine surgery C4-5, C5-6.  [place].   TOTAL ABDOMINAL HYSTERECTOMY W/ BILATERAL SALPINGOOPHORECTOMY  2009   with repair of cystocele and rectocele    Social History   Occupational History   Occupation: HAIR DRESSER  Tobacco Use   Smoking status: Never    Passive exposure: Never   Smokeless tobacco: Never  Vaping Use   Vaping Use: Never used  Substance and Sexual Activity   Alcohol use: No   Drug use: No   Sexual activity: Never    Birth control/protection: Surgical, Post-menopausal

## 2022-07-08 DIAGNOSIS — F4323 Adjustment disorder with mixed anxiety and depressed mood: Secondary | ICD-10-CM | POA: Diagnosis not present

## 2022-07-16 DIAGNOSIS — D849 Immunodeficiency, unspecified: Secondary | ICD-10-CM | POA: Diagnosis not present

## 2022-07-16 DIAGNOSIS — N898 Other specified noninflammatory disorders of vagina: Secondary | ICD-10-CM | POA: Diagnosis not present

## 2022-07-16 DIAGNOSIS — A6000 Herpesviral infection of urogenital system, unspecified: Secondary | ICD-10-CM | POA: Diagnosis not present

## 2022-07-16 DIAGNOSIS — M329 Systemic lupus erythematosus, unspecified: Secondary | ICD-10-CM | POA: Diagnosis not present

## 2022-07-29 DIAGNOSIS — F4323 Adjustment disorder with mixed anxiety and depressed mood: Secondary | ICD-10-CM | POA: Diagnosis not present

## 2022-08-03 ENCOUNTER — Encounter: Payer: HMO | Attending: Physical Medicine and Rehabilitation | Admitting: Physical Medicine and Rehabilitation

## 2022-08-03 ENCOUNTER — Encounter: Payer: Self-pay | Admitting: Physical Medicine and Rehabilitation

## 2022-08-03 VITALS — BP 116/71 | HR 77 | Ht 61.0 in | Wt 213.4 lb

## 2022-08-03 DIAGNOSIS — M5126 Other intervertebral disc displacement, lumbar region: Secondary | ICD-10-CM | POA: Diagnosis not present

## 2022-08-03 DIAGNOSIS — M797 Fibromyalgia: Secondary | ICD-10-CM | POA: Diagnosis not present

## 2022-08-03 DIAGNOSIS — M5136 Other intervertebral disc degeneration, lumbar region: Secondary | ICD-10-CM | POA: Diagnosis not present

## 2022-08-03 DIAGNOSIS — F329 Major depressive disorder, single episode, unspecified: Secondary | ICD-10-CM | POA: Diagnosis not present

## 2022-08-03 DIAGNOSIS — R2681 Unsteadiness on feet: Secondary | ICD-10-CM | POA: Insufficient documentation

## 2022-08-03 MED ORDER — TETRABENAZINE 12.5 MG PO TABS: 12.5000 mg | ORAL_TABLET | Freq: Every day | ORAL | 5 refills | Status: DC

## 2022-08-03 MED ORDER — ACETAMINOPHEN-CODEINE 300-30 MG PO TABS: 1.0000 | ORAL_TABLET | Freq: Four times a day (QID) | ORAL | 3 refills | Status: DC | PRN
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
## 2022-08-03 NOTE — Patient Instructions (Signed)
Pt is a 62 yr old with severe depression- doesn't think has bipolar d/o per pt, allodynia, Lupus, fibromyalgia, and myofascial pain syndrome, and piriformis syndrome as well as bladder spasms and L4/5 disc issues with need for epidural - s/p epidural February 2022-Also has Lupus followed by Rheum.  Here for f/u on chronic pain -   The shaking getting worse- needs to speak with Psychiatry NP. 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      2.  Only one FDA approved for Tardive dyskinesia is Tetrabenazine- 12.5 mg daily- will try for shaking.   3. Has allergy to Duloxetine; already on Lyrica and Gabapentin- cannot increase per her pharmacy. Both also cause weight gain.   4. Went over MRI results that show still has DDD lumbar and pain worse down LLE so makes sense that lumbar radiculopathy is cause of pain     5. Tylenol #3-  can take up 4x/day-as needed take in between tramadol-  - codeine can cause nausea- take with food. #120 with 3 refills  6. F/U in 3 months- call me 1 month to let me know how its going.

## 2022-08-11 DIAGNOSIS — F4323 Adjustment disorder with mixed anxiety and depressed mood: Secondary | ICD-10-CM | POA: Diagnosis not present

## 2022-08-12 DIAGNOSIS — H2513 Age-related nuclear cataract, bilateral: Secondary | ICD-10-CM | POA: Diagnosis not present

## 2022-08-12 DIAGNOSIS — H02834 Dermatochalasis of left upper eyelid: Secondary | ICD-10-CM | POA: Diagnosis not present

## 2022-08-12 DIAGNOSIS — L932 Other local lupus erythematosus: Secondary | ICD-10-CM | POA: Diagnosis not present

## 2022-08-12 DIAGNOSIS — H40033 Anatomical narrow angle, bilateral: Secondary | ICD-10-CM | POA: Diagnosis not present

## 2022-08-12 DIAGNOSIS — Z79899 Other long term (current) drug therapy: Secondary | ICD-10-CM | POA: Diagnosis not present

## 2022-08-12 DIAGNOSIS — H02831 Dermatochalasis of right upper eyelid: Secondary | ICD-10-CM | POA: Diagnosis not present

## 2022-09-04 ENCOUNTER — Encounter: Payer: Self-pay | Admitting: *Deleted

## 2022-09-04 ENCOUNTER — Other Ambulatory Visit: Payer: Self-pay | Admitting: *Deleted

## 2022-09-04 NOTE — Telephone Encounter (Signed)
Error
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
## 2022-09-23 DIAGNOSIS — F4323 Adjustment disorder with mixed anxiety and depressed mood: Secondary | ICD-10-CM | POA: Diagnosis not present

## 2022-09-29 ENCOUNTER — Other Ambulatory Visit: Payer: Self-pay | Admitting: *Deleted

## 2022-09-29 DIAGNOSIS — R760 Raised antibody titer: Secondary | ICD-10-CM

## 2022-09-29 DIAGNOSIS — M3219 Other organ or system involvement in systemic lupus erythematosus: Secondary | ICD-10-CM

## 2022-09-29 DIAGNOSIS — F4323 Adjustment disorder with mixed anxiety and depressed mood: Secondary | ICD-10-CM | POA: Diagnosis not present

## 2022-10-02 ENCOUNTER — Encounter: Payer: Self-pay | Admitting: Rheumatology

## 2022-10-02 ENCOUNTER — Ambulatory Visit: Payer: HMO | Attending: Rheumatology | Admitting: Rheumatology

## 2022-10-02 VITALS — BP 123/80 | HR 73 | Resp 18 | Ht 61.5 in | Wt 206.6 lb

## 2022-10-02 DIAGNOSIS — K760 Fatty (change of) liver, not elsewhere classified: Secondary | ICD-10-CM

## 2022-10-02 DIAGNOSIS — M797 Fibromyalgia: Secondary | ICD-10-CM | POA: Diagnosis not present

## 2022-10-02 DIAGNOSIS — M5136 Other intervertebral disc degeneration, lumbar region: Secondary | ICD-10-CM | POA: Diagnosis not present

## 2022-10-02 DIAGNOSIS — R21 Rash and other nonspecific skin eruption: Secondary | ICD-10-CM | POA: Diagnosis not present

## 2022-10-02 DIAGNOSIS — R748 Abnormal levels of other serum enzymes: Secondary | ICD-10-CM

## 2022-10-02 DIAGNOSIS — R760 Raised antibody titer: Secondary | ICD-10-CM

## 2022-10-02 DIAGNOSIS — E559 Vitamin D deficiency, unspecified: Secondary | ICD-10-CM | POA: Diagnosis not present

## 2022-10-02 DIAGNOSIS — M19041 Primary osteoarthritis, right hand: Secondary | ICD-10-CM

## 2022-10-02 DIAGNOSIS — M3219 Other organ or system involvement in systemic lupus erythematosus: Secondary | ICD-10-CM

## 2022-10-02 DIAGNOSIS — R5383 Other fatigue: Secondary | ICD-10-CM | POA: Diagnosis not present

## 2022-10-02 DIAGNOSIS — G252 Other specified forms of tremor: Secondary | ICD-10-CM

## 2022-10-02 DIAGNOSIS — Z79899 Other long term (current) drug therapy: Secondary | ICD-10-CM

## 2022-10-02 DIAGNOSIS — K219 Gastro-esophageal reflux disease without esophagitis: Secondary | ICD-10-CM

## 2022-10-02 DIAGNOSIS — F5101 Primary insomnia: Secondary | ICD-10-CM

## 2022-10-02 DIAGNOSIS — M8589 Other specified disorders of bone density and structure, multiple sites: Secondary | ICD-10-CM | POA: Diagnosis not present

## 2022-10-02 DIAGNOSIS — Z8269 Family history of other diseases of the musculoskeletal system and connective tissue: Secondary | ICD-10-CM

## 2022-10-02 DIAGNOSIS — I10 Essential (primary) hypertension: Secondary | ICD-10-CM

## 2022-10-02 DIAGNOSIS — M5030 Other cervical disc degeneration, unspecified cervical region: Secondary | ICD-10-CM

## 2022-10-02 DIAGNOSIS — R296 Repeated falls: Secondary | ICD-10-CM

## 2022-10-02 DIAGNOSIS — M19042 Primary osteoarthritis, left hand: Secondary | ICD-10-CM

## 2022-10-02 MED ORDER — HYDROXYCHLOROQUINE SULFATE 200 MG PO TABS: ORAL_TABLET | ORAL | 0 refills | Status: DC

## 2022-10-02 NOTE — Patient Instructions (Signed)
Please take Plaquenil 200 mg 1 tablet by mouth twice a day on a regular basis.  Please get eye examination once a year to screen for toxicity.  Please use sunscreen on a regular basis. Vaccines You are taking a medication(s) that can suppress your immune system.  The following immunizations are recommended: Flu annually Covid-19  Td/Tdap (tetanus, diphtheria, pertussis) every 10 years Pneumonia (Prevnar 15 then Pneumovax 23 at least 1 year apart.  Alternatively, can take Prevnar 20 without needing additional dose) Shingrix: 2 doses from 4 weeks to 6 months apart  Please check with your PCP to make sure you are up to date.   Heart Disease Prevention   Your inflammatory disease increases your risk of heart disease which includes heart attack, stroke, atrial fibrillation (irregular heartbeats), high blood pressure, heart failure and atherosclerosis (plaque in the arteries).  It is important to reduce your risk by:   Keep blood pressure, cholesterol, and blood sugar at healthy levels   Smoking Cessation   Maintain a healthy weight  BMI 20-25   Eat a healthy diet  Plenty of fresh fruit, vegetables, and whole grains  Limit saturated fats, foods high in sodium, and added sugars  DASH and Mediterranean diet   Increase physical activity  Recommend moderate physically activity for 150 minutes per week/ 30 minutes a day for five days a week These can be broken up into three separate ten-minute sessions during the day.   Reduce Stress  Meditation, slow breathing exercises, yoga, coloring books  Dental visits twice a year

## 2022-10-05 LAB — CBC WITH DIFFERENTIAL/PLATELET
Absolute Monocytes: 308 cells/uL (ref 200–950)
Basophils Absolute: 31 cells/uL (ref 0–200)
Basophils Relative: 0.8 %
Eosinophils Absolute: 39 cells/uL (ref 15–500)
Eosinophils Relative: 1 %
HCT: 39.9 % (ref 35.0–45.0)
Hemoglobin: 13.3 g/dL (ref 11.7–15.5)
Lymphs Abs: 846 cells/uL — ABNORMAL LOW (ref 850–3900)
MCH: 31.5 pg (ref 27.0–33.0)
MCHC: 33.3 g/dL (ref 32.0–36.0)
MCV: 94.5 fL (ref 80.0–100.0)
MPV: 11.4 fL (ref 7.5–12.5)
Monocytes Relative: 7.9 %
Neutro Abs: 2675 cells/uL (ref 1500–7800)
Neutrophils Relative %: 68.6 %
Platelets: 209 10*3/uL (ref 140–400)
RBC: 4.22 10*6/uL (ref 3.80–5.10)
RDW: 12.5 % (ref 11.0–15.0)
Total Lymphocyte: 21.7 %
WBC: 3.9 10*3/uL (ref 3.8–10.8)

## 2022-10-05 LAB — COMPLETE METABOLIC PANEL WITH GFR
AG Ratio: 1.8 (calc) (ref 1.0–2.5)
ALT: 43 U/L — ABNORMAL HIGH (ref 6–29)
AST: 33 U/L (ref 10–35)
Albumin: 4.2 g/dL (ref 3.6–5.1)
Alkaline phosphatase (APISO): 77 U/L (ref 37–153)
BUN/Creatinine Ratio: 6 (calc) (ref 6–22)
BUN: 7 mg/dL (ref 7–25)
CO2: 26 mmol/L (ref 20–32)
Calcium: 9 mg/dL (ref 8.6–10.4)
Chloride: 99 mmol/L (ref 98–110)
Creat: 1.15 mg/dL — ABNORMAL HIGH (ref 0.50–1.05)
Globulin: 2.4 g/dL (calc) (ref 1.9–3.7)
Glucose, Bld: 114 mg/dL — ABNORMAL HIGH (ref 65–99)
Potassium: 4 mmol/L (ref 3.5–5.3)
Sodium: 135 mmol/L (ref 135–146)
Total Bilirubin: 0.6 mg/dL (ref 0.2–1.2)
Total Protein: 6.6 g/dL (ref 6.1–8.1)
eGFR: 54 mL/min/{1.73_m2} — ABNORMAL LOW (ref >=60)

## 2022-10-05 LAB — ANTI-DNA ANTIBODY, DOUBLE-STRANDED: ds DNA Ab: 2 IU/mL

## 2022-10-05 LAB — C3 AND C4
C3 Complement: 127 mg/dL (ref 83–193)
C4 Complement: 19 mg/dL (ref 15–57)

## 2022-10-05 LAB — PROTEIN / CREATININE RATIO, URINE
Creatinine, Urine: 75 mg/dL (ref 20–275)
Protein/Creat Ratio: 67 mg/g creat (ref 24–184)
Protein/Creatinine Ratio: 0.067 mg/mg creat (ref 0.024–0.184)
Total Protein, Urine: 5 mg/dL (ref 5–24)

## 2022-10-05 LAB — CARDIOLIPIN ANTIBODIES, IGG, IGM, IGA
Anticardiolipin IgA: 29.3 APL-U/mL — ABNORMAL HIGH (ref <20.0)
Anticardiolipin IgG: 12.8 GPL-U/mL (ref <20.0)
Anticardiolipin IgM: 37.9 MPL-U/mL — ABNORMAL HIGH (ref <20.0)

## 2022-10-05 LAB — BETA-2 GLYCOPROTEIN ANTIBODIES
Beta-2 Glyco 1 IgA: 27.2 U/mL — ABNORMAL HIGH (ref <20.0)
Beta-2 Glyco 1 IgM: 38.3 U/mL — ABNORMAL HIGH (ref <20.0)
Beta-2 Glyco I IgG: 40.4 U/mL — ABNORMAL HIGH (ref <20.0)

## 2022-10-05 LAB — SEDIMENTATION RATE: Sed Rate: 9 mm/h (ref 0–30)

## 2022-10-05 NOTE — Progress Notes (Signed)
Complements are normal, sed rate is normal, double-stranded DNA negative, beta-2 GP 1 antibodies are still positive and stable.  Anticardiolipin antibodies are positive and stable.  CBC is stable.  CMP showed mildly elevated glucose probably not a fasting sample.  Creatinine is elevated.  Please advise patient to increase water intake.  Liver functions are mildly elevated and stable.  Patient should avoid all NSAIDs.  No change in treatment advised.

## 2022-10-13 DIAGNOSIS — R070 Pain in throat: Secondary | ICD-10-CM | POA: Diagnosis not present

## 2022-10-13 DIAGNOSIS — H1089 Other conjunctivitis: Secondary | ICD-10-CM | POA: Diagnosis not present

## 2022-10-13 DIAGNOSIS — J3489 Other specified disorders of nose and nasal sinuses: Secondary | ICD-10-CM | POA: Diagnosis not present

## 2022-10-13 DIAGNOSIS — R490 Dysphonia: Secondary | ICD-10-CM | POA: Diagnosis not present

## 2022-10-13 DIAGNOSIS — R519 Headache, unspecified: Secondary | ICD-10-CM | POA: Diagnosis not present

## 2022-10-13 DIAGNOSIS — R0982 Postnasal drip: Secondary | ICD-10-CM | POA: Diagnosis not present

## 2022-10-30 ENCOUNTER — Ambulatory Visit: Payer: HMO | Admitting: Physical Medicine and Rehabilitation

## 2022-11-11 DIAGNOSIS — F4323 Adjustment disorder with mixed anxiety and depressed mood: Secondary | ICD-10-CM | POA: Diagnosis not present

## 2022-11-13 ENCOUNTER — Encounter: Payer: HMO | Attending: Physical Medicine and Rehabilitation | Admitting: Physical Medicine and Rehabilitation

## 2022-11-13 ENCOUNTER — Encounter: Payer: Self-pay | Admitting: Physical Medicine and Rehabilitation

## 2022-11-13 VITALS — BP 114/70 | HR 76 | Ht 61.5 in | Wt 201.4 lb

## 2022-11-13 DIAGNOSIS — M797 Fibromyalgia: Secondary | ICD-10-CM

## 2022-11-13 DIAGNOSIS — M5020 Other cervical disc displacement, unspecified cervical region: Secondary | ICD-10-CM | POA: Diagnosis not present

## 2022-11-13 DIAGNOSIS — M7918 Myalgia, other site: Secondary | ICD-10-CM

## 2022-11-13 DIAGNOSIS — R2681 Unsteadiness on feet: Secondary | ICD-10-CM

## 2022-11-13 DIAGNOSIS — M5136 Other intervertebral disc degeneration, lumbar region: Secondary | ICD-10-CM | POA: Diagnosis not present

## 2022-11-13 DIAGNOSIS — M51369 Other intervertebral disc degeneration, lumbar region without mention of lumbar back pain or lower extremity pain: Secondary | ICD-10-CM

## 2022-11-13 MED ORDER — PREGABALIN 100 MG PO CAPS: 100.0000 mg | ORAL_CAPSULE | Freq: Two times a day (BID) | ORAL | 1 refills | Status: DC

## 2022-11-13 MED ORDER — TRAMADOL HCL 50 MG PO TABS: 50.0000 mg | ORAL_TABLET | Freq: Four times a day (QID) | ORAL | 5 refills | Status: DC | PRN

## 2022-11-13 MED ORDER — ACETAMINOPHEN-CODEINE 300-30 MG PO TABS: 1.0000 | ORAL_TABLET | Freq: Four times a day (QID) | ORAL | 3 refills | Status: DC | PRN

## 2022-11-13 MED ORDER — GABAPENTIN 400 MG PO CAPS: ORAL_CAPSULE | ORAL | 1 refills | Status: DC
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
## 2022-11-13 NOTE — Patient Instructions (Signed)
Pt is a 62 yr old with severe depression- doesn't think has bipolar d/o per pt, allodynia, Lupus, fibromyalgia, and myofascial pain syndrome, and piriformis syndrome as well as bladder spasms and L4/5 disc issues with need for epidural - s/p epidural February 2022-Also has Lupus followed by Rheum.  Here for f/u on chronic pain -    I think you can try to split Tylenol #3 in half and take during day- and if that doesn't make her sleepy- try on day not leaving the house and see if makes sleepy; if it doesn't then can try it during day.   2. Con't Tramadol and Tylenol #3- doesn't need refills today- has 2 refills on Tylenol #3 and 1 more of tramadol.    3. Not due for UDS today- will con't per clinic policy.   4. Con't to use tennis ball- use at least 3 days/week and can also use theracane with balls on it.   5. Body tries to get real tight around areas of arthritis to protect it- so that's why muscle spasms keep coming back.    6. Refill Lyrica 100 mg TID and Gabapentin 400 mg TID- for nerve pain  7. F/U in 3months- alternate with myself and Riley Lam-

## 2022-11-18 DIAGNOSIS — F4323 Adjustment disorder with mixed anxiety and depressed mood: Secondary | ICD-10-CM | POA: Diagnosis not present

## 2022-12-27 ENCOUNTER — Other Ambulatory Visit: Payer: Self-pay | Admitting: Rheumatology

## 2022-12-27 DIAGNOSIS — M3219 Other organ or system involvement in systemic lupus erythematosus: Secondary | ICD-10-CM

## 2022-12-28 DIAGNOSIS — F4323 Adjustment disorder with mixed anxiety and depressed mood: Secondary | ICD-10-CM | POA: Diagnosis not present

## 2022-12-28 NOTE — Telephone Encounter (Signed)
Last Fill: 10/02/2022  Eye exam: 08/12/2022   Labs: 09/29/2022 Complements are normal, sed rate is normal, double-stranded DNA negative, beta-2 GP 1 antibodies are still positive and stable.  Anticardiolipin antibodies are positive and stable.  CBC is stable.  CMP showed mildly elevated glucose probably not a fasting sample.  Creatinine is elevated.  Please advise patient to increase water intake.  Liver functions are mildly elevated and stable.  Patient should avoid all NSAIDs.  No change in treatment advised.   Next Visit: 01/11/2023  Last Visit: 10/02/2022  DX:Other systemic lupus erythematosus with other organ involvement   Current Dose per office note on 10/02/2022: Plaquenil 200 mg 1 tablet twice daily.   Okay to refill Plaquenil?
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
## 2023-01-04 DIAGNOSIS — M797 Fibromyalgia: Secondary | ICD-10-CM | POA: Diagnosis not present

## 2023-01-04 DIAGNOSIS — L309 Dermatitis, unspecified: Secondary | ICD-10-CM | POA: Diagnosis not present

## 2023-01-04 DIAGNOSIS — Z1159 Encounter for screening for other viral diseases: Secondary | ICD-10-CM | POA: Diagnosis not present

## 2023-01-04 DIAGNOSIS — D6862 Lupus anticoagulant syndrome: Secondary | ICD-10-CM | POA: Diagnosis not present

## 2023-01-04 DIAGNOSIS — F331 Major depressive disorder, recurrent, moderate: Secondary | ICD-10-CM | POA: Diagnosis not present

## 2023-01-04 DIAGNOSIS — E559 Vitamin D deficiency, unspecified: Secondary | ICD-10-CM | POA: Diagnosis not present

## 2023-01-04 DIAGNOSIS — I10 Essential (primary) hypertension: Secondary | ICD-10-CM | POA: Diagnosis not present

## 2023-01-04 DIAGNOSIS — R7303 Prediabetes: Secondary | ICD-10-CM | POA: Diagnosis not present

## 2023-01-04 DIAGNOSIS — R6889 Other general symptoms and signs: Secondary | ICD-10-CM | POA: Diagnosis not present

## 2023-01-04 DIAGNOSIS — R102 Pelvic and perineal pain: Secondary | ICD-10-CM | POA: Diagnosis not present

## 2023-01-06 DIAGNOSIS — F4323 Adjustment disorder with mixed anxiety and depressed mood: Secondary | ICD-10-CM | POA: Diagnosis not present

## 2023-01-07 DIAGNOSIS — M8588 Other specified disorders of bone density and structure, other site: Secondary | ICD-10-CM | POA: Diagnosis not present

## 2023-01-07 DIAGNOSIS — M329 Systemic lupus erythematosus, unspecified: Secondary | ICD-10-CM | POA: Diagnosis not present

## 2023-01-11 ENCOUNTER — Ambulatory Visit: Payer: HMO | Attending: Rheumatology | Admitting: Rheumatology

## 2023-01-11 ENCOUNTER — Encounter: Payer: Self-pay | Admitting: Rheumatology

## 2023-01-11 VITALS — BP 132/79 | HR 76 | Resp 16 | Ht 61.0 in | Wt 197.2 lb

## 2023-01-11 DIAGNOSIS — F5101 Primary insomnia: Secondary | ICD-10-CM

## 2023-01-11 DIAGNOSIS — M19041 Primary osteoarthritis, right hand: Secondary | ICD-10-CM

## 2023-01-11 DIAGNOSIS — M797 Fibromyalgia: Secondary | ICD-10-CM | POA: Diagnosis not present

## 2023-01-11 DIAGNOSIS — E559 Vitamin D deficiency, unspecified: Secondary | ICD-10-CM

## 2023-01-11 DIAGNOSIS — R5383 Other fatigue: Secondary | ICD-10-CM

## 2023-01-11 DIAGNOSIS — M19042 Primary osteoarthritis, left hand: Secondary | ICD-10-CM

## 2023-01-11 DIAGNOSIS — M5136 Other intervertebral disc degeneration, lumbar region: Secondary | ICD-10-CM

## 2023-01-11 DIAGNOSIS — R21 Rash and other nonspecific skin eruption: Secondary | ICD-10-CM | POA: Diagnosis not present

## 2023-01-11 DIAGNOSIS — R296 Repeated falls: Secondary | ICD-10-CM

## 2023-01-11 DIAGNOSIS — M3219 Other organ or system involvement in systemic lupus erythematosus: Secondary | ICD-10-CM | POA: Diagnosis not present

## 2023-01-11 DIAGNOSIS — Z8269 Family history of other diseases of the musculoskeletal system and connective tissue: Secondary | ICD-10-CM

## 2023-01-11 DIAGNOSIS — K219 Gastro-esophageal reflux disease without esophagitis: Secondary | ICD-10-CM

## 2023-01-11 DIAGNOSIS — M5030 Other cervical disc degeneration, unspecified cervical region: Secondary | ICD-10-CM

## 2023-01-11 DIAGNOSIS — G252 Other specified forms of tremor: Secondary | ICD-10-CM

## 2023-01-11 DIAGNOSIS — Z79899 Other long term (current) drug therapy: Secondary | ICD-10-CM | POA: Diagnosis not present

## 2023-01-11 DIAGNOSIS — M8589 Other specified disorders of bone density and structure, multiple sites: Secondary | ICD-10-CM

## 2023-01-11 DIAGNOSIS — R760 Raised antibody titer: Secondary | ICD-10-CM | POA: Diagnosis not present

## 2023-01-11 DIAGNOSIS — R748 Abnormal levels of other serum enzymes: Secondary | ICD-10-CM

## 2023-01-11 DIAGNOSIS — K760 Fatty (change of) liver, not elsewhere classified: Secondary | ICD-10-CM

## 2023-01-11 DIAGNOSIS — I10 Essential (primary) hypertension: Secondary | ICD-10-CM

## 2023-01-11 LAB — CBC WITH DIFFERENTIAL/PLATELET
Basophils Relative: 0.7 %
Lymphs Abs: 545 cells/uL — ABNORMAL LOW (ref 850–3900)
MCHC: 34 g/dL (ref 32.0–36.0)
MCV: 93.5 fL (ref 80.0–100.0)
Monocytes Relative: 7.7 %
Neutrophils Relative %: 81.1 %

## 2023-01-11 LAB — SEDIMENTATION RATE: Sed Rate: 6 mm/h (ref 0–30)

## 2023-01-11 NOTE — Patient Instructions (Addendum)
Please schedule an appointment with Dr. Frances Furbish (neurology)  Vaccines You are taking a medication(s) that can suppress your immune system.  The following immunizations are recommended: Flu annually Covid-19  RSV Td/Tdap (tetanus, diphtheria, pertussis) every 10 years Pneumonia (Prevnar 15 then Pneumovax 23 at least 1 year apart.  Alternatively, can take Prevnar 20 without needing additional dose) Shingrix: 2 doses from 4 weeks to 6 months apart  Please check with your PCP to make sure you are up to date.

## 2023-01-12 ENCOUNTER — Telehealth: Payer: Self-pay | Admitting: *Deleted

## 2023-01-12 DIAGNOSIS — L304 Erythema intertrigo: Secondary | ICD-10-CM | POA: Diagnosis not present

## 2023-01-12 LAB — COMPLETE METABOLIC PANEL WITH GFR
AG Ratio: 1.8 (calc) (ref 1.0–2.5)
ALT: 25 U/L (ref 6–29)
AST: 22 U/L (ref 10–35)
Albumin: 4.4 g/dL (ref 3.6–5.1)
Alkaline phosphatase (APISO): 61 U/L (ref 37–153)
CO2: 27 mmol/L (ref 20–32)
Calcium: 9.1 mg/dL (ref 8.6–10.4)
Potassium: 4.3 mmol/L (ref 3.5–5.3)
Total Bilirubin: 0.6 mg/dL (ref 0.2–1.2)
eGFR: 52 mL/min/{1.73_m2} — ABNORMAL LOW (ref >=60)

## 2023-01-12 LAB — CBC WITH DIFFERENTIAL/PLATELET
Eosinophils Relative: 0.4 %
MPV: 10.9 fL (ref 7.5–12.5)
Neutro Abs: 4379 cells/uL (ref 1500–7800)
Platelets: 250 10*3/uL (ref 140–400)
RBC: 4.28 10*6/uL (ref 3.80–5.10)

## 2023-01-12 LAB — PROTEIN / CREATININE RATIO, URINE
Creatinine, Urine: 60 mg/dL (ref 20–275)
Protein/Creatinine Ratio: 0.15 mg/mg creat (ref 0.024–0.184)
Total Protein, Urine: 9 mg/dL (ref 5–24)

## 2023-01-12 NOTE — Telephone Encounter (Signed)
Received DEXA results from [facility].  Date of Scan: 01/07/2023  Lowest T-score: -2.4  BMD: 0.779  Lowest site measured:AP Spine  DX: Osteopenia  Significant changes in BMD and site measured (5% and above):-7 % Left Total Femur, -7% AP Spine  Current Regimen:N/A  Recommendation:Discuss at the follow up visit.   Reviewed by: Dr. Pollyann Savoy   Next Appointment:  04/29/2023

## 2023-01-13 LAB — COMPLETE METABOLIC PANEL WITH GFR
BUN/Creatinine Ratio: 6 (calc) (ref 6–22)
BUN: 7 mg/dL (ref 7–25)
Chloride: 95 mmol/L — ABNORMAL LOW (ref 98–110)
Creat: 1.18 mg/dL — ABNORMAL HIGH (ref 0.50–1.05)
Globulin: 2.5 g/dL (calc) (ref 1.9–3.7)
Glucose, Bld: 88 mg/dL (ref 65–99)
Sodium: 131 mmol/L — ABNORMAL LOW (ref 135–146)
Total Protein: 6.9 g/dL (ref 6.1–8.1)

## 2023-01-13 LAB — C3 AND C4
C3 Complement: 130 mg/dL (ref 83–193)
C4 Complement: 21 mg/dL (ref 15–57)

## 2023-01-13 LAB — CBC WITH DIFFERENTIAL/PLATELET
Absolute Monocytes: 416 cells/uL (ref 200–950)
Basophils Absolute: 38 cells/uL (ref 0–200)
Eosinophils Absolute: 22 cells/uL (ref 15–500)
HCT: 40 % (ref 35.0–45.0)
Hemoglobin: 13.6 g/dL (ref 11.7–15.5)
MCH: 31.8 pg (ref 27.0–33.0)
RDW: 12.6 % (ref 11.0–15.0)
Total Lymphocyte: 10.1 %
WBC: 5.4 10*3/uL (ref 3.8–10.8)

## 2023-01-13 LAB — ANTI-NUCLEAR AB-TITER (ANA TITER): ANA Titer 1: 1:40 {titer} — ABNORMAL HIGH

## 2023-01-13 LAB — PROTEIN / CREATININE RATIO, URINE: Protein/Creat Ratio: 150 mg/g creat (ref 24–184)

## 2023-01-13 LAB — ANA: Anti Nuclear Antibody (ANA): POSITIVE — AB

## 2023-01-13 LAB — ANTI-DNA ANTIBODY, DOUBLE-STRANDED: ds DNA Ab: 3 IU/mL

## 2023-01-18 NOTE — Progress Notes (Signed)
Lymphocyte count is low due to immunosuppression.  Creatinine is elevated with low sodium and low chloride.  ANA is low titer positive.  Anticardiolipin and beta-2 GP 1 antibodies remain positive.  Sed rate is normal.  Complements are normal.  Double-stranded DNA is negative.  Labs do not indicate an autoimmune disease flare.  Please refer to nephrology for the evaluation of elevated creatinine and notify patient about the referral.  Please forward results to her PCP.

## 2023-01-19 ENCOUNTER — Encounter: Payer: Self-pay | Admitting: Rheumatology

## 2023-01-19 ENCOUNTER — Telehealth: Payer: Self-pay | Admitting: *Deleted

## 2023-01-19 DIAGNOSIS — M3219 Other organ or system involvement in systemic lupus erythematosus: Secondary | ICD-10-CM

## 2023-01-19 DIAGNOSIS — R7989 Other specified abnormal findings of blood chemistry: Secondary | ICD-10-CM

## 2023-01-19 NOTE — Telephone Encounter (Signed)
-----   Message from [facility] sent at 01/18/2023  9:22 PM EDT ----- Lymphocyte count is low due to immunosuppression.  Creatinine is elevated with low sodium and low chloride.  ANA is low titer positive.  Anticardiolipin and beta-2 GP 1 antibodies remain positive.  Sed rate is normal.  Complements are normal.  Double -stranded DNA is negative.  Labs do not indicate an autoimmune disease flare.  Please refer to nephrology for the evaluation of elevated creatinine and notify patient about the referral.  Please forward results to her PCP.

## 2023-01-19 NOTE — Progress Notes (Signed)
DEXA scan consistent with osteopenia.  Will discuss results at the follow-up visit.  Please attention note to discuss DEXA scan at the follow-up visit.

## 2023-02-08 ENCOUNTER — Encounter: Payer: Self-pay | Admitting: Registered Nurse

## 2023-02-08 ENCOUNTER — Encounter: Payer: HMO | Attending: Physical Medicine and Rehabilitation | Admitting: Registered Nurse

## 2023-02-08 VITALS — BP 136/88 | HR 80 | Ht 61.0 in | Wt 192.0 lb

## 2023-02-08 DIAGNOSIS — Z5181 Encounter for therapeutic drug level monitoring: Secondary | ICD-10-CM | POA: Insufficient documentation

## 2023-02-08 DIAGNOSIS — M2550 Pain in unspecified joint: Secondary | ICD-10-CM | POA: Insufficient documentation

## 2023-02-08 DIAGNOSIS — M79671 Pain in right foot: Secondary | ICD-10-CM | POA: Insufficient documentation

## 2023-02-08 DIAGNOSIS — R2681 Unsteadiness on feet: Secondary | ICD-10-CM | POA: Insufficient documentation

## 2023-02-08 DIAGNOSIS — M5412 Radiculopathy, cervical region: Secondary | ICD-10-CM | POA: Diagnosis not present

## 2023-02-08 DIAGNOSIS — M79672 Pain in left foot: Secondary | ICD-10-CM | POA: Diagnosis not present

## 2023-02-08 DIAGNOSIS — G894 Chronic pain syndrome: Secondary | ICD-10-CM | POA: Diagnosis not present

## 2023-02-08 DIAGNOSIS — G8929 Other chronic pain: Secondary | ICD-10-CM | POA: Diagnosis not present

## 2023-02-08 DIAGNOSIS — M5136 Other intervertebral disc degeneration, lumbar region: Secondary | ICD-10-CM | POA: Insufficient documentation

## 2023-02-08 DIAGNOSIS — Z79891 Long term (current) use of opiate analgesic: Secondary | ICD-10-CM | POA: Diagnosis not present

## 2023-02-08 DIAGNOSIS — M542 Cervicalgia: Secondary | ICD-10-CM | POA: Diagnosis not present

## 2023-02-08 DIAGNOSIS — M25562 Pain in left knee: Secondary | ICD-10-CM | POA: Diagnosis not present

## 2023-02-08 DIAGNOSIS — M7918 Myalgia, other site: Secondary | ICD-10-CM

## 2023-02-08 DIAGNOSIS — M5020 Other cervical disc displacement, unspecified cervical region: Secondary | ICD-10-CM

## 2023-02-08 DIAGNOSIS — M797 Fibromyalgia: Secondary | ICD-10-CM | POA: Diagnosis not present
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
## 2023-02-13 LAB — TOXASSURE SELECT,+ANTIDEPR,UR

## 2023-02-15 ENCOUNTER — Encounter: Payer: Self-pay | Admitting: Physical Medicine and Rehabilitation

## 2023-03-02 ENCOUNTER — Encounter: Payer: Self-pay | Admitting: Physical Medicine and Rehabilitation

## 2023-03-02 ENCOUNTER — Ambulatory Visit (INDEPENDENT_AMBULATORY_CARE_PROVIDER_SITE_OTHER): Payer: HMO | Admitting: Physical Medicine and Rehabilitation

## 2023-03-02 DIAGNOSIS — G8929 Other chronic pain: Secondary | ICD-10-CM

## 2023-03-02 DIAGNOSIS — M797 Fibromyalgia: Secondary | ICD-10-CM | POA: Diagnosis not present

## 2023-03-02 DIAGNOSIS — G894 Chronic pain syndrome: Secondary | ICD-10-CM

## 2023-03-02 DIAGNOSIS — M5416 Radiculopathy, lumbar region: Secondary | ICD-10-CM

## 2023-03-02 DIAGNOSIS — M5116 Intervertebral disc disorders with radiculopathy, lumbar region: Secondary | ICD-10-CM

## 2023-03-02 NOTE — Progress Notes (Unsigned)
Terri Hurley - 62 y.o. female MRN [id number]  Date of birth: Jun 02, 1961  Office Visit Note: Visit Date: 03/02/2023 PCP: Aliene Beams, MD Referred by: Aliene Beams, MD  Subjective: Chief Complaint  Patient presents with  . Lower Back - Pain   HPI: Terri Hurley is a 62 y.o. female who comes in today for evaluation of chronic, worsening and severe bilateral lower back radiating to buttocks, left greater than right. Pain ongoing for several years, worsens with movement and activity. She describes pain as sore and aching, currently rates as 9 out of 10. She has tried home exercise regimen, formal physical therapy and numerous medications such as Gabapentin, Lyrica, Tramadol and Lidocaine patches with minimal relief. Does carry diagnosis of fibromyalgia and lupus. She is currently being treated for chronic pain by Dr. Gen[facility], she is prescribed 180 tablets of 50 mg Tramadol tablets a month. We did refer patient to Dr. Marikay Alar at [facility] in 2022, he obtained new lumbar MRI imaging at that time that shows multi level facet hypertrophy, regression of L5-S1 disc extrusion, there is small residual sequestered disc fragment that remains in left lateral recess potentially affecting the left S1 nerve root. No high grade spinal canal stenosis. This shows improvement of disc extrusion at L5-S1 from 2020 lumbar MRI imaging.  Dr. Yetta Barre did not recommend surgical intervention at that time. Patient has undergone multiple lumbar epidural steroid injections in our office with intermittent relief of pain. Last injection was bilateral S1 transforaminal epidural steroid injection on 03/25/2023, she reports 2 months of significant relief, greater then 50% with this procedure. Patient denies focal weakness, numbness and tingling. No recent trauma or falls.   Review of Systems  Musculoskeletal:  Positive for back pain and myalgias.  Neurological:   Negative for tingling, sensory change, focal weakness and weakness.   Otherwise per HPI.  Assessment & Plan: Visit Diagnoses:    ICD-10-CM   1. Chronic bilateral low back pain with bilateral sciatica  M54.42 Ambulatory referral to [facility]   M54.41    G89.29     2. Lumbar radiculopathy  M54.16 Ambulatory referral to [facility]    3. Intervertebral disc disorders with radiculopathy, lumbar region  M51.16 Ambulatory referral to [facility]    4. Fibromyalgia  M79.7 Ambulatory referral to [facility]    5. Chronic pain syndrome  G89.4 Ambulatory referral to [facility]       Plan: Findings:  Chronic, worsening and severe bilateral lower back radiating to buttocks, left greater than right. Patient continues to have severe pain despite good conservative therapies such as home exercise regimen, rest and use of medications. Patients clinical presentation and exam are consistent with S1 nerve pattern. I also feel her fibromyalgia and lupus are working to exacerbate her pain. Next step is to perform diagnostic and hopefully therapeutic bilateral S1 transforaminal epidural steroid injection under fluoroscopic guidance. If good relief of pain with injection we can repeat this injection infrequently as needed. If her pain persists post injection would consider obtaining new lumbar MRI imaging. I instructed patient to continue chronic pain management with Dr. Berline Chough, can follow up with her as needed. No red flag symptoms noted upon exam today.     Meds & Orders: No orders of the defined types were placed in this encounter.   Orders Placed This Encounter  Procedures  . Ambulatory referral to [facility]  Follow-up: Return for Bilateral S1 transforaminal epidural steroid injection.   Procedures: No procedures performed      Clinical History: EXAM: MRI LUMBAR SPINE WITHOUT CONTRAST  TECHNIQUE: Multiplanar,  multisequence MR imaging of the lumbar spine was performed. No intravenous contrast was administered.  COMPARISON: Lumbar spine MRI 03/29/2019  FINDINGS: Segmentation: Standard.  Alignment: Mild lumbar dextroscoliosis. No listhesis.  Vertebrae: No fracture, suspicious marrow lesion, or significant marrow edema.  Conus medullaris and cauda equina: Conus extends to the L1 level. Conus and cauda equina appear normal.  Paraspinal and other soft tissues: Unremarkable.  Disc levels:  Mild disc desiccation and up to mild disc space narrowing throughout the lumbar spine.  L1-2: Mild disc bulging eccentric to the left, endplate spurring, and slight facet hypertrophy result in borderline to mild left neural foraminal stenosis without spinal stenosis, unchanged.  L2-3: Disc bulging eccentric to the right, endplate spurring, and mild facet hypertrophy result in mild right neural foraminal stenosis without spinal stenosis, unchanged.  L3-4: Mild disc bulging, endplate spurring, and mild facet hypertrophy result in borderline left neural foraminal stenosis without spinal stenosis, unchanged.  L4-5: Disc bulging, endplate spurring, and mild facet hypertrophy result in borderline to mild bilateral neural foraminal stenosis without spinal stenosis, unchanged.  L5-S1: The central disc extrusion on the prior study has regressed. There is a shallow residual central disc protrusion with annular fissure in close proximity to the left greater than right S1 nerve roots without evidence of neural compression or significant stenosis. A 6 mm focus of soft tissue in the left lateral recess inferior to the disc space presumably reflects a residual disc fragment, overall smaller than on the prior study though potentially irritating the left S1 nerve root. Mild facet hypertrophy. Patent neural foramina.  IMPRESSION: 1. Regression of L5-S1 disc extrusion. Persistent small residual sequestered disc  fragment in the left lateral recess potentially affecting the left S1 nerve root. 2. Unchanged disc and facet degeneration elsewhere with up to mild neural foraminal stenosis. No spinal stenosis.   Electronically Signed By: Sebastian Ache M.D. On: 05/15/2021 19:20   She reports that she has never smoked. She has never been exposed to tobacco smoke. She has never used smokeless tobacco. No results for input(s): "HGBA1C", "LABURIC" in the last 8760 hours.  Objective:  VS:  HT:    WT:   BMI:     BP:   HR: bpm  TEMP: ( )  RESP:  Physical Exam Vitals and nursing note reviewed.  HENT:     Head: Normocephalic and atraumatic.     Right Ear: External ear normal.     Left Ear: External ear normal.     Nose: Nose normal.     Mouth/Throat:     Mouth: Mucous membranes are moist.  Eyes:     Extraocular Movements: Extraocular movements intact.  Cardiovascular:     Rate and Rhythm: Normal rate.     Pulses: Normal pulses.  Pulmonary:     Effort: Pulmonary effort is normal.  Abdominal:     General: Abdomen is flat. There is no distension.  Musculoskeletal:        General: Tenderness present.     Cervical back: Normal range of motion.     Comments: Patient rises from seated position to standing without difficulty. Good lumbar range of motion. No pain noted with facet loading. 5/5 strength noted with bilateral hip flexion, knee flexion/extension, ankle dorsiflexion/plantarflexion and EHL. No clonus noted bilaterally. No pain upon palpation of greater  trochanters. No pain with internal/external rotation of bilateral hips. Sensation intact bilaterally. Dysesthesias noted to bilateral S1 dermatomes. Negative slump test bilaterally. Ambulates without aid, gait steady.     Skin:    General: Skin is warm and dry.     Capillary Refill: Capillary refill takes less than 2 seconds.  Neurological:     General: No focal deficit present.     Mental Status: She is alert and oriented to person, place, and  time.  Psychiatric:        Mood and Affect: Mood normal.        Behavior: Behavior normal.    Ortho Exam  Imaging: No results found.  Past Medical/Family/Surgical/Social History: Medications & Allergies reviewed per EMR, new medications updated. Patient Active Problem List   Diagnosis Date Noted  . Hx of adenomatous colonic polyps 08/12/2021  . Nerve pain 03/19/2021  . Antiphospholipid antibody positive 02/26/2021  . Unsteady gait 03/04/2020  . Vertigo 03/04/2020  . Severe episode of recurrent major depressive disorder, without psychotic features (HCC) 01/17/2020  . Chronic pain syndrome 09/27/2019  . Encounter for therapeutic drug monitoring 09/27/2019  . Encounter for monitoring opioid maintenance therapy 09/27/2019  . Bladder spasms 06/21/2019  . Protrusion of lumbar intervertebral disc 05/16/2019  . Allodynia 03/23/2019  . Fibromyalgia 03/23/2019  . Myofascial pain dysfunction syndrome 03/23/2019  . SLE (systemic lupus erythematosus) (HCC)   . Primary osteoarthritis of both hands 10/01/2017  . DDD (degenerative disc disease), cervical 08/23/2017  . DDD (degenerative disc disease), lumbar 08/23/2017  . Essential hypertension 01/28/2016  . Incontinence of urine 12/26/2010  . Depression   . GERD (gastroesophageal reflux disease)   . Fatty liver disease, nonalcoholic   . HNP (herniated nucleus pulposus), cervical   . Recurrent genital HSV (herpes simplex virus) infection   . Plantar fasciitis   . Elevated liver enzymes 12/12/2010  . Vitamin D deficiency 12/12/2010   Past Medical History:  Diagnosis Date  . Arthritis    low back, hip, hands  . Depression    Sees Donnie Aho, NP @ [facility]  counseling center.Marland Kitchen Has h/o hospitalization  . Fatty liver disease, nonalcoholic    clinical diagnosis by endocrinologist  . Fibromyalgia   . GERD (gastroesophageal reflux disease)    controlled with PPI therapy  . Hepatitis unknown type   . High cholesterol    No  medical therapy. Last lab March '12  LDL 91, T. Chol 170. Minimal elevation in '08  . HNP (herniated nucleus pulposus), cervical    C6-7. Has had PT, no surgery  . Hypertension   . Iron deficiency anemia    per patient  . Plantar fasciitis   . Recurrent genital HSV (herpes simplex virus) infection    on daily suppression with acyclovir  . SLE (systemic lupus erythematosus) (HCC)    sees rheum, on plaquenil  . Urinary incontinence   . Vitamin D deficiency    lab '09  Vit D = 19   Family History  Problem Relation Age of Onset  . Heart disease Mother 56       AMI  . Diabetes Mother        peripheral neuropathy  . Hypertension Mother   . Hyperlipidemia Mother   . Mental illness Mother        depression and anxiety  . Gout Mother   . Deep vein thrombosis Mother   . Diabetes Father   . Heart disease Father   . Hyperlipidemia Father   .  Mental illness Father        depression and anxiety  . Cancer Sister 13       breast cancer  . Mental illness Sister   . Asthma Sister   . Thyroid disease Sister   . COPD Sister   . Mental illness Sister   . Mental illness Sister   . Heart disease Brother        arrythmia, DCC  . Hyperlipidemia Brother   . Mental illness Brother   . COPD Brother   . Stroke Brother   . Hyperlipidemia Brother   . Hypertension Brother   . Mental illness Brother   . Mental illness Brother   . Mental illness Brother   . GER disease Son   . Breast cancer Neg Hx    Past Surgical History:  Procedure Laterality Date  . BIOPSY  08/12/2021   Procedure: BIOPSY;  Surgeon: Charlott Rakes, MD;  Location: [facility];  Service: Endoscopy;;  . CHOLECYSTECTOMY    . COLONOSCOPY WITH PROPOFOL N/A 08/12/2021   Procedure: COLONOSCOPY WITH PROPOFOL;  Surgeon: Charlott Rakes, MD;  Location: [facility];  Service: Endoscopy;  Laterality: N/A;  . INCONTINENCE SURGERY  2009  . POLYPECTOMY  08/12/2021   Procedure: POLYPECTOMY;  Surgeon: Charlott Rakes, MD;   Location: [facility];  Service: Endoscopy;;  . SPINE SURGERY  05/2014   Cervical spine surgery C4-5, C5-6.  [place]  Marland Kitchen TOTAL ABDOMINAL HYSTERECTOMY W/ BILATERAL SALPINGOOPHORECTOMY  2009   with repair of cystocele and rectocele    Social History   Occupational History  . Occupation: HAIR DRESSER  Tobacco Use  . Smoking status: Never    Passive exposure: Never  . Smokeless tobacco: Never  Vaping Use  . Vaping status: Never Used  Substance and Sexual Activity  . Alcohol use: No  . Drug use: No  . Sexual activity: Never    Birth control/protection: Surgical, Post-menopausal

## 2023-03-02 NOTE — Progress Notes (Unsigned)
Functional Pain Scale - descriptive words and definitions  Distracting (5)    Aware of pain/able to complete some ADL's but limited by pain/sleep is affected and active distractions are only slightly useful. Moderate range order  Average Pain 3, but can be 7-8  Lower back pain on left side with no radiation in the legs.

## 2023-03-03 DIAGNOSIS — F4323 Adjustment disorder with mixed anxiety and depressed mood: Secondary | ICD-10-CM | POA: Diagnosis not present

## 2023-03-09 ENCOUNTER — Telehealth: Payer: Self-pay | Admitting: Physical Medicine and Rehabilitation

## 2023-03-09 NOTE — Telephone Encounter (Signed)
LVM to return call to schedule injection 

## 2023-03-09 NOTE — Telephone Encounter (Signed)
Patient called returning your call. CB#[phone]

## 2023-03-10 DIAGNOSIS — F4323 Adjustment disorder with mixed anxiety and depressed mood: Secondary | ICD-10-CM | POA: Diagnosis not present

## 2023-03-22 ENCOUNTER — Other Ambulatory Visit: Payer: Self-pay

## 2023-03-22 ENCOUNTER — Ambulatory Visit (INDEPENDENT_AMBULATORY_CARE_PROVIDER_SITE_OTHER): Payer: HMO | Admitting: Physical Medicine and Rehabilitation

## 2023-03-22 VITALS — BP 149/70 | HR 71

## 2023-03-22 DIAGNOSIS — M5416 Radiculopathy, lumbar region: Secondary | ICD-10-CM

## 2023-03-22 MED ORDER — METHYLPREDNISOLONE ACETATE 80 MG/ML IJ SUSP
80.0000 mg | Freq: Once | INTRAMUSCULAR | Status: AC
  Administered 2023-03-22: 80 mg

## 2023-03-22 NOTE — Patient Instructions (Signed)

## 2023-03-22 NOTE — Procedures (Signed)
Lumbosacral Transforaminal Epidural Steroid Injection - Sub-Pedicular Approach with Fluoroscopic Guidance  Patient: Terri Hurley      Date of Birth: May 29, 1961 MRN: [id number] PCP: Aliene Beams, MD      Visit Date: 03/22/2023   Universal Protocol:    Date/Time: 03/22/2023  Consent Given By: the patient  Position: PRONE  Additional Comments: Vital signs were monitored before and after the procedure. Patient was prepped and draped in the usual sterile fashion. The correct patient, procedure, and site was verified.   Injection Procedure Details:   Procedure diagnoses: Lumbar radiculopathy [M54.16]    Meds Administered:  Meds ordered this encounter  Medications   methylPREDNISolone acetate (DEPO-MEDROL) injection 80 mg    Laterality: Bilateral  Location/Site: S1  Needle:5.0 in., 22 ga.  Short bevel or Quincke spinal needle  Needle Placement: Transforaminal  Findings:    -Comments: Excellent flow of contrast along the nerve, nerve root and into the epidural space.  Procedure Details: After squaring off the end-plates to get a true AP view, the C-arm was positioned so that an oblique view of the foramen as noted above was visualized. The target area is just inferior to the "nose of the scotty dog" or sub pedicular. The soft tissues overlying this structure were infiltrated with 2-3 ml. of 1% Lidocaine without Epinephrine.  The spinal needle was inserted toward the target using a "trajectory" view along the fluoroscope beam.  Under AP and lateral visualization, the needle was advanced so it did not puncture dura and was located close the 6 O'Clock position of the pedical in AP tracterory. Biplanar projections were used to confirm position. Aspiration was confirmed to be negative for CSF and/or blood. A 1-2 ml. volume of Isovue-250 was injected and flow of contrast was noted at each level. Radiographs were obtained for documentation purposes.   After attaining the desired  flow of contrast documented above, a 0.5 to 1.0 ml test dose of 0.25% Marcaine was injected into each respective transforaminal space.  The patient was observed for 90 seconds post injection.  After no sensory deficits were reported, and normal lower extremity motor function was noted,   the above injectate was administered so that equal amounts of the injectate were placed at each foramen (level) into the transforaminal epidural space.   Additional Comments:  No complications occurred Dressing: 2 x 2 sterile gauze and Band-Aid    Post-procedure details: Patient was observed during the procedure. Post-procedure instructions were reviewed.  Patient left the clinic in stable condition.

## 2023-03-22 NOTE — Progress Notes (Signed)
Terri Hurley - 62 y.o. female MRN [id number]  Date of birth: [birth year]  Office Visit Note: Visit Date: 03/22/2023 PCP: Aliene Beams, MD Referred by: Aliene Beams, MD  Subjective: Chief Complaint  Patient presents with   Lower Back - Pain   HPI:  Terri Hurley is a 62 y.o. female who comes in today at the request of Ellin Goodie, FNP for planned Bilateral S1-2 Lumbar Transforaminal epidural steroid injection with fluoroscopic guidance.  The patient has failed conservative care including home exercise, medications, time and activity modification.  This injection will be diagnostic and hopefully therapeutic.  Please see requesting physician notes for further details and justification.   ROS Otherwise per HPI.  Assessment & Plan: Visit Diagnoses:    ICD-10-CM   1. Lumbar radiculopathy  M54.16 XR C-ARM NO REPORT    Epidural Steroid injection    methylPREDNISolone acetate (DEPO-MEDROL) injection 80 mg      Plan: No additional findings.   Meds & Orders:  Meds ordered this encounter  Medications   methylPREDNISolone acetate (DEPO-MEDROL) injection 80 mg    Orders Placed This Encounter  Procedures   XR C-ARM NO REPORT   Epidural Steroid injection    Follow-up: Return for visit to requesting provider as needed.   Procedures: No procedures performed  Lumbosacral Transforaminal Epidural Steroid Injection - Sub-Pedicular Approach with Fluoroscopic Guidance  Patient: Terri Hurley      Date of Birth: Jan 10, 1961 MRN: [id number] PCP: Aliene Beams, MD      Visit Date: 03/22/2023   Universal Protocol:    Date/Time: 03/22/2023  Consent Given By: the patient  Position: PRONE  Additional Comments: Vital signs were monitored before and after the procedure. Patient was prepped and draped in the usual sterile fashion. The correct patient, procedure, and site was verified.   Injection Procedure Details:   Procedure diagnoses: Lumbar radiculopathy [M54.16]     Meds Administered:  Meds ordered this encounter  Medications   methylPREDNISolone acetate (DEPO-MEDROL) injection 80 mg    Laterality: Bilateral  Location/Site: S1  Needle:5.0 in., 22 ga.  Short bevel or Quincke spinal needle  Needle Placement: Transforaminal  Findings:    -Comments: Excellent flow of contrast along the nerve, nerve root and into the epidural space.  Procedure Details: After squaring off the end-plates to get a true AP view, the C-arm was positioned so that an oblique view of the foramen as noted above was visualized. The target area is just inferior to the "nose of the scotty dog" or sub pedicular. The soft tissues overlying this structure were infiltrated with 2-3 ml. of 1% Lidocaine without Epinephrine.  The spinal needle was inserted toward the target using a "trajectory" view along the fluoroscope beam.  Under AP and lateral visualization, the needle was advanced so it did not puncture dura and was located close the 6 O'Clock position of the pedical in AP tracterory. Biplanar projections were used to confirm position. Aspiration was confirmed to be negative for CSF and/or blood. A 1-2 ml. volume of Isovue-250 was injected and flow of contrast was noted at each level. Radiographs were obtained for documentation purposes.   After attaining the desired flow of contrast documented above, a 0.5 to 1.0 ml test dose of 0.25% Marcaine was injected into each respective transforaminal space.  The patient was observed for 90 seconds post injection.  After no sensory deficits were reported, and normal lower extremity motor function was noted,   the above injectate was administered so  that equal amounts of the injectate were placed at each foramen (level) into the transforaminal epidural space.   Additional Comments:  No complications occurred Dressing: 2 x 2 sterile gauze and Band-Aid    Post-procedure details: Patient was observed during the procedure. Post-procedure  instructions were reviewed.  Patient left the clinic in stable condition.    Clinical History: EXAM: MRI LUMBAR SPINE WITHOUT CONTRAST  TECHNIQUE: Multiplanar, multisequence MR imaging of the lumbar spine was performed. No intravenous contrast was administered.  COMPARISON: Lumbar spine MRI 03/29/2019  FINDINGS: Segmentation: Standard.  Alignment: Mild lumbar dextroscoliosis. No listhesis.  Vertebrae: No fracture, suspicious marrow lesion, or significant marrow edema.  Conus medullaris and cauda equina: Conus extends to the L1 level. Conus and cauda equina appear normal.  Paraspinal and other soft tissues: Unremarkable.  Disc levels:  Mild disc desiccation and up to mild disc space narrowing throughout the lumbar spine.  L1-2: Mild disc bulging eccentric to the left, endplate spurring, and slight facet hypertrophy result in borderline to mild left neural foraminal stenosis without spinal stenosis, unchanged.  L2-3: Disc bulging eccentric to the right, endplate spurring, and mild facet hypertrophy result in mild right neural foraminal stenosis without spinal stenosis, unchanged.  L3-4: Mild disc bulging, endplate spurring, and mild facet hypertrophy result in borderline left neural foraminal stenosis without spinal stenosis, unchanged.  L4-5: Disc bulging, endplate spurring, and mild facet hypertrophy result in borderline to mild bilateral neural foraminal stenosis without spinal stenosis, unchanged.  L5-S1: The central disc extrusion on the prior study has regressed. There is a shallow residual central disc protrusion with annular fissure in close proximity to the left greater than right S1 nerve roots without evidence of neural compression or significant stenosis. A 6 mm focus of soft tissue in the left lateral recess inferior to the disc space presumably reflects a residual disc fragment, overall smaller than on the prior study though potentially irritating  the left S1 nerve root. Mild facet hypertrophy. Patent neural foramina.  IMPRESSION: 1. Regression of L5-S1 disc extrusion. Persistent small residual sequestered disc fragment in the left lateral recess potentially affecting the left S1 nerve root. 2. Unchanged disc and facet degeneration elsewhere with up to mild neural foraminal stenosis. No spinal stenosis.   Electronically Signed By: Sebastian Ache M.D. On: 05/15/2021 19:20     Objective:  VS:  HT:    WT:   BMI:     BP:(!) 149/70  HR:71bpm  TEMP: ( )  RESP:  Physical Exam Vitals and nursing note reviewed.  Constitutional:      General: She is not in acute distress.    Appearance: Normal appearance. She is not ill-appearing.  HENT:     Head: Normocephalic and atraumatic.     Right Ear: External ear normal.     Left Ear: External ear normal.  Eyes:     Extraocular Movements: Extraocular movements intact.  Cardiovascular:     Rate and Rhythm: Normal rate.     Pulses: Normal pulses.  Pulmonary:     Effort: Pulmonary effort is normal. No respiratory distress.  Abdominal:     General: There is no distension.     Palpations: Abdomen is soft.  Musculoskeletal:        General: Tenderness present.     Cervical back: Neck supple.     Right lower leg: No edema.     Left lower leg: No edema.     Comments: Patient has good distal strength with no pain over  the greater trochanters.  No clonus or focal weakness.  Skin:    Findings: No erythema, lesion or rash.  Neurological:     General: No focal deficit present.     Mental Status: She is alert and oriented to person, place, and time.     Sensory: No sensory deficit.     Motor: No weakness or abnormal muscle tone.     Coordination: Coordination normal.  Psychiatric:        Mood and Affect: Mood normal.        Behavior: Behavior normal.      Imaging: No results found.

## 2023-03-22 NOTE — Progress Notes (Signed)
Functional Pain Scale - descriptive words and definitions  Distressing (6)    Pain is present/unable to complete most ADLs limited by pain/sleep is difficult and active distraction is only marginal. Moderate range order  Average Pain 6-7   +Driver, -BT, -Dye Allergies.  Lower back pain on both sides

## 2023-03-26 ENCOUNTER — Other Ambulatory Visit: Payer: Self-pay | Admitting: Rheumatology

## 2023-03-26 DIAGNOSIS — M3219 Other organ or system involvement in systemic lupus erythematosus: Secondary | ICD-10-CM

## 2023-03-26 NOTE — Telephone Encounter (Signed)
Last Fill: 12/28/2022  Eye exam:  08/12/2022 WNL    Labs: 01/11/2023 Lymphocyte count is low due to immunosuppression.  Creatinine is elevated with low sodium and low chloride.     Next Visit: 04/29/2023  Last Visit: 01/11/2023  DX:Other systemic lupus erythematosus with other organ involvement   Current Dose per office note 01/11/2023: Plaquenil 200 mg 1 tablet twice daily   Okay to refill Plaquenil?
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
## 2023-04-19 ENCOUNTER — Encounter: Payer: HMO | Attending: Physical Medicine and Rehabilitation | Admitting: Physical Medicine and Rehabilitation

## 2023-04-19 ENCOUNTER — Encounter: Payer: Self-pay | Admitting: Physical Medicine and Rehabilitation

## 2023-04-19 VITALS — BP 128/83 | HR 69 | Ht 61.0 in | Wt 192.0 lb

## 2023-04-19 DIAGNOSIS — M329 Systemic lupus erythematosus, unspecified: Secondary | ICD-10-CM

## 2023-04-19 DIAGNOSIS — M797 Fibromyalgia: Secondary | ICD-10-CM | POA: Diagnosis not present

## 2023-04-19 DIAGNOSIS — M7918 Myalgia, other site: Secondary | ICD-10-CM

## 2023-04-19 DIAGNOSIS — G894 Chronic pain syndrome: Secondary | ICD-10-CM | POA: Diagnosis not present

## 2023-04-19 DIAGNOSIS — M7061 Trochanteric bursitis, right hip: Secondary | ICD-10-CM

## 2023-04-19 DIAGNOSIS — G2401 Drug induced subacute dyskinesia: Secondary | ICD-10-CM | POA: Diagnosis not present

## 2023-04-19 DIAGNOSIS — M5126 Other intervertebral disc displacement, lumbar region: Secondary | ICD-10-CM | POA: Diagnosis not present

## 2023-04-19 DIAGNOSIS — G5701 Lesion of sciatic nerve, right lower limb: Secondary | ICD-10-CM | POA: Diagnosis not present

## 2023-04-19 MED ORDER — ACETAMINOPHEN-CODEINE 300-30 MG PO TABS: 1.0000 | ORAL_TABLET | Freq: Four times a day (QID) | ORAL | 3 refills | Status: DC | PRN

## 2023-04-19 MED ORDER — GABAPENTIN 400 MG PO CAPS: ORAL_CAPSULE | ORAL | 1 refills | Status: DC

## 2023-04-19 MED ORDER — TRAMADOL HCL 50 MG PO TABS: 50.0000 mg | ORAL_TABLET | Freq: Four times a day (QID) | ORAL | 5 refills | Status: DC | PRN

## 2023-04-19 MED ORDER — PREGABALIN 100 MG PO CAPS: 100.0000 mg | ORAL_CAPSULE | Freq: Two times a day (BID) | ORAL | 1 refills | Status: DC

## 2023-04-19 NOTE — Patient Instructions (Addendum)
Pt is a 62 yr old with severe depression- doesn't think has bipolar d/o per pt, allodynia, Lupus, fibromyalgia, and myofascial pain syndrome, and piriformis syndrome as well as bladder spasms and L4/5 disc issues with need for epidural - s/p epidural February 2022-Also has Lupus followed by Rheum.  Here for f/u on chronic pain -      Suggest tennis ball AND pillow- sit on ~ 10 minutes- to hold pressure - at least 10 minutes- pillow so doesn't have TTO much pressure on tennis ball-   2. Piriformis stretch- cross R leg over L side and pull towards chest and hold 2  minutes.  Can do multiple times per day to stretch out the muscle.    3.  Can take Tylenol # 3- 1.5 1 1/2 tabs at a time if need be- only takes at night- so I'm ok with this.    4. Just got refill on tramadol and Tylenol # 3- on 04/10/23- try to take tramadol 2x/day-    5. Will refill gabapentin  400 mg in AM and 1200 mg at bedtime- as well as Lyrica/Pregabalin 100 mg BID   6 Takes Tramadol when doesn't feel comfortable taking Tylenol #3- so wants to keep it for now for during daytime  2x/day- and Tylenol #3 1.5 to 2 tabs nightly   7. Needs to talk to Psychiatry since shaking is getting worse- I truly thinks it's tardive dyskinesia.  Meds for tardive dyskinesia was $400- so couldn't afford it.    8. Will get Pt back for R trochanteric bursa injection- on wait list.    9. F/U in 3 months with Riley Lam and 6 months with me, however will put on wait list for R hip trochanteric bursa injection.
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
## 2023-04-20 DIAGNOSIS — F4323 Adjustment disorder with mixed anxiety and depressed mood: Secondary | ICD-10-CM | POA: Diagnosis not present

## 2023-04-21 DIAGNOSIS — M329 Systemic lupus erythematosus, unspecified: Secondary | ICD-10-CM | POA: Diagnosis not present

## 2023-04-21 DIAGNOSIS — I129 Hypertensive chronic kidney disease with stage 1 through stage 4 chronic kidney disease, or unspecified chronic kidney disease: Secondary | ICD-10-CM | POA: Diagnosis not present

## 2023-04-21 DIAGNOSIS — G894 Chronic pain syndrome: Secondary | ICD-10-CM | POA: Diagnosis not present

## 2023-04-21 DIAGNOSIS — R760 Raised antibody titer: Secondary | ICD-10-CM | POA: Diagnosis not present

## 2023-04-21 DIAGNOSIS — N1831 Chronic kidney disease, stage 3a: Secondary | ICD-10-CM | POA: Diagnosis not present

## 2023-04-29 ENCOUNTER — Ambulatory Visit: Payer: HMO | Attending: Rheumatology | Admitting: Rheumatology

## 2023-04-29 ENCOUNTER — Encounter: Payer: Self-pay | Admitting: Rheumatology

## 2023-04-29 VITALS — BP 135/78 | HR 75 | Resp 16 | Ht 61.0 in | Wt 192.6 lb

## 2023-04-29 DIAGNOSIS — R5383 Other fatigue: Secondary | ICD-10-CM

## 2023-04-29 DIAGNOSIS — Z79899 Other long term (current) drug therapy: Secondary | ICD-10-CM

## 2023-04-29 DIAGNOSIS — Z8269 Family history of other diseases of the musculoskeletal system and connective tissue: Secondary | ICD-10-CM

## 2023-04-29 DIAGNOSIS — K760 Fatty (change of) liver, not elsewhere classified: Secondary | ICD-10-CM

## 2023-04-29 DIAGNOSIS — M5030 Other cervical disc degeneration, unspecified cervical region: Secondary | ICD-10-CM | POA: Diagnosis not present

## 2023-04-29 DIAGNOSIS — M19042 Primary osteoarthritis, left hand: Secondary | ICD-10-CM

## 2023-04-29 DIAGNOSIS — R760 Raised antibody titer: Secondary | ICD-10-CM

## 2023-04-29 DIAGNOSIS — R21 Rash and other nonspecific skin eruption: Secondary | ICD-10-CM | POA: Diagnosis not present

## 2023-04-29 DIAGNOSIS — E559 Vitamin D deficiency, unspecified: Secondary | ICD-10-CM

## 2023-04-29 DIAGNOSIS — M47816 Spondylosis without myelopathy or radiculopathy, lumbar region: Secondary | ICD-10-CM

## 2023-04-29 DIAGNOSIS — F5101 Primary insomnia: Secondary | ICD-10-CM | POA: Diagnosis not present

## 2023-04-29 DIAGNOSIS — M3219 Other organ or system involvement in systemic lupus erythematosus: Secondary | ICD-10-CM

## 2023-04-29 DIAGNOSIS — M19041 Primary osteoarthritis, right hand: Secondary | ICD-10-CM | POA: Diagnosis not present

## 2023-04-29 DIAGNOSIS — R296 Repeated falls: Secondary | ICD-10-CM

## 2023-04-29 DIAGNOSIS — M8589 Other specified disorders of bone density and structure, multiple sites: Secondary | ICD-10-CM

## 2023-04-29 DIAGNOSIS — M797 Fibromyalgia: Secondary | ICD-10-CM

## 2023-04-29 DIAGNOSIS — K219 Gastro-esophageal reflux disease without esophagitis: Secondary | ICD-10-CM

## 2023-04-29 DIAGNOSIS — R748 Abnormal levels of other serum enzymes: Secondary | ICD-10-CM

## 2023-04-29 DIAGNOSIS — I10 Essential (primary) hypertension: Secondary | ICD-10-CM

## 2023-04-29 DIAGNOSIS — G252 Other specified forms of tremor: Secondary | ICD-10-CM

## 2023-04-29 MED ORDER — ALENDRONATE SODIUM 70 MG PO TABS: 70.0000 mg | ORAL_TABLET | ORAL | 1 refills | Status: DC

## 2023-04-29 NOTE — Progress Notes (Signed)
Pharmacy Note  Subjective: Patient presents today to the [facility] for follow up office visit.  Patient seen by pharmacist for counseling on bisphosphonate therapy for  osteopenia .  Objective: T-score: -2.4 at [facility] (DEXA on 01/07/23). Previous T-score of -1.9 Lab Results  Component Value Date   VD25OH 50 08/18/2019   CMP     Component Value Date/Time   NA 131 (L) 01/11/2023 1205   NA 136 03/04/2020 1326   K 4.3 01/11/2023 1205   CL 95 (L) 01/11/2023 1205   CO2 27 01/11/2023 1205   GLUCOSE 88 01/11/2023 1205   BUN 7 01/11/2023 1205   BUN 7 (L) 03/04/2020 1326   CREATININE 1.18 (H) 01/11/2023 1205   CALCIUM 9.1 01/11/2023 1205   CALCIUM 9.8 12/26/2010 1011   PROT 6.9 01/11/2023 1205   PROT 6.6 03/04/2020 1326   ALBUMIN 4.2 03/04/2020 1326   AST 22 01/11/2023 1205   ALT 25 01/11/2023 1205   ALKPHOS 86 03/04/2020 1326   BILITOT 0.6 01/11/2023 1205   BILITOT 0.2 03/04/2020 1326   GFRNONAA 69 08/20/2020 1153   GFRAA 81 08/20/2020 1153     Assessment/Plan: Counseled patient that alendronate is an oral weekly bisphosphonate that reduces bone turnover by inhibiting osteoclasts that chew up bone.  Counseled patient on purpose, proper use, and adverse effects of alendronate.  Reviewed with patient that alendronate should be taken once weekly.  Alendronate must be taking first thing in the morning, with a full glass of water, and she must wait an hour prior to eating food.  Also advised patient that she should not lie down until after she has eaten.  Provided patient with medication education material and answered all questions.  Reviewed adverse events of alendronate including risk of nausea & diarrhea, headache, and muscle & bone pain.  Reviewed rare adverse effect of osteonecrosis of the jaw and advised patient to alert her dentist that she is on alendronate prior to any major dental work.  Patient confirms she does not have any major dental work scheduled at this  time.  Patient agrees to trial of alendronate at this time.     Reviewed importance of taking calcium and vitamin D with bisphosphonate therapy. Recommended daily amount of calcium is 1200mg  and vitamin D [phone] units.  Advised calcium is better obtained through diet vs supplement.  Counseled about risk of excess calcium supplementation such a kidney stones and increased risk of heart disease.    Chesley Mires, PharmD, MPH, BCPS, CPP Clinical Pharmacist (Rheumatology and Pulmonology)

## 2023-04-29 NOTE — Patient Instructions (Addendum)
Alendronate Tablets What is this medication? ALENDRONATE (a LEN droe nate) prevents and treats osteoporosis. It may also be used to treat Paget disease of the bone. It works by Interior and spatial designer stronger and less likely to break (fracture). It belongs to a group of medications called bisphosphonates. This medicine may be used for other purposes; ask your health care provider or pharmacist if you have questions. COMMON BRAND NAME(S): Fosamax What should I tell my care team before I take this medication? They need to know if you have any of these conditions: Bleeding disorder Cancer Dental disease Difficulty swallowing Infection (fever, chills, cough, sore throat, pain or trouble passing urine) Kidney disease Low levels of calcium or other minerals in the blood Low red blood cell counts Receiving steroids like dexamethasone or prednisone Stomach or intestine problems Trouble sitting or standing for 30 minutes An unusual or allergic reaction to alendronate, other medications, foods, dyes or preservatives Pregnant or trying to get pregnant Breast-feeding How should I use this medication? Take this medication by mouth with a full glass of water. Take it as directed on the prescription label at the same time every day. Take the dose right after waking up. Do not eat or drink anything before taking it. Do not take it with any other drink except water. Do not chew or crush the tablet. After taking it, do not eat breakfast, drink, or take any other medications or vitamins for at least 30 minutes. Sit or stand up for at least 30 minutes after you take it. Do not lie down. Keep taking it unless your care team tells you to stop. A special MedGuide will be given to you by the pharmacist with each prescription and refill. Be sure to read this information carefully each time. Talk to your care team about the use of this medication in children. Special care may be needed. Overdosage: If you think you have  taken too much of this medicine contact a poison control center or emergency room at once. NOTE: This medicine is only for you. Do not share this medicine with others. What if I miss a dose? If you take your medication once a day, skip it. Take your next dose at the scheduled time the next morning. Do not take two doses on the same day. If you take your medication once a week, take the missed dose on the morning after you remember. Do not take two doses on the same day. What may interact with this medication? Aluminum hydroxide Antacids Aspirin Calcium supplements Medications for inflammation like ibuprofen, naproxen, and others Iron supplements Magnesium supplements Vitamins with minerals This list may not describe all possible interactions. Give your health care provider a list of all the medicines, herbs, non-prescription drugs, or dietary supplements you use. Also tell them if you smoke, drink alcohol, or use illegal drugs. Some items may interact with your medicine. What should I watch for while using this medication? Visit your care team for regular checks on your progress. It may be some time before you see the benefit from this medication. Some people who take this medication have severe bone, joint, or muscle pain. This medication may also increase your risk for jaw problems or a broken thigh bone. Tell your care team right away if you have severe pain in your jaw, bones, joints, or muscles. Tell you care team if you have any pain that does not go away or that gets worse. Tell your dentist and dental surgeon that you are  taking this medication. You should not have major dental surgery while on this medication. See your dentist to have a dental exam and fix any dental problems before starting this medication. Take good care of your teeth while on this medication. Make sure you see your dentist for regular follow-up appointments. You should make sure you get enough calcium and vitamin D  while you are taking this medication. Discuss the foods you eat and the vitamins you take with your care team. You may need blood work done while you are taking this medication. What side effects may I notice from receiving this medication? Side effects that you should report to your care team as soon as possible: Allergic reactions--skin rash, itching, hives, swelling of the face, lips, tongue, or throat Low calcium level--muscle pain or cramps, confusion, tingling, or numbness in the hands or feet Osteonecrosis of the jaw--pain, swelling, or redness in the mouth, numbness of the jaw, poor healing after dental work, unusual discharge from the mouth, visible bones in the mouth Pain or trouble swallowing Severe bone, joint, or muscle pain Stomach bleeding--bloody or black, tar-like stools, vomiting blood or brown material that looks like coffee grounds Side effects that usually do not require medical attention (report to your care team if they continue or are bothersome): Constipation Diarrhea Nausea Stomach pain This list may not describe all possible side effects. Call your doctor for medical advice about side effects. You may report side effects to FDA at 1-800-FDA-1088. Where should I keep my medication? Keep out of the reach of children and pets. Store at room temperature between 15 and 30 degrees C (59 and 86 degrees F). Throw away any unused medication after the expiration date. NOTE: This sheet is a summary. It may not cover all possible information. If you have questions about this medicine, talk to your doctor, pharmacist, or health care provider.  2024 Elsevier/Gold Standard (2020-06-20 00:00:00) Osteopenia  Osteopenia is a loss of thickness (density) inside the bones. Another name for osteopenia is low bone mass. Mild osteopenia is a normal part of aging. It is not a disease, and it does not cause symptoms. However, if you have osteopenia and continue to lose bone mass, you could  develop a condition that causes the bones to become thin and break more easily (osteoporosis). Osteoporosis can cause you to lose some height, have back pain, and have a stooped posture. Although osteopenia is not a disease, making changes to your lifestyle and diet can help to prevent osteopenia from developing into osteoporosis. What are the causes? Osteopenia is caused by loss of calcium in the bones. Bones are constantly changing. Old bone cells are continually being replaced with new bone cells. This process builds new bone. The mineral calcium is needed to build new bone and maintain bone density. Bone density is usually highest around age 24. After that, most people's bodies cannot replace all the bone they have lost with new bone. What increases the risk? You are more likely to develop this condition if: You are older than age 42. You are a woman who went through menopause early. You have a long illness that keeps you in bed. You do not get enough exercise. You lack certain nutrients (malnutrition). You have an overactive thyroid gland (hyperthyroidism). You use products that contain nicotine or tobacco, such as cigarettes, e-cigarettes and chewing tobacco, or you drink a lot of alcohol. You are taking medicines that weaken the bones, such as steroids. What are the signs or symptoms?  This condition does not cause any symptoms. You may have a slightly higher risk for bone breaks (fractures), so getting fractures more easily than normal may be an indication of osteopenia. How is this diagnosed? This condition may be diagnosed based on an X-ray exam that measures bone density (dual-energy X-ray absorptiometry, or DEXA). This test can measure bone density in your hips, spine, and wrists. Osteopenia has no symptoms, so this condition is usually diagnosed after a routine bone density screening test is done for osteoporosis. This routine screening is usually done for: Women who are age 57 or  older. Men who are age 49 or older. If you have risk factors for osteopenia, you may have the screening test at an earlier age. How is this treated? Making dietary and lifestyle changes can lower your risk for osteoporosis. If you have severe osteopenia that is close to becoming osteoporosis, this condition can be treated with medicines and dietary supplements such as calcium and vitamin D. These supplements help to rebuild bone density. Follow these instructions at home: Eating and drinking Eat a diet that is high in calcium and vitamin D. Calcium is found in dairy products, beans, salmon, and leafy green vegetables like spinach and broccoli. Look for foods that have vitamin D and calcium added to them (fortified foods), such as orange juice, cereal, and bread.  Lifestyle Do 30 minutes or more of a weight-bearing exercise every day, such as walking, jogging, or playing a sport. These types of exercises strengthen the bones. Do not use any products that contain nicotine or tobacco, such as cigarettes, e-cigarettes, and chewing tobacco. If you need help quitting, ask your health care provider. Do not drink alcohol if: Your health care provider tells you not to drink. You are pregnant, may be pregnant, or are planning to become pregnant. If you drink alcohol: Limit how much you use to: 0-1 drink a day for women. 0-2 drinks a day for men. Be aware of how much alcohol is in your drink. In the [place] one drink equals one 12 oz bottle of beer (355 mL), one 5 oz glass of wine (148 mL), or one 1 oz glass of hard liquor (44 mL). General instructions Take over-the-counter and prescription medicines only as told by your health care provider. These include vitamins and supplements. Take precautions at home to lower your risk of falling, such as: Keeping rooms well-lit and free of clutter, such as cords. Installing safety rails on stairs. Using rubber mats in the bathroom or other areas that are often  wet or slippery. Keep all follow-up visits. This is important. Contact a health care provider if: You have not had a bone density screening for osteoporosis and you are: A woman who is age 64 or older. A man who is age 22 or older. You are a postmenopausal woman who has not had a bone density screening for osteoporosis. You are older than age 82 and you want to know if you should have bone density screening for osteoporosis. Summary Osteopenia is a loss of thickness (density) inside the bones. Another name for osteopenia is low bone mass. Osteopenia is not a disease, but it may increase your risk for a condition that causes the bones to become thin and break more easily (osteoporosis). You may be at risk for osteopenia if you are older than age 63 or if you are a woman who went through early menopause. Osteopenia does not cause any symptoms, but it can be diagnosed with a bone  density screening test. Dietary and lifestyle changes are the first treatment for osteopenia. These may lower your risk for osteoporosis. This information is not intended to replace advice given to you by your health care provider. Make sure you discuss any questions you have with your health care provider. Document Revised: 11/23/2019 Document Reviewed: 11/23/2019 Elsevier Patient Education  2024 Elsevier Inc. Vaccines You are taking a medication(s) that can suppress your immune system.  The following immunizations are recommended: Flu annually Covid-19 RSV  Td/Tdap (tetanus, diphtheria, pertussis) every 10 years Pneumonia (Prevnar 15 then Pneumovax 23 at least 1 year apart.  Alternatively, can take Prevnar 20 without needing additional dose) Shingrix: 2 doses from 4 weeks to 6 months apart  Please check with your PCP to make sure you are up to date.

## 2023-05-02 NOTE — Progress Notes (Signed)
CBC and CMP are unremarkable, sed rate normal, vitamin D normal, TSH normal, PTH normal, SPEP normal, urine protein creatinine ratio normal, double-stranded DNA negative, C3-C4 normal, anticardiolipin and beta-2 GP 1 are pending.

## 2023-05-04 LAB — CBC WITH DIFFERENTIAL/PLATELET
Absolute Lymphocytes: 643 {cells}/uL — ABNORMAL LOW (ref 850–3900)
Absolute Monocytes: 505 {cells}/uL (ref 200–950)
Basophils Absolute: 31 {cells}/uL (ref 0–200)
Basophils Relative: 0.6 %
Eosinophils Absolute: 31 {cells}/uL (ref 15–500)
Eosinophils Relative: 0.6 %
HCT: 39.8 % (ref 35.0–45.0)
Hemoglobin: 13.4 g/dL (ref 11.7–15.5)
MCH: 31.8 pg (ref 27.0–33.0)
MCHC: 33.7 g/dL (ref 32.0–36.0)
MCV: 94.5 fL (ref 80.0–100.0)
MPV: 11.2 fL (ref 7.5–12.5)
Monocytes Relative: 9.9 %
Neutro Abs: 3891 {cells}/uL (ref 1500–7800)
Neutrophils Relative %: 76.3 %
Platelets: 227 10*3/uL (ref 140–400)
RBC: 4.21 10*6/uL (ref 3.80–5.10)
RDW: 12.3 % (ref 11.0–15.0)
Total Lymphocyte: 12.6 %
WBC: 5.1 10*3/uL (ref 3.8–10.8)

## 2023-05-04 LAB — COMPLETE METABOLIC PANEL WITH GFR
AG Ratio: 1.7 (calc) (ref 1.0–2.5)
ALT: 26 U/L (ref 6–29)
AST: 28 U/L (ref 10–35)
Albumin: 4.3 g/dL (ref 3.6–5.1)
Alkaline phosphatase (APISO): 65 U/L (ref 37–153)
BUN: 8 mg/dL (ref 7–25)
CO2: 28 mmol/L (ref 20–32)
Calcium: 9.1 mg/dL (ref 8.6–10.4)
Chloride: 99 mmol/L (ref 98–110)
Creat: 1.05 mg/dL (ref 0.50–1.05)
Globulin: 2.5 g/dL (ref 1.9–3.7)
Glucose, Bld: 98 mg/dL (ref 65–99)
Potassium: 4.1 mmol/L (ref 3.5–5.3)
Sodium: 134 mmol/L — ABNORMAL LOW (ref 135–146)
Total Bilirubin: 0.6 mg/dL (ref 0.2–1.2)
Total Protein: 6.8 g/dL (ref 6.1–8.1)
eGFR: 60 mL/min/{1.73_m2} (ref >=60)

## 2023-05-04 LAB — PHOSPHORUS: Phosphorus: 4.2 mg/dL (ref 2.5–4.5)

## 2023-05-04 LAB — PROTEIN ELECTROPHORESIS, SERUM, WITH REFLEX
Albumin ELP: 4.1 g/dL (ref 3.8–4.8)
Alpha 1: 0.3 g/dL (ref 0.2–0.3)
Alpha 2: 0.6 g/dL (ref 0.5–0.9)
Beta 2: 0.3 g/dL (ref 0.2–0.5)
Beta Globulin: 0.4 g/dL (ref 0.4–0.6)
Gamma Globulin: 0.9 g/dL (ref 0.8–1.7)
Total Protein: 6.6 g/dL (ref 6.1–8.1)

## 2023-05-04 LAB — C3 AND C4
C3 Complement: 128 mg/dL (ref 83–193)
C4 Complement: 21 mg/dL (ref 15–57)

## 2023-05-04 LAB — BETA-2 GLYCOPROTEIN ANTIBODIES
Beta-2 Glyco 1 IgA: 27.2 U/mL — ABNORMAL HIGH (ref <20.0)
Beta-2 Glyco 1 IgM: 33.5 U/mL — ABNORMAL HIGH (ref <20.0)
Beta-2 Glyco I IgG: 41.6 U/mL — ABNORMAL HIGH (ref <20.0)

## 2023-05-04 LAB — PROTEIN / CREATININE RATIO, URINE
Creatinine, Urine: 233 mg/dL (ref 20–275)
Protein/Creat Ratio: 94 mg/g{creat} (ref 24–184)
Protein/Creatinine Ratio: 0.094 mg/mg{creat} (ref 0.024–0.184)
Total Protein, Urine: 22 mg/dL (ref 5–24)

## 2023-05-04 LAB — CARDIOLIPIN ANTIBODIES, IGG, IGM, IGA
Anticardiolipin IgA: 27.1 [APL'U]/mL — ABNORMAL HIGH (ref <20.0)
Anticardiolipin IgG: 11.6 [GPL'U]/mL (ref <20.0)
Anticardiolipin IgM: 38.7 [MPL'U]/mL — ABNORMAL HIGH (ref <20.0)

## 2023-05-04 LAB — SEDIMENTATION RATE: Sed Rate: 6 mm/h (ref 0–30)

## 2023-05-04 LAB — PARATHYROID HORMONE, INTACT (NO CA): PTH: 76 pg/mL (ref 16–77)

## 2023-05-04 LAB — ANTI-DNA ANTIBODY, DOUBLE-STRANDED: ds DNA Ab: 2 [IU]/mL

## 2023-05-04 LAB — TSH: TSH: 0.8 m[IU]/L (ref 0.40–4.50)

## 2023-05-04 LAB — VITAMIN D 25 HYDROXY (VIT D DEFICIENCY, FRACTURES): Vit D, 25-Hydroxy: 37 ng/mL (ref 30–100)

## 2023-05-05 DIAGNOSIS — F4323 Adjustment disorder with mixed anxiety and depressed mood: Secondary | ICD-10-CM | POA: Diagnosis not present

## 2023-05-05 NOTE — Progress Notes (Signed)
Beta-2 GP 1 and anticardiolipin antibodies remain positive.  No change in treatment advised.

## 2023-06-10 DIAGNOSIS — R7303 Prediabetes: Secondary | ICD-10-CM | POA: Diagnosis not present

## 2023-06-10 DIAGNOSIS — M8580 Other specified disorders of bone density and structure, unspecified site: Secondary | ICD-10-CM | POA: Diagnosis not present

## 2023-06-10 DIAGNOSIS — F333 Major depressive disorder, recurrent, severe with psychotic symptoms: Secondary | ICD-10-CM | POA: Diagnosis not present

## 2023-06-10 DIAGNOSIS — I10 Essential (primary) hypertension: Secondary | ICD-10-CM | POA: Diagnosis not present

## 2023-06-10 DIAGNOSIS — Z9181 History of falling: Secondary | ICD-10-CM | POA: Diagnosis not present

## 2023-06-10 DIAGNOSIS — E559 Vitamin D deficiency, unspecified: Secondary | ICD-10-CM | POA: Diagnosis not present

## 2023-06-10 DIAGNOSIS — R946 Abnormal results of thyroid function studies: Secondary | ICD-10-CM | POA: Diagnosis not present

## 2023-06-10 DIAGNOSIS — Z0000 Encounter for general adult medical examination without abnormal findings: Secondary | ICD-10-CM | POA: Diagnosis not present

## 2023-06-10 DIAGNOSIS — Z23 Encounter for immunization: Secondary | ICD-10-CM | POA: Diagnosis not present

## 2023-06-10 DIAGNOSIS — Z136 Encounter for screening for cardiovascular disorders: Secondary | ICD-10-CM | POA: Diagnosis not present

## 2023-06-10 DIAGNOSIS — H9190 Unspecified hearing loss, unspecified ear: Secondary | ICD-10-CM | POA: Diagnosis not present

## 2023-06-17 DIAGNOSIS — Z1231 Encounter for screening mammogram for malignant neoplasm of breast: Secondary | ICD-10-CM | POA: Diagnosis not present

## 2023-06-28 ENCOUNTER — Other Ambulatory Visit: Payer: Self-pay | Admitting: *Deleted

## 2023-06-28 DIAGNOSIS — M3219 Other organ or system involvement in systemic lupus erythematosus: Secondary | ICD-10-CM

## 2023-06-28 MED ORDER — HYDROXYCHLOROQUINE SULFATE 200 MG PO TABS: ORAL_TABLET | ORAL | 0 refills | Status: DC

## 2023-06-28 NOTE — Telephone Encounter (Signed)
 Refill request received via fax from [facility]  for Plaquenil   Last Fill: 03/29/2023  Eye exam: 08/12/2022 WNL   Labs: 04/29/2023 CBC and CMP are unremarkable   Next Visit: 08/04/2023  Last Visit: 04/29/2023  DX: Other systemic lupus erythematosus with other organ involvement   Current Dose per office note 04/29/2023: Plaquenil  200 mg 1 tablet twice daily.   Okay to refill Plaquenil ?

## 2023-06-29 ENCOUNTER — Telehealth: Payer: Self-pay | Admitting: Physical Medicine and Rehabilitation

## 2023-06-29 NOTE — Telephone Encounter (Signed)
 Patient called and wants to make an appointment for Epidural shot in the back. CB#[phone]

## 2023-07-01 ENCOUNTER — Other Ambulatory Visit: Payer: Self-pay | Admitting: Physical Medicine and Rehabilitation

## 2023-07-01 DIAGNOSIS — M5116 Intervertebral disc disorders with radiculopathy, lumbar region: Secondary | ICD-10-CM

## 2023-07-01 DIAGNOSIS — G8929 Other chronic pain: Secondary | ICD-10-CM

## 2023-07-01 DIAGNOSIS — M5416 Radiculopathy, lumbar region: Secondary | ICD-10-CM

## 2023-07-19 ENCOUNTER — Other Ambulatory Visit: Payer: Self-pay | Admitting: Physical Medicine and Rehabilitation

## 2023-07-19 DIAGNOSIS — M5416 Radiculopathy, lumbar region: Secondary | ICD-10-CM
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
## 2023-07-20 ENCOUNTER — Encounter: Payer: HMO | Attending: Registered Nurse | Admitting: Registered Nurse

## 2023-07-20 ENCOUNTER — Encounter: Payer: Self-pay | Admitting: Registered Nurse

## 2023-07-20 VITALS — BP 135/82 | HR 78 | Ht 61.0 in | Wt 185.0 lb

## 2023-07-20 DIAGNOSIS — M545 Low back pain, unspecified: Secondary | ICD-10-CM | POA: Insufficient documentation

## 2023-07-20 DIAGNOSIS — G629 Polyneuropathy, unspecified: Secondary | ICD-10-CM | POA: Insufficient documentation

## 2023-07-20 DIAGNOSIS — G2401 Drug induced subacute dyskinesia: Secondary | ICD-10-CM | POA: Diagnosis not present

## 2023-07-20 DIAGNOSIS — G894 Chronic pain syndrome: Secondary | ICD-10-CM | POA: Insufficient documentation

## 2023-07-20 DIAGNOSIS — M7918 Myalgia, other site: Secondary | ICD-10-CM | POA: Diagnosis not present

## 2023-07-20 DIAGNOSIS — M25511 Pain in right shoulder: Secondary | ICD-10-CM | POA: Diagnosis not present

## 2023-07-20 DIAGNOSIS — G8929 Other chronic pain: Secondary | ICD-10-CM | POA: Diagnosis not present

## 2023-07-20 DIAGNOSIS — Z5181 Encounter for therapeutic drug level monitoring: Secondary | ICD-10-CM | POA: Diagnosis not present

## 2023-07-20 DIAGNOSIS — Z79891 Long term (current) use of opiate analgesic: Secondary | ICD-10-CM | POA: Diagnosis not present

## 2023-07-20 DIAGNOSIS — M7061 Trochanteric bursitis, right hip: Secondary | ICD-10-CM | POA: Diagnosis not present

## 2023-07-20 DIAGNOSIS — M797 Fibromyalgia: Secondary | ICD-10-CM | POA: Diagnosis not present
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
## 2023-07-22 ENCOUNTER — Telehealth: Payer: Self-pay | Admitting: Registered Nurse

## 2023-07-22 ENCOUNTER — Ambulatory Visit: Payer: HMO | Admitting: Physical Medicine and Rehabilitation

## 2023-07-22 ENCOUNTER — Other Ambulatory Visit: Payer: Self-pay

## 2023-07-22 DIAGNOSIS — M5416 Radiculopathy, lumbar region: Secondary | ICD-10-CM

## 2023-07-22 LAB — TOXASSURE SELECT,+ANTIDEPR,UR

## 2023-07-22 MED ORDER — METHYLPREDNISOLONE ACETATE 40 MG/ML IJ SUSP
40.0000 mg | Freq: Once | INTRAMUSCULAR | Status: AC
  Administered 2023-07-22: 40 mg

## 2023-07-22 NOTE — Progress Notes (Signed)
Terri Hurley Daily - 63 y.o. female MRN [id number]  Date of birth: 03/21/1961  Office Visit Note: Visit Date: 07/22/2023 PCP: Aliene Beams, MD Referred by: Aliene Beams, MD  Subjective: Chief Complaint  Patient presents with   Lower Back - Pain   HPI:  Terri Hurley is a 63 y.o. female who comes in today for planned repeat Bilateral S1-2  Lumbar Transforaminal epidural steroid injection with fluoroscopic guidance.  The patient has failed conservative care including home exercise, medications, time and activity modification.  This injection will be diagnostic and hopefully therapeutic.  Please see requesting physician notes for further details and justification. Patient received more than 50% pain relief from prior injection.   Referring: Ellin Goodie, FNP   ROS Otherwise per HPI.  Assessment & Plan: Visit Diagnoses:    ICD-10-CM   1. Lumbar radiculopathy  M54.16 XR C-ARM NO REPORT    Epidural Steroid injection    methylPREDNISolone acetate (DEPO-MEDROL) injection 40 mg      Plan: No additional findings.   Meds & Orders:  Meds ordered this encounter  Medications   methylPREDNISolone acetate (DEPO-MEDROL) injection 40 mg    Orders Placed This Encounter  Procedures   XR C-ARM NO REPORT   Epidural Steroid injection    Follow-up: Return for visit to requesting provider as needed.   Procedures: No procedures performed  S1 Lumbosacral Transforaminal Epidural Steroid Injection - Sub-Pedicular Approach with Fluoroscopic Guidance   Patient: Terri Hurley      Date of Birth: Oct 15, 1960 MRN: [id number] PCP: Aliene Beams, MD      Visit Date: 07/22/2023   Universal Protocol:    Date/Time: 01/30/251:43 PM  Consent Given By: the patient  Position:  PRONE  Additional Comments: Vital signs were monitored before and after the procedure. Patient was prepped and draped in the usual sterile fashion. The correct patient, procedure, and site was  verified.   Injection Procedure Details:  Procedure Site One Meds Administered:  Meds ordered this encounter  Medications   methylPREDNISolone acetate (DEPO-MEDROL) injection 40 mg    Laterality: Bilateral  Location/Site:  S1 Foramen   Needle size: 22 ga.  Needle type: Spinal  Needle Placement: Transforaminal  Findings:   -Comments: Excellent flow of contrast along the nerve, nerve root and into the epidural space.  Epidurogram: Contrast epidurogram showed no nerve root cut off or restricted flow pattern.  Procedure Details: After squaring off the sacral end-plate to get a true AP view, the C-arm was positioned so that the best possible view of the S1 foramen was visualized. The soft tissues overlying this structure were infiltrated with 2-3 ml. of 1% Lidocaine without Epinephrine.    The spinal needle was inserted toward the target using a "trajectory" view along the fluoroscope beam.  Under AP and lateral visualization, the needle was advanced so it did not puncture dura. Biplanar projections were used to confirm position. Aspiration was confirmed to be negative for CSF and/or blood. A 1-2 ml. volume of Isovue-250 was injected and flow of contrast was noted at each level. Radiographs were obtained for documentation purposes.   After attaining the desired flow of contrast documented above, a 0.5 to 1.0 ml test dose of 0.25% Marcaine was injected into each respective transforaminal space.  The patient was observed for 90 seconds post injection.  After no sensory deficits were reported, and normal lower extremity motor function was noted,   the above injectate was administered so that equal amounts of the injectate  were placed at each foramen (level) into the transforaminal epidural space.   Additional Comments:  The patient tolerated the procedure well Dressing: Band-Aid with 2 x 2 sterile gauze    Post-procedure details: Patient was observed during the  procedure. Post-procedure instructions were reviewed.  Patient left the clinic in stable condition.   Clinical History: EXAM: MRI LUMBAR SPINE WITHOUT CONTRAST  TECHNIQUE: Multiplanar, multisequence MR imaging of the lumbar spine was performed. No intravenous contrast was administered.  COMPARISON: Lumbar spine MRI 03/29/2019  FINDINGS: Segmentation: Standard.  Alignment: Mild lumbar dextroscoliosis. No listhesis.  Vertebrae: No fracture, suspicious marrow lesion, or significant marrow edema.  Conus medullaris and cauda equina: Conus extends to the L1 level. Conus and cauda equina appear normal.  Paraspinal and other soft tissues: Unremarkable.  Disc levels:  Mild disc desiccation and up to mild disc space narrowing throughout the lumbar spine.  L1-2: Mild disc bulging eccentric to the left, endplate spurring, and slight facet hypertrophy result in borderline to mild left neural foraminal stenosis without spinal stenosis, unchanged.  L2-3: Disc bulging eccentric to the right, endplate spurring, and mild facet hypertrophy result in mild right neural foraminal stenosis without spinal stenosis, unchanged.  L3-4: Mild disc bulging, endplate spurring, and mild facet hypertrophy result in borderline left neural foraminal stenosis without spinal stenosis, unchanged.  L4-5: Disc bulging, endplate spurring, and mild facet hypertrophy result in borderline to mild bilateral neural foraminal stenosis without spinal stenosis, unchanged.  L5-S1: The central disc extrusion on the prior study has regressed. There is a shallow residual central disc protrusion with annular fissure in close proximity to the left greater than right S1 nerve roots without evidence of neural compression or significant stenosis. A 6 mm focus of soft tissue in the left lateral recess inferior to the disc space presumably reflects a residual disc fragment, overall smaller than on the prior study though  potentially irritating the left S1 nerve root. Mild facet hypertrophy. Patent neural foramina.  IMPRESSION: 1. Regression of L5-S1 disc extrusion. Persistent small residual sequestered disc fragment in the left lateral recess potentially affecting the left S1 nerve root. 2. Unchanged disc and facet degeneration elsewhere with up to mild neural foraminal stenosis. No spinal stenosis.   Electronically Signed By: Sebastian Ache M.D. On: 05/15/2021 19:20     Objective:  VS:  HT:    WT:   BMI:     BP:   HR: bpm  TEMP: ( )  RESP:  Physical Exam Vitals and nursing note reviewed.  Constitutional:      General: She is not in acute distress.    Appearance: Normal appearance. She is not ill-appearing.  HENT:     Head: Normocephalic and atraumatic.     Right Ear: External ear normal.     Left Ear: External ear normal.  Eyes:     Extraocular Movements: Extraocular movements intact.  Cardiovascular:     Rate and Rhythm: Normal rate.     Pulses: Normal pulses.  Pulmonary:     Effort: Pulmonary effort is normal. No respiratory distress.  Abdominal:     General: There is no distension.     Palpations: Abdomen is soft.  Musculoskeletal:        General: Tenderness present.     Cervical back: Neck supple.     Right lower leg: No edema.     Left lower leg: No edema.     Comments: Patient has good distal strength with no pain over the greater trochanters.  No clonus or focal weakness.  Skin:    Findings: No erythema, lesion or rash.  Neurological:     General: No focal deficit present.     Mental Status: She is alert and oriented to person, place, and time.     Sensory: No sensory deficit.     Motor: No weakness or abnormal muscle tone.     Coordination: Coordination normal.  Psychiatric:        Mood and Affect: Mood normal.        Behavior: Behavior normal.      Imaging: No results found.

## 2023-07-22 NOTE — Telephone Encounter (Signed)
Sybil,  Can you review UDS+ Hydrocodone  Read the UDS Notes.  Hydrocodone level 42?

## 2023-07-22 NOTE — Progress Notes (Signed)
Functional Pain Scale - descriptive words and definitions  Uncomfortable (3)  Pain is present but can complete all ADL's/sleep is slightly affected and passive distraction only gives marginal relief. Mild range order  Average Pain 6 O numbness / tingling.   +Driver, -BT, -Dye Allergies.

## 2023-07-22 NOTE — Procedures (Signed)
S1 Lumbosacral Transforaminal Epidural Steroid Injection - Sub-Pedicular Approach with Fluoroscopic Guidance   Patient: Terri Hurley      Date of Birth: Nov 09, 1960 MRN: [id number] PCP: Aliene Beams, MD      Visit Date: 07/22/2023   Universal Protocol:    Date/Time: 01/30/251:43 PM  Consent Given By: the patient  Position:  PRONE  Additional Comments: Vital signs were monitored before and after the procedure. Patient was prepped and draped in the usual sterile fashion. The correct patient, procedure, and site was verified.   Injection Procedure Details:  Procedure Site One Meds Administered:  Meds ordered this encounter  Medications   methylPREDNISolone acetate (DEPO-MEDROL) injection 40 mg    Laterality: Bilateral  Location/Site:  S1 Foramen   Needle size: 22 ga.  Needle type: Spinal  Needle Placement: Transforaminal  Findings:   -Comments: Excellent flow of contrast along the nerve, nerve root and into the epidural space.  Epidurogram: Contrast epidurogram showed no nerve root cut off or restricted flow pattern.  Procedure Details: After squaring off the sacral end-plate to get a true AP view, the C-arm was positioned so that the best possible view of the S1 foramen was visualized. The soft tissues overlying this structure were infiltrated with 2-3 ml. of 1% Lidocaine without Epinephrine.    The spinal needle was inserted toward the target using a "trajectory" view along the fluoroscope beam.  Under AP and lateral visualization, the needle was advanced so it did not puncture dura. Biplanar projections were used to confirm position. Aspiration was confirmed to be negative for CSF and/or blood. A 1-2 ml. volume of Isovue-250 was injected and flow of contrast was noted at each level. Radiographs were obtained for documentation purposes.   After attaining the desired flow of contrast documented above, a 0.5 to 1.0 ml test dose of 0.25% Marcaine was injected into  each respective transforaminal space.  The patient was observed for 90 seconds post injection.  After no sensory deficits were reported, and normal lower extremity motor function was noted,   the above injectate was administered so that equal amounts of the injectate were placed at each foramen (level) into the transforaminal epidural space.   Additional Comments:  The patient tolerated the procedure well Dressing: Band-Aid with 2 x 2 sterile gauze    Post-procedure details: Patient was observed during the procedure. Post-procedure instructions were reviewed.  Patient left the clinic in stable condition.

## 2023-07-23 NOTE — Telephone Encounter (Signed)
Hydrocodone is a metabolite of Codeine.

## 2023-08-04 ENCOUNTER — Ambulatory Visit: Payer: HMO | Attending: Physician Assistant | Admitting: Physician Assistant

## 2023-08-04 ENCOUNTER — Encounter: Payer: Self-pay | Admitting: Physician Assistant

## 2023-08-04 VITALS — BP 130/89 | HR 109 | Resp 15 | Ht 61.0 in | Wt 185.4 lb

## 2023-08-04 DIAGNOSIS — Z8269 Family history of other diseases of the musculoskeletal system and connective tissue: Secondary | ICD-10-CM

## 2023-08-04 DIAGNOSIS — M8589 Other specified disorders of bone density and structure, multiple sites: Secondary | ICD-10-CM

## 2023-08-04 DIAGNOSIS — I10 Essential (primary) hypertension: Secondary | ICD-10-CM

## 2023-08-04 DIAGNOSIS — M19042 Primary osteoarthritis, left hand: Secondary | ICD-10-CM

## 2023-08-04 DIAGNOSIS — K760 Fatty (change of) liver, not elsewhere classified: Secondary | ICD-10-CM

## 2023-08-04 DIAGNOSIS — Z79899 Other long term (current) drug therapy: Secondary | ICD-10-CM | POA: Diagnosis not present

## 2023-08-04 DIAGNOSIS — M5030 Other cervical disc degeneration, unspecified cervical region: Secondary | ICD-10-CM

## 2023-08-04 DIAGNOSIS — R748 Abnormal levels of other serum enzymes: Secondary | ICD-10-CM

## 2023-08-04 DIAGNOSIS — M3219 Other organ or system involvement in systemic lupus erythematosus: Secondary | ICD-10-CM | POA: Diagnosis not present

## 2023-08-04 DIAGNOSIS — G252 Other specified forms of tremor: Secondary | ICD-10-CM

## 2023-08-04 DIAGNOSIS — E559 Vitamin D deficiency, unspecified: Secondary | ICD-10-CM

## 2023-08-04 DIAGNOSIS — R760 Raised antibody titer: Secondary | ICD-10-CM | POA: Diagnosis not present

## 2023-08-04 DIAGNOSIS — M797 Fibromyalgia: Secondary | ICD-10-CM

## 2023-08-04 DIAGNOSIS — R5383 Other fatigue: Secondary | ICD-10-CM

## 2023-08-04 DIAGNOSIS — F5101 Primary insomnia: Secondary | ICD-10-CM

## 2023-08-04 DIAGNOSIS — M47816 Spondylosis without myelopathy or radiculopathy, lumbar region: Secondary | ICD-10-CM

## 2023-08-04 DIAGNOSIS — K219 Gastro-esophageal reflux disease without esophagitis: Secondary | ICD-10-CM

## 2023-08-04 DIAGNOSIS — R21 Rash and other nonspecific skin eruption: Secondary | ICD-10-CM | POA: Diagnosis not present

## 2023-08-04 DIAGNOSIS — R296 Repeated falls: Secondary | ICD-10-CM

## 2023-08-04 DIAGNOSIS — M19041 Primary osteoarthritis, right hand: Secondary | ICD-10-CM

## 2023-08-04 NOTE — Progress Notes (Signed)
Absolute lymphocytes remain borderline low but has improved. Rest of CBC WNL.

## 2023-08-05 NOTE — Progress Notes (Signed)
Glucose is 131. Sodium remains borderline low but stable. Rest of cmp wnl.  ESR WNL Urine protein creatinine ratio WNL  Complements WNL

## 2023-08-06 NOTE — Progress Notes (Signed)
dsDNA is negative

## 2023-08-08 LAB — CBC WITH DIFFERENTIAL/PLATELET
Absolute Lymphocytes: 658 {cells}/uL — ABNORMAL LOW (ref 850–3900)
Absolute Monocytes: 466 {cells}/uL (ref 200–950)
Basophils Absolute: 29 {cells}/uL (ref 0–200)
Basophils Relative: 0.6 %
Eosinophils Absolute: 19 {cells}/uL (ref 15–500)
Eosinophils Relative: 0.4 %
HCT: 37.9 % (ref 35.0–45.0)
Hemoglobin: 12.8 g/dL (ref 11.7–15.5)
MCH: 31.8 pg (ref 27.0–33.0)
MCHC: 33.8 g/dL (ref 32.0–36.0)
MCV: 94 fL (ref 80.0–100.0)
MPV: 10.4 fL (ref 7.5–12.5)
Monocytes Relative: 9.7 %
Neutro Abs: 3629 {cells}/uL (ref 1500–7800)
Neutrophils Relative %: 75.6 %
Platelets: 289 10*3/uL (ref 140–400)
RBC: 4.03 10*6/uL (ref 3.80–5.10)
RDW: 12.2 % (ref 11.0–15.0)
Total Lymphocyte: 13.7 %
WBC: 4.8 10*3/uL (ref 3.8–10.8)

## 2023-08-08 LAB — ANTI-DNA ANTIBODY, DOUBLE-STRANDED: ds DNA Ab: 2 [IU]/mL

## 2023-08-08 LAB — BETA-2 GLYCOPROTEIN ANTIBODIES
Beta-2 Glyco 1 IgA: 26.9 U/mL — ABNORMAL HIGH (ref <20.0)
Beta-2 Glyco 1 IgM: 33 U/mL — ABNORMAL HIGH (ref <20.0)
Beta-2 Glyco I IgG: 31.6 U/mL — ABNORMAL HIGH (ref <20.0)

## 2023-08-08 LAB — COMPLETE METABOLIC PANEL WITH GFR
AG Ratio: 2.1 (calc) (ref 1.0–2.5)
ALT: 19 U/L (ref 6–29)
AST: 20 U/L (ref 10–35)
Albumin: 4.1 g/dL (ref 3.6–5.1)
Alkaline phosphatase (APISO): 54 U/L (ref 37–153)
BUN: 8 mg/dL (ref 7–25)
CO2: 27 mmol/L (ref 20–32)
Calcium: 8.9 mg/dL (ref 8.6–10.4)
Chloride: 99 mmol/L (ref 98–110)
Creat: 1.02 mg/dL (ref 0.50–1.05)
Globulin: 2 g/dL (ref 1.9–3.7)
Glucose, Bld: 131 mg/dL — ABNORMAL HIGH (ref 65–99)
Potassium: 3.7 mmol/L (ref 3.5–5.3)
Sodium: 134 mmol/L — ABNORMAL LOW (ref 135–146)
Total Bilirubin: 0.5 mg/dL (ref 0.2–1.2)
Total Protein: 6.1 g/dL (ref 6.1–8.1)
eGFR: 62 mL/min/{1.73_m2} (ref >=60)

## 2023-08-08 LAB — C3 AND C4
C3 Complement: 117 mg/dL (ref 83–193)
C4 Complement: 17 mg/dL (ref 15–57)

## 2023-08-08 LAB — PROTEIN / CREATININE RATIO, URINE
Creatinine, Urine: 138 mg/dL (ref 20–275)
Protein/Creat Ratio: 101 mg/g{creat} (ref 24–184)
Protein/Creatinine Ratio: 0.101 mg/mg{creat} (ref 0.024–0.184)
Total Protein, Urine: 14 mg/dL (ref 5–24)

## 2023-08-08 LAB — CARDIOLIPIN ANTIBODIES, IGG, IGM, IGA
Anticardiolipin IgA: 22.8 [APL'U]/mL — ABNORMAL HIGH (ref <20.0)
Anticardiolipin IgG: 12.1 [GPL'U]/mL (ref <20.0)
Anticardiolipin IgM: 36.1 [MPL'U]/mL — ABNORMAL HIGH (ref <20.0)

## 2023-08-08 LAB — SEDIMENTATION RATE: Sed Rate: 11 mm/h (ref 0–30)

## 2023-08-08 NOTE — Progress Notes (Signed)
 Beta-2 glycoprotein antibodies remain positive-stable.  Anticardiolipin IgA and IgM remain positive-stable.  No change in therapy recommended at this time.

## 2023-08-25 DIAGNOSIS — F4323 Adjustment disorder with mixed anxiety and depressed mood: Secondary | ICD-10-CM | POA: Diagnosis not present

## 2023-09-08 DIAGNOSIS — F4323 Adjustment disorder with mixed anxiety and depressed mood: Secondary | ICD-10-CM | POA: Diagnosis not present

## 2023-09-27 ENCOUNTER — Other Ambulatory Visit: Payer: Self-pay | Admitting: Physician Assistant

## 2023-09-27 DIAGNOSIS — M3219 Other organ or system involvement in systemic lupus erythematosus: Secondary | ICD-10-CM

## 2023-09-27 NOTE — Telephone Encounter (Signed)
 Last Fill: 06/28/2023  Eye exam: 08/18/2023 WNL    Labs: 08/04/2023 Glucose is 131. Sodium remains borderline low but stable. Rest of cmp wnl. ESR WNL  Urine protein creatinine ratio WNL  Complements WNL  Absolute lymphocytes remain borderline low but has improved. Rest of CBC WNL.   Next Visit: 01/13/2024  Last Visit: 08/04/2023  ZO:[id number] systemic lupus erythematosus with other organ involvement   Current Dose per office note 08/04/2023: Plaquenil 200 mg 1 tablet twice daily.   Okay to refill Plaquenil?

## 2023-10-12 DIAGNOSIS — F4323 Adjustment disorder with mixed anxiety and depressed mood: Secondary | ICD-10-CM | POA: Diagnosis not present

## 2023-10-12 DIAGNOSIS — R0781 Pleurodynia: Secondary | ICD-10-CM | POA: Diagnosis not present

## 2023-10-12 DIAGNOSIS — W19XXXA Unspecified fall, initial encounter: Secondary | ICD-10-CM | POA: Diagnosis not present

## 2023-10-12 DIAGNOSIS — T07XXXA Unspecified multiple injuries, initial encounter: Secondary | ICD-10-CM | POA: Diagnosis not present

## 2023-10-15 DIAGNOSIS — N182 Chronic kidney disease, stage 2 (mild): Secondary | ICD-10-CM | POA: Diagnosis not present

## 2023-10-15 DIAGNOSIS — R760 Raised antibody titer: Secondary | ICD-10-CM | POA: Diagnosis not present

## 2023-10-15 DIAGNOSIS — M329 Systemic lupus erythematosus, unspecified: Secondary | ICD-10-CM | POA: Diagnosis not present

## 2023-10-15 DIAGNOSIS — G894 Chronic pain syndrome: Secondary | ICD-10-CM | POA: Diagnosis not present

## 2023-10-15 DIAGNOSIS — I10 Essential (primary) hypertension: Secondary | ICD-10-CM | POA: Diagnosis not present

## 2023-10-18 ENCOUNTER — Encounter: Payer: Self-pay | Admitting: Physical Medicine and Rehabilitation

## 2023-10-18 ENCOUNTER — Encounter: Payer: HMO | Attending: Registered Nurse | Admitting: Physical Medicine and Rehabilitation

## 2023-10-18 VITALS — BP 149/94 | HR 75 | Ht 61.0 in | Wt 184.0 lb

## 2023-10-18 DIAGNOSIS — M797 Fibromyalgia: Secondary | ICD-10-CM | POA: Diagnosis not present

## 2023-10-18 DIAGNOSIS — M7061 Trochanteric bursitis, right hip: Secondary | ICD-10-CM | POA: Diagnosis not present

## 2023-10-18 DIAGNOSIS — X58XXXD Exposure to other specified factors, subsequent encounter: Secondary | ICD-10-CM | POA: Insufficient documentation

## 2023-10-18 DIAGNOSIS — Z79899 Other long term (current) drug therapy: Secondary | ICD-10-CM | POA: Diagnosis not present

## 2023-10-18 DIAGNOSIS — G8929 Other chronic pain: Secondary | ICD-10-CM | POA: Diagnosis not present

## 2023-10-18 DIAGNOSIS — M329 Systemic lupus erythematosus, unspecified: Secondary | ICD-10-CM | POA: Diagnosis not present

## 2023-10-18 DIAGNOSIS — M51362 Other intervertebral disc degeneration, lumbar region with discogenic back pain and lower extremity pain: Secondary | ICD-10-CM | POA: Insufficient documentation

## 2023-10-18 DIAGNOSIS — G894 Chronic pain syndrome: Secondary | ICD-10-CM | POA: Diagnosis not present

## 2023-10-18 MED ORDER — TRIAMCINOLONE ACETONIDE 40 MG/ML IJ SUSP
40.0000 mg | Freq: Once | INTRAMUSCULAR | Status: AC
  Administered 2023-10-18: 40 mg via INTRAMUSCULAR

## 2023-10-18 MED ORDER — LIDOCAINE HCL 1 % IJ SOLN
1.0000 mL | Freq: Once | INTRAMUSCULAR | Status: AC
Start: 2023-10-18 — End: 2023-10-18
  Administered 2023-10-18: 1 mL

## 2023-10-18 MED ORDER — TRAMADOL HCL 50 MG PO TABS: 50.0000 mg | ORAL_TABLET | Freq: Four times a day (QID) | ORAL | 5 refills | Status: DC | PRN

## 2023-10-18 MED ORDER — PREGABALIN 100 MG PO CAPS: 100.0000 mg | ORAL_CAPSULE | Freq: Two times a day (BID) | ORAL | 1 refills | Status: DC

## 2023-10-18 MED ORDER — GABAPENTIN 400 MG PO CAPS: ORAL_CAPSULE | ORAL | 1 refills | Status: DC

## 2023-10-18 MED ORDER — ACETAMINOPHEN-CODEINE 300-30 MG PO TABS: 1.0000 | ORAL_TABLET | Freq: Four times a day (QID) | ORAL | 5 refills | Status: DC | PRN
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
## 2023-10-24 ENCOUNTER — Other Ambulatory Visit: Payer: Self-pay | Admitting: Rheumatology

## 2023-10-24 DIAGNOSIS — M8589 Other specified disorders of bone density and structure, multiple sites: Secondary | ICD-10-CM

## 2023-10-25 NOTE — Telephone Encounter (Signed)
 Last Fill: 04/29/2023  Labs: 08/04/2023 Glucose is 131. Sodium remains borderline low but stable. Rest of cmp wnl. Absolute lymphocytes remain borderline low but has improved. Rest of CBC WNL.   Next Visit: 01/13/2024  Last Visit: 08/04/2023  DX: Osteopenia of multiple sites   Current Dose per office note 08/04/2023: Patient was initiated on Fosamax  after her last office visit on 04/29/2023   Okay to refill Fosamax ?

## 2023-10-26 DIAGNOSIS — F411 Generalized anxiety disorder: Secondary | ICD-10-CM | POA: Diagnosis not present

## 2023-11-03 ENCOUNTER — Ambulatory Visit: Attending: Family Medicine | Admitting: Audiologist

## 2023-11-03 DIAGNOSIS — H903 Sensorineural hearing loss, bilateral: Secondary | ICD-10-CM | POA: Insufficient documentation

## 2023-11-03 NOTE — Procedures (Signed)
  [facility] and [facility] [address] [place], [place] [address] [phone]  AUDIOLOGICAL EVALUATION  Name: Terri Hurley    Status: Outpatient DOB: [birth year]    Referent: Dorena Gander, MD MRN: [id number] Date: 11/03/2023     Diagnosis: Sensorineural hearing loss, bilateral   HISTORY: Terri Hurley, 63 y.o., was seen for an audiological evaluation.   Terri Hurley notices that she is not hearing others well, and friends and family have reported that she is not hearing them and needs the TV turned up too loud.  Terri Hurley notes no ear pain, pressure, or fullness.  Terri Hurley reports brief intermittent tinnitus, bilaterally. There is family history of hearing loss, with her parents and siblings developing hearing loss. She notes no history of ear surgery or ear infections.          EVALUATION: Otoscopic inspection reveals clear ear canals with visible tympanic membranes.   Tympanometry was completed to assess middle ear status. A normal, Type A tympanogram was obtained in the right ear and a shallow Type As tympanogram was obtained in the left ear. Terri Hurley has a tremor that can be seen on the jitter in the tympanogram tracing.  Standard audiometric techniques were used to obtain thresholds under high frequency headphones. Speech reception thresholds are 20 dBHL on the left and 15 dBHL on the right using recorded spondee word lists. Word recognition was 100% at 60 dBHL on the left at and 92% at 60 dBHL on the right using recorded NU-6 word lists, in quiet. A mild sloping to moderate high frequency sensorineural hearing loss is present bilaterally.   CONCLUSION:  Terri Hurley has a mild sloping to moderate high frequency sensorineural hearing loss bilaterally.  Word recognition is good in quiet at conversational speech levels bilaterally. Findings were reviewed with the patient.   RECOMMENDATIONS: Repeat hearing evaluation in 1-2 years or sooner if a change in hearing or tinnitus is  noted. Use of binaural amplification to assist in daily listening situations. A list of area hearing aid providers was given to the patient. Use of communication strategies to improve communication in difficult listening situations.  Audiogram scanned in under media tab.  Burgess Caroline, Au.D., CCC-A Audiologist 11/03/2023  cc: Dorena Gander, MD

## 2023-11-16 DIAGNOSIS — I10 Essential (primary) hypertension: Secondary | ICD-10-CM | POA: Diagnosis not present

## 2023-11-17 ENCOUNTER — Telehealth: Payer: Self-pay | Admitting: Physical Medicine and Rehabilitation
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
## 2023-11-17 NOTE — Telephone Encounter (Signed)
 Pt called requesting a back injection. Please call pt at [phone].

## 2023-11-26 ENCOUNTER — Ambulatory Visit: Admitting: Physical Medicine and Rehabilitation

## 2023-11-26 ENCOUNTER — Encounter: Payer: Self-pay | Admitting: Physical Medicine and Rehabilitation

## 2023-11-26 DIAGNOSIS — M5441 Lumbago with sciatica, right side: Secondary | ICD-10-CM

## 2023-11-26 DIAGNOSIS — G894 Chronic pain syndrome: Secondary | ICD-10-CM | POA: Diagnosis not present

## 2023-11-26 DIAGNOSIS — M5442 Lumbago with sciatica, left side: Secondary | ICD-10-CM

## 2023-11-26 DIAGNOSIS — M5116 Intervertebral disc disorders with radiculopathy, lumbar region: Secondary | ICD-10-CM

## 2023-11-26 DIAGNOSIS — M5416 Radiculopathy, lumbar region: Secondary | ICD-10-CM

## 2023-11-26 DIAGNOSIS — M797 Fibromyalgia: Secondary | ICD-10-CM

## 2023-11-26 DIAGNOSIS — G8929 Other chronic pain: Secondary | ICD-10-CM

## 2023-11-26 NOTE — Progress Notes (Signed)
 Pain Scale   Average Pain 0 Patient advising she has Chronic lower back pain...        +Driver, -BT, -Dye Allergies.
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
## 2023-12-09 DIAGNOSIS — R809 Proteinuria, unspecified: Secondary | ICD-10-CM | POA: Diagnosis not present

## 2023-12-09 DIAGNOSIS — Z23 Encounter for immunization: Secondary | ICD-10-CM | POA: Diagnosis not present

## 2023-12-09 DIAGNOSIS — I10 Essential (primary) hypertension: Secondary | ICD-10-CM | POA: Diagnosis not present

## 2023-12-09 DIAGNOSIS — R7303 Prediabetes: Secondary | ICD-10-CM | POA: Diagnosis not present

## 2023-12-09 DIAGNOSIS — Z7185 Encounter for immunization safety counseling: Secondary | ICD-10-CM | POA: Diagnosis not present

## 2023-12-09 DIAGNOSIS — E559 Vitamin D deficiency, unspecified: Secondary | ICD-10-CM | POA: Diagnosis not present

## 2023-12-09 DIAGNOSIS — E7800 Pure hypercholesterolemia, unspecified: Secondary | ICD-10-CM | POA: Diagnosis not present

## 2023-12-15 DIAGNOSIS — E871 Hypo-osmolality and hyponatremia: Secondary | ICD-10-CM | POA: Diagnosis not present

## 2023-12-16 ENCOUNTER — Encounter: Attending: Registered Nurse | Admitting: Registered Nurse

## 2023-12-19 DIAGNOSIS — I10 Essential (primary) hypertension: Secondary | ICD-10-CM | POA: Diagnosis not present

## 2023-12-20 DIAGNOSIS — I10 Essential (primary) hypertension: Secondary | ICD-10-CM | POA: Diagnosis not present

## 2023-12-20 DIAGNOSIS — F331 Major depressive disorder, recurrent, moderate: Secondary | ICD-10-CM | POA: Diagnosis not present

## 2023-12-20 DIAGNOSIS — F333 Major depressive disorder, recurrent, severe with psychotic symptoms: Secondary | ICD-10-CM | POA: Diagnosis not present

## 2023-12-21 ENCOUNTER — Encounter: Attending: Registered Nurse | Admitting: Registered Nurse

## 2023-12-21 ENCOUNTER — Encounter: Payer: Self-pay | Admitting: Registered Nurse

## 2023-12-21 ENCOUNTER — Ambulatory Visit: Admitting: Physical Medicine and Rehabilitation

## 2023-12-21 ENCOUNTER — Other Ambulatory Visit: Payer: Self-pay

## 2023-12-21 VITALS — BP 127/77 | HR 73 | Ht 61.0 in | Wt 179.4 lb

## 2023-12-21 VITALS — BP 145/84 | HR 87

## 2023-12-21 DIAGNOSIS — M25512 Pain in left shoulder: Secondary | ICD-10-CM | POA: Diagnosis not present

## 2023-12-21 DIAGNOSIS — Z5181 Encounter for therapeutic drug level monitoring: Secondary | ICD-10-CM | POA: Insufficient documentation

## 2023-12-21 DIAGNOSIS — G894 Chronic pain syndrome: Secondary | ICD-10-CM | POA: Diagnosis not present

## 2023-12-21 DIAGNOSIS — M5416 Radiculopathy, lumbar region: Secondary | ICD-10-CM | POA: Diagnosis not present

## 2023-12-21 DIAGNOSIS — G629 Polyneuropathy, unspecified: Secondary | ICD-10-CM | POA: Diagnosis not present

## 2023-12-21 DIAGNOSIS — Z79891 Long term (current) use of opiate analgesic: Secondary | ICD-10-CM | POA: Insufficient documentation

## 2023-12-21 DIAGNOSIS — M7062 Trochanteric bursitis, left hip: Secondary | ICD-10-CM | POA: Insufficient documentation

## 2023-12-21 DIAGNOSIS — M545 Low back pain, unspecified: Secondary | ICD-10-CM | POA: Insufficient documentation

## 2023-12-21 DIAGNOSIS — M25511 Pain in right shoulder: Secondary | ICD-10-CM | POA: Insufficient documentation

## 2023-12-21 DIAGNOSIS — G8929 Other chronic pain: Secondary | ICD-10-CM | POA: Insufficient documentation

## 2023-12-21 MED ORDER — METHYLPREDNISOLONE ACETATE 40 MG/ML IJ SUSP
40.0000 mg | Freq: Once | INTRAMUSCULAR | Status: AC
  Administered 2023-12-21: 40 mg

## 2023-12-21 NOTE — Patient Instructions (Signed)

## 2023-12-21 NOTE — Progress Notes (Signed)
 Pain Scale   Average Pain 5 Patient advising that she has chronic lower back pain that increases when standing of walking. Patient advising that she has to lay down to get any relief.        +Driver, -BT, -Dye Allergies.
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
## 2023-12-21 NOTE — Procedures (Signed)
 S1 Lumbosacral Transforaminal Epidural Steroid Injection - Sub-Pedicular Approach with Fluoroscopic Guidance   Patient: Terri Hurley      Date of Birth: 10/19/1960 MRN: [id number] PCP: Rolinda Millman, MD      Visit Date: 12/21/2023   Universal Protocol:    Date/Time: 07/01/252:21 PM  Consent Given By: the patient  Position:  PRONE  Additional Comments: Vital signs were monitored before and after the procedure. Patient was prepped and draped in the usual sterile fashion. The correct patient, procedure, and site was verified.   Injection Procedure Details:  Procedure Site One Meds Administered:  Meds ordered this encounter  Medications   methylPREDNISolone  acetate (DEPO-MEDROL ) injection 40 mg    Laterality: Bilateral  Location/Site:  S1 Foramen   Needle size: 22 ga.  Needle type: Spinal  Needle Placement: Transforaminal  Findings:   -Comments: Excellent flow of contrast along the nerve, nerve root and into the epidural space.  Epidurogram: Contrast epidurogram showed no nerve root cut off or restricted flow pattern.  Procedure Details: After squaring off the sacral end-plate to get a true AP view, the C-arm was positioned so that the best possible view of the S1 foramen was visualized. The soft tissues overlying this structure were infiltrated with 2-3 ml. of 1% Lidocaine  without Epinephrine .    The spinal needle was inserted toward the target using a trajectory view along the fluoroscope beam.  Under AP and lateral visualization, the needle was advanced so it did not puncture dura. Biplanar projections were used to confirm position. Aspiration was confirmed to be negative for CSF and/or blood. A 1-2 ml. volume of Isovue-250 was injected and flow of contrast was noted at each level. Radiographs were obtained for documentation purposes.   After attaining the desired flow of contrast documented above, a 0.5 to 1.0 ml test dose of 0.25% Marcaine was injected into  each respective transforaminal space.  The patient was observed for 90 seconds post injection.  After no sensory deficits were reported, and normal lower extremity motor function was noted,   the above injectate was administered so that equal amounts of the injectate were placed at each foramen (level) into the transforaminal epidural space.   Additional Comments:  The patient tolerated the procedure well Dressing: Band-Aid with 2 x 2 sterile gauze    Post-procedure details: Patient was observed during the procedure. Post-procedure instructions were reviewed.  Patient left the clinic in stable condition.

## 2023-12-21 NOTE — Progress Notes (Signed)
 Terri Hurley - 63 y.o. female MRN [id number]  Date of birth: Nov 21, 1960  Office Visit Note: Visit Date: 12/21/2023 PCP: Rolinda Millman, MD Referred by: Rolinda Millman, MD  Subjective: Chief Complaint  Patient presents with   Lower Back - Pain   HPI:  Terri Hurley is a 63 y.o. female who comes in today for planned repeat Bilateral S1-2  Lumbar Transforaminal epidural steroid injection with fluoroscopic guidance.  The patient has failed conservative care including home exercise, medications, time and activity modification.  This injection will be diagnostic and hopefully therapeutic.  Please see requesting physician notes for further details and justification. Patient received more than 50% pain relief from prior injection.   Referring: Duwaine Pouch, FNP   ROS Otherwise per HPI.  Assessment & Plan: Visit Diagnoses:    ICD-10-CM   1. Lumbar radiculopathy  M54.16 XR C-ARM NO REPORT    Epidural Steroid injection    methylPREDNISolone  acetate (DEPO-MEDROL ) injection 40 mg      Plan: No additional findings.   Meds & Orders:  Meds ordered this encounter  Medications   methylPREDNISolone  acetate (DEPO-MEDROL ) injection 40 mg    Orders Placed This Encounter  Procedures   XR C-ARM NO REPORT   Epidural Steroid injection    Follow-up: Return for visit to requesting provider as needed.   Procedures: No procedures performed  S1 Lumbosacral Transforaminal Epidural Steroid Injection - Sub-Pedicular Approach with Fluoroscopic Guidance   Patient: Terri Hurley      Date of Birth: [birth year] MRN: [id number] PCP: Rolinda Millman, MD      Visit Date: 12/21/2023   Universal Protocol:    Date/Time: 07/01/252:21 PM  Consent Given By: the patient  Position:  PRONE  Additional Comments: Vital signs were monitored before and after the procedure. Patient was prepped and draped in the usual sterile fashion. The correct patient, procedure, and site was  verified.   Injection Procedure Details:  Procedure Site One Meds Administered:  Meds ordered this encounter  Medications   methylPREDNISolone  acetate (DEPO-MEDROL ) injection 40 mg    Laterality: Bilateral  Location/Site:  S1 Foramen   Needle size: 22 ga.  Needle type: Spinal  Needle Placement: Transforaminal  Findings:   -Comments: Excellent flow of contrast along the nerve, nerve root and into the epidural space.  Epidurogram: Contrast epidurogram showed no nerve root cut off or restricted flow pattern.  Procedure Details: After squaring off the sacral end-plate to get a true AP view, the C-arm was positioned so that the best possible view of the S1 foramen was visualized. The soft tissues overlying this structure were infiltrated with 2-3 ml. of 1% Lidocaine  without Epinephrine .    The spinal needle was inserted toward the target using a trajectory view along the fluoroscope beam.  Under AP and lateral visualization, the needle was advanced so it did not puncture dura. Biplanar projections were used to confirm position. Aspiration was confirmed to be negative for CSF and/or blood. A 1-2 ml. volume of Isovue-250 was injected and flow of contrast was noted at each level. Radiographs were obtained for documentation purposes.   After attaining the desired flow of contrast documented above, a 0.5 to 1.0 ml test dose of 0.25% Marcaine was injected into each respective transforaminal space.  The patient was observed for 90 seconds post injection.  After no sensory deficits were reported, and normal lower extremity motor function was noted,   the above injectate was administered so that equal amounts of the injectate  were placed at each foramen (level) into the transforaminal epidural space.   Additional Comments:  The patient tolerated the procedure well Dressing: Band-Aid with 2 x 2 sterile gauze    Post-procedure details: Patient was observed during the  procedure. Post-procedure instructions were reviewed.  Patient left the clinic in stable condition.   Clinical History: EXAM: MRI LUMBAR SPINE WITHOUT CONTRAST  TECHNIQUE: Multiplanar, multisequence MR imaging of the lumbar spine was performed. No intravenous contrast was administered.  COMPARISON: Lumbar spine MRI 03/29/2019  FINDINGS: Segmentation: Standard.  Alignment: Mild lumbar dextroscoliosis. No listhesis.  Vertebrae: No fracture, suspicious marrow lesion, or significant marrow edema.  Conus medullaris and cauda equina: Conus extends to the L1 level. Conus and cauda equina appear normal.  Paraspinal and other soft tissues: Unremarkable.  Disc levels:  Mild disc desiccation and up to mild disc space narrowing throughout the lumbar spine.  L1-2: Mild disc bulging eccentric to the left, endplate spurring, and slight facet hypertrophy result in borderline to mild left neural foraminal stenosis without spinal stenosis, unchanged.  L2-3: Disc bulging eccentric to the right, endplate spurring, and mild facet hypertrophy result in mild right neural foraminal stenosis without spinal stenosis, unchanged.  L3-4: Mild disc bulging, endplate spurring, and mild facet hypertrophy result in borderline left neural foraminal stenosis without spinal stenosis, unchanged.  L4-5: Disc bulging, endplate spurring, and mild facet hypertrophy result in borderline to mild bilateral neural foraminal stenosis without spinal stenosis, unchanged.  L5-S1: The central disc extrusion on the prior study has regressed. There is a shallow residual central disc protrusion with annular fissure in close proximity to the left greater than right S1 nerve roots without evidence of neural compression or significant stenosis. A 6 mm focus of soft tissue in the left lateral recess inferior to the disc space presumably reflects a residual disc fragment, overall smaller than on the prior study though  potentially irritating the left S1 nerve root. Mild facet hypertrophy. Patent neural foramina.  IMPRESSION: 1. Regression of L5-S1 disc extrusion. Persistent small residual sequestered disc fragment in the left lateral recess potentially affecting the left S1 nerve root. 2. Unchanged disc and facet degeneration elsewhere with up to mild neural foraminal stenosis. No spinal stenosis.   Electronically Signed By: Dasie Hamburg M.D. On: 05/15/2021 19:20     Objective:  VS:  HT:    WT:   BMI:     BP:(!) 145/84  HR:87bpm  TEMP: ( )  RESP:  Physical Exam Vitals and nursing note reviewed.  Constitutional:      General: She is not in acute distress.    Appearance: Normal appearance. She is not ill-appearing.  HENT:     Head: Normocephalic and atraumatic.     Right Ear: External ear normal.     Left Ear: External ear normal.   Eyes:     Extraocular Movements: Extraocular movements intact.    Cardiovascular:     Rate and Rhythm: Normal rate.     Pulses: Normal pulses.  Pulmonary:     Effort: Pulmonary effort is normal. No respiratory distress.  Abdominal:     General: There is no distension.     Palpations: Abdomen is soft.   Musculoskeletal:        General: Tenderness present.     Cervical back: Neck supple.     Right lower leg: No edema.     Left lower leg: No edema.     Comments: Patient has good distal strength with no pain over  the greater trochanters.  No clonus or focal weakness.   Skin:    Findings: No erythema, lesion or rash.   Neurological:     General: No focal deficit present.     Mental Status: She is alert and oriented to person, place, and time.     Sensory: No sensory deficit.     Motor: No weakness or abnormal muscle tone.     Coordination: Coordination normal.   Psychiatric:        Mood and Affect: Mood normal.        Behavior: Behavior normal.      Imaging: XR C-ARM NO REPORT Result Date: 12/21/2023 Please see Notes tab for imaging  impression.

## 2023-12-24 LAB — TOXASSURE SELECT,+ANTIDEPR,UR
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
## 2024-01-02 ENCOUNTER — Other Ambulatory Visit: Payer: Self-pay | Admitting: Physician Assistant

## 2024-01-02 DIAGNOSIS — M3219 Other organ or system involvement in systemic lupus erythematosus: Secondary | ICD-10-CM

## 2024-01-02 NOTE — Telephone Encounter (Signed)
 Last Fill: 09/27/2023  Eye exam: 08/18/2023   Labs: 08/04/2023 Absolute lymphocytes remain borderline low but has improved. Rest of CBC WNL. Glucose is 131. Sodium remains borderline low but stable. Rest of cmp wnl.  ESR WNL  Urine protein creatinine ratio WNL   Complements WNL  dsDNA is negative  Beta-2  glycoprotein antibodies remain positive-stable.  Anticardiolipin IgA and IgM remain positive-stable.  No change in therapy recommended at this time.   Next Visit: 01/13/2024  Last Visit: 08/04/2023  IK:Nuyzm systemic lupus erythematosus with other organ involvement   Current Dose per office note 08/04/2023: Plaquenil  200 mg 1 tablet by mouth twice daily   Okay to refill Plaquenil ?

## 2024-01-11 DIAGNOSIS — F411 Generalized anxiety disorder: Secondary | ICD-10-CM | POA: Diagnosis not present

## 2024-01-13 ENCOUNTER — Encounter: Payer: Self-pay | Admitting: Rheumatology

## 2024-01-13 ENCOUNTER — Ambulatory Visit: Payer: HMO | Attending: Rheumatology | Admitting: Rheumatology

## 2024-01-13 VITALS — BP 129/75 | HR 73 | Resp 16 | Ht 61.0 in | Wt 182.6 lb

## 2024-01-13 DIAGNOSIS — F5101 Primary insomnia: Secondary | ICD-10-CM | POA: Diagnosis not present

## 2024-01-13 DIAGNOSIS — M3219 Other organ or system involvement in systemic lupus erythematosus: Secondary | ICD-10-CM | POA: Diagnosis not present

## 2024-01-13 DIAGNOSIS — K219 Gastro-esophageal reflux disease without esophagitis: Secondary | ICD-10-CM

## 2024-01-13 DIAGNOSIS — K760 Fatty (change of) liver, not elsewhere classified: Secondary | ICD-10-CM

## 2024-01-13 DIAGNOSIS — M19041 Primary osteoarthritis, right hand: Secondary | ICD-10-CM

## 2024-01-13 DIAGNOSIS — R760 Raised antibody titer: Secondary | ICD-10-CM

## 2024-01-13 DIAGNOSIS — I10 Essential (primary) hypertension: Secondary | ICD-10-CM

## 2024-01-13 DIAGNOSIS — R296 Repeated falls: Secondary | ICD-10-CM

## 2024-01-13 DIAGNOSIS — M47816 Spondylosis without myelopathy or radiculopathy, lumbar region: Secondary | ICD-10-CM

## 2024-01-13 DIAGNOSIS — M5030 Other cervical disc degeneration, unspecified cervical region: Secondary | ICD-10-CM | POA: Diagnosis not present

## 2024-01-13 DIAGNOSIS — M797 Fibromyalgia: Secondary | ICD-10-CM

## 2024-01-13 DIAGNOSIS — R5383 Other fatigue: Secondary | ICD-10-CM

## 2024-01-13 DIAGNOSIS — G252 Other specified forms of tremor: Secondary | ICD-10-CM

## 2024-01-13 DIAGNOSIS — Z79899 Other long term (current) drug therapy: Secondary | ICD-10-CM | POA: Diagnosis not present

## 2024-01-13 DIAGNOSIS — R748 Abnormal levels of other serum enzymes: Secondary | ICD-10-CM

## 2024-01-13 DIAGNOSIS — M8589 Other specified disorders of bone density and structure, multiple sites: Secondary | ICD-10-CM | POA: Diagnosis not present

## 2024-01-13 DIAGNOSIS — E559 Vitamin D deficiency, unspecified: Secondary | ICD-10-CM | POA: Diagnosis not present

## 2024-01-13 DIAGNOSIS — E66812 Obesity, class 2: Secondary | ICD-10-CM

## 2024-01-13 DIAGNOSIS — M19042 Primary osteoarthritis, left hand: Secondary | ICD-10-CM

## 2024-01-13 DIAGNOSIS — Z8269 Family history of other diseases of the musculoskeletal system and connective tissue: Secondary | ICD-10-CM

## 2024-01-13 NOTE — Patient Instructions (Signed)
 Vaccines You are taking a medication(s) that can suppress your immune system.  The following immunizations are recommended: Flu annually Covid-19  Td/Tdap (tetanus, diphtheria, pertussis) every 10 years Pneumonia (Prevnar 15 then Pneumovax 23 at least 1 year apart.  Alternatively, can take Prevnar 20 without needing additional dose) Shingrix: 2 doses from 4 weeks to 6 months apart  Please check with your PCP to make sure you are up to date.

## 2024-01-18 ENCOUNTER — Ambulatory Visit: Payer: Self-pay | Admitting: Rheumatology

## 2024-01-18 LAB — CBC WITH DIFFERENTIAL/PLATELET
Absolute Lymphocytes: 668 {cells}/uL — ABNORMAL LOW (ref 850–3900)
Absolute Monocytes: 348 {cells}/uL (ref 200–950)
Basophils Absolute: 28 {cells}/uL (ref 0–200)
Basophils Relative: 0.7 %
Eosinophils Absolute: 40 {cells}/uL (ref 15–500)
Eosinophils Relative: 1 %
HCT: 37.6 % (ref 35.0–45.0)
Hemoglobin: 12.5 g/dL (ref 11.7–15.5)
MCH: 32 pg (ref 27.0–33.0)
MCHC: 33.2 g/dL (ref 32.0–36.0)
MCV: 96.2 fL (ref 80.0–100.0)
MPV: 10.6 fL (ref 7.5–12.5)
Monocytes Relative: 8.7 %
Neutro Abs: 2916 {cells}/uL (ref 1500–7800)
Neutrophils Relative %: 72.9 %
Platelets: 249 Thousand/uL (ref 140–400)
RBC: 3.91 Million/uL (ref 3.80–5.10)
RDW: 12.9 % (ref 11.0–15.0)
Total Lymphocyte: 16.7 %
WBC: 4 Thousand/uL (ref 3.8–10.8)

## 2024-01-18 LAB — COMPREHENSIVE METABOLIC PANEL WITH GFR
AG Ratio: 1.7 (calc) (ref 1.0–2.5)
ALT: 23 U/L (ref 6–29)
AST: 26 U/L (ref 10–35)
Albumin: 4 g/dL (ref 3.6–5.1)
Alkaline phosphatase (APISO): 44 U/L (ref 37–153)
BUN: 8 mg/dL (ref 7–25)
CO2: 24 mmol/L (ref 20–32)
Calcium: 8.6 mg/dL (ref 8.6–10.4)
Chloride: 98 mmol/L (ref 98–110)
Creat: 0.96 mg/dL (ref 0.50–1.05)
Globulin: 2.4 g/dL (ref 1.9–3.7)
Glucose, Bld: 133 mg/dL — ABNORMAL HIGH (ref 65–99)
Potassium: 4.5 mmol/L (ref 3.5–5.3)
Sodium: 130 mmol/L — ABNORMAL LOW (ref 135–146)
Total Bilirubin: 0.6 mg/dL (ref 0.2–1.2)
Total Protein: 6.4 g/dL (ref 6.1–8.1)
eGFR: 66 mL/min/1.73m2 (ref >=60)

## 2024-01-18 LAB — PROTEIN / CREATININE RATIO, URINE
Creatinine, Urine: 80 mg/dL (ref 20–275)
Protein/Creat Ratio: 138 mg/g{creat} (ref 24–184)
Protein/Creatinine Ratio: 0.138 mg/mg{creat} (ref 0.024–0.184)
Total Protein, Urine: 11 mg/dL (ref 5–24)

## 2024-01-18 LAB — CARDIOLIPIN ANTIBODIES, IGG, IGM, IGA
Anticardiolipin IgA: 29.8 [APL'U]/mL — ABNORMAL HIGH (ref <20.0)
Anticardiolipin IgG: 9.1 [GPL'U]/mL (ref <20.0)
Anticardiolipin IgM: 30.1 [MPL'U]/mL — ABNORMAL HIGH (ref <20.0)

## 2024-01-18 LAB — BETA-2 GLYCOPROTEIN ANTIBODIES
Beta-2 Glyco 1 IgA: 29.2 U/mL — ABNORMAL HIGH (ref <20.0)
Beta-2 Glyco 1 IgM: 33.8 U/mL — ABNORMAL HIGH (ref <20.0)
Beta-2 Glyco I IgG: 26.1 U/mL — ABNORMAL HIGH (ref <20.0)

## 2024-01-18 LAB — ANTI-DNA ANTIBODY, DOUBLE-STRANDED: ds DNA Ab: 3 [IU]/mL

## 2024-01-18 LAB — SEDIMENTATION RATE: Sed Rate: 6 mm/h (ref 0–30)

## 2024-01-18 LAB — C3 AND C4
C3 Complement: 119 mg/dL (ref 83–193)
C4 Complement: 19 mg/dL (ref 15–57)

## 2024-01-18 NOTE — Progress Notes (Signed)
 Lymphocyte count is low and stable, glucose is mildly elevated and stable, sodium is low and stable, beta-2  GP 1 remains positive, anticardiolipin remains positive, urine protein creatinine ratio normal, double-stranded DNA negative, sed rate normal, complements normal.  No change in treatment advised.

## 2024-01-20 DIAGNOSIS — F331 Major depressive disorder, recurrent, moderate: Secondary | ICD-10-CM | POA: Diagnosis not present

## 2024-01-20 DIAGNOSIS — I10 Essential (primary) hypertension: Secondary | ICD-10-CM | POA: Diagnosis not present

## 2024-01-20 DIAGNOSIS — F333 Major depressive disorder, recurrent, severe with psychotic symptoms: Secondary | ICD-10-CM | POA: Diagnosis not present

## 2024-01-29 DIAGNOSIS — I10 Essential (primary) hypertension: Secondary | ICD-10-CM | POA: Diagnosis not present

## 2024-02-08 DIAGNOSIS — F333 Major depressive disorder, recurrent, severe with psychotic symptoms: Secondary | ICD-10-CM | POA: Diagnosis not present

## 2024-02-08 DIAGNOSIS — F410 Panic disorder [episodic paroxysmal anxiety] without agoraphobia: Secondary | ICD-10-CM | POA: Diagnosis not present

## 2024-02-08 DIAGNOSIS — F4312 Post-traumatic stress disorder, chronic: Secondary | ICD-10-CM | POA: Diagnosis not present

## 2024-02-09 DIAGNOSIS — E878 Other disorders of electrolyte and fluid balance, not elsewhere classified: Secondary | ICD-10-CM | POA: Diagnosis not present

## 2024-02-09 DIAGNOSIS — R5383 Other fatigue: Secondary | ICD-10-CM | POA: Diagnosis not present

## 2024-02-09 DIAGNOSIS — E871 Hypo-osmolality and hyponatremia: Secondary | ICD-10-CM | POA: Diagnosis not present

## 2024-02-09 DIAGNOSIS — R631 Polydipsia: Secondary | ICD-10-CM | POA: Diagnosis not present

## 2024-02-09 DIAGNOSIS — E559 Vitamin D deficiency, unspecified: Secondary | ICD-10-CM | POA: Diagnosis not present

## 2024-02-18 ENCOUNTER — Encounter: Attending: Registered Nurse | Admitting: Physical Medicine and Rehabilitation

## 2024-02-18 ENCOUNTER — Encounter: Payer: Self-pay | Admitting: Physical Medicine and Rehabilitation

## 2024-02-18 VITALS — BP 136/66 | HR 70 | Ht 61.0 in | Wt 182.6 lb

## 2024-02-18 DIAGNOSIS — G2401 Drug induced subacute dyskinesia: Secondary | ICD-10-CM | POA: Diagnosis not present

## 2024-02-18 DIAGNOSIS — M797 Fibromyalgia: Secondary | ICD-10-CM | POA: Diagnosis not present

## 2024-02-18 DIAGNOSIS — M7061 Trochanteric bursitis, right hip: Secondary | ICD-10-CM | POA: Diagnosis not present

## 2024-02-18 DIAGNOSIS — G894 Chronic pain syndrome: Secondary | ICD-10-CM | POA: Insufficient documentation

## 2024-02-18 MED ORDER — TRIAMCINOLONE ACETONIDE 40 MG/ML IJ SUSP
40.0000 mg | Freq: Once | INTRAMUSCULAR | Status: AC
Start: 2024-02-18 — End: 2024-02-18
  Administered 2024-02-18: 40 mg via INTRA_ARTICULAR

## 2024-02-18 MED ORDER — LIDOCAINE HCL 1 % IJ SOLN
1.0000 mL | Freq: Once | INTRAMUSCULAR | Status: AC
Start: 2024-02-18 — End: 2024-02-18
  Administered 2024-02-18: 1 mL

## 2024-02-18 NOTE — Patient Instructions (Signed)
 Plan: Needs to put pillow- thick pillow between your knees to sleep- or whenever- because it will take strain off the R knee/hip area.   2. steroid injection was performed at R trochanteric bursitis injection using 1% plain Lidocaine  and 40mg  /1cc of Kenalog . This was well tolerated.  Cleaned with betadine x3 and allowed to dry- then alcohol then injected using 27 gauge 1.5 inch needle- no bleeding or complications.    F/U in 3 months for steroid injections of R trochanteric bursa.  Lidocaine  will kick in 15 minutes- and wear off tonight- the steroid will kick in tomorrow within 24 hours and take up to 72 hours to fully kick in.  3. Con't Gabapentin - refills on Rx  4. Con't Lyrica - 100 mg BID- has refills.   5. Couldn't afford meds for tardive dyskinesia.    6. Con't Tramadol - has refills.   7. F/U in 2months with Fidela and 4 months with me for f/u and R trochanteric hip injection.

## 2024-02-18 NOTE — Progress Notes (Signed)
 Pt is a 63 yr old with severe depression- doesn't think has bipolar d/o per pt, allodynia, Lupus, fibromyalgia, and myofascial pain syndrome, and piriformis syndrome as well as bladder spasms and L4/5 disc issues with need for epidural - s/p epidural February 2022-Also has Lupus followed by Rheum.  Here for f/u on chronic pain -    Trochanteric bursitis injection helped 3-3.5 months-  Wants it again.   Now when lays on L side, R knee hurts real bad.  Whenever on L side-   Tylenol  #3- 1x/day- cost $45/month- cannot afford that every month.  Tried Good Rx.  We looked it up.   Hurting but trying to learn to live with it.  Low back ESI-  R side gives more trouble than L side.  Doesn't remember last ESI  Plan: Needs to put pillow- thick pillow between your knees to sleep- or whenever- because it will take strain off the R knee/hip area.   2. steroid injection was performed at R trochanteric bursitis injection using 1% plain Lidocaine  and 40mg  /1cc of Kenalog . This was well tolerated.  Cleaned with betadine x3 and allowed to dry- then alcohol then injected using 27 gauge 1.5 inch needle- no bleeding or complications.    F/U in 3 months for steroid injections of R trochanteric bursa.  Lidocaine  will kick in 15 minutes- and wear off tonight- the steroid will kick in tomorrow within 24 hours and take up to 72 hours to fully kick in.  3. Con't Gabapentin - refills on Rx  4. Con't Lyrica - 100 mg BID- has refills.   5. Couldn't afford meds for tardive dyskinesia.    6. Con't Tramadol - has refills.   7. F/U in 2months with Fidela and 4 months with me for f/u and R trochanteric hip injection.    I spent a total of 25   minutes on total care today- >50% coordination of care- due to 5 minute son injection- rest d/w pt about pain meds how taking and reviewing meds and labs

## 2024-02-20 DIAGNOSIS — F331 Major depressive disorder, recurrent, moderate: Secondary | ICD-10-CM | POA: Diagnosis not present

## 2024-02-20 DIAGNOSIS — I10 Essential (primary) hypertension: Secondary | ICD-10-CM | POA: Diagnosis not present

## 2024-02-20 DIAGNOSIS — F333 Major depressive disorder, recurrent, severe with psychotic symptoms: Secondary | ICD-10-CM | POA: Diagnosis not present

## 2024-02-22 DIAGNOSIS — F411 Generalized anxiety disorder: Secondary | ICD-10-CM | POA: Diagnosis not present

## 2024-02-29 DIAGNOSIS — I10 Essential (primary) hypertension: Secondary | ICD-10-CM | POA: Diagnosis not present

## 2024-03-21 DIAGNOSIS — F333 Major depressive disorder, recurrent, severe with psychotic symptoms: Secondary | ICD-10-CM | POA: Diagnosis not present

## 2024-03-21 DIAGNOSIS — F331 Major depressive disorder, recurrent, moderate: Secondary | ICD-10-CM | POA: Diagnosis not present

## 2024-03-21 DIAGNOSIS — I10 Essential (primary) hypertension: Secondary | ICD-10-CM | POA: Diagnosis not present

## 2024-03-25 ENCOUNTER — Other Ambulatory Visit: Payer: Self-pay | Admitting: Physician Assistant

## 2024-03-25 DIAGNOSIS — M3219 Other organ or system involvement in systemic lupus erythematosus: Secondary | ICD-10-CM

## 2024-03-27 NOTE — Telephone Encounter (Signed)
 Last Fill: 01/03/2024  Eye exam: 08/18/2023   Labs: 01/13/2024 Lymphocyte count is low and stable, glucose is mildly elevated and stable, sodium is low and stable, beta-2  GP 1 remains positive, anticardiolipin remains positive, urine protein creatinine ratio normal, double-stranded DNA negative, sed rate normal, complements normal. No change in treatment advised.   Next Visit: 07/13/2024  Last Visit: 01/13/2024  DX: Other systemic lupus erythematosus with other organ involvement   Current Dose per office note on 01/13/2024: Plaquenil  200 mg p.o. twice daily  Okay to refill Plaquenil ?

## 2024-03-29 ENCOUNTER — Telehealth: Payer: Self-pay | Admitting: Physical Medicine and Rehabilitation

## 2024-03-29 NOTE — Telephone Encounter (Signed)
 Patient called and needs a to make an appointment for back injection. CB#[phone]

## 2024-03-30 DIAGNOSIS — I10 Essential (primary) hypertension: Secondary | ICD-10-CM | POA: Diagnosis not present

## 2024-04-05 ENCOUNTER — Other Ambulatory Visit: Payer: Self-pay | Admitting: Physical Medicine and Rehabilitation

## 2024-04-05 ENCOUNTER — Telehealth: Payer: Self-pay

## 2024-04-05 DIAGNOSIS — M5416 Radiculopathy, lumbar region: Secondary | ICD-10-CM

## 2024-04-05 DIAGNOSIS — G8929 Other chronic pain: Secondary | ICD-10-CM

## 2024-04-05 DIAGNOSIS — F410 Panic disorder [episodic paroxysmal anxiety] without agoraphobia: Secondary | ICD-10-CM | POA: Diagnosis not present

## 2024-04-05 DIAGNOSIS — F4312 Post-traumatic stress disorder, chronic: Secondary | ICD-10-CM | POA: Diagnosis not present

## 2024-04-05 DIAGNOSIS — F333 Major depressive disorder, recurrent, severe with psychotic symptoms: Secondary | ICD-10-CM | POA: Diagnosis not present

## 2024-04-05 NOTE — Telephone Encounter (Signed)
 Last injection 7/25 %90 relief/function ability Duration 3 months Pain sore--8 No- falls or injuries Same pain and location as last time

## 2024-04-12 DIAGNOSIS — F411 Generalized anxiety disorder: Secondary | ICD-10-CM | POA: Diagnosis not present

## 2024-04-19 ENCOUNTER — Encounter: Attending: Registered Nurse | Admitting: Registered Nurse

## 2024-04-19 VITALS — BP 138/74 | HR 71 | Ht 61.0 in | Wt 177.6 lb

## 2024-04-19 DIAGNOSIS — M25561 Pain in right knee: Secondary | ICD-10-CM | POA: Insufficient documentation

## 2024-04-19 DIAGNOSIS — M545 Low back pain, unspecified: Secondary | ICD-10-CM | POA: Diagnosis not present

## 2024-04-19 DIAGNOSIS — G894 Chronic pain syndrome: Secondary | ICD-10-CM | POA: Insufficient documentation

## 2024-04-19 DIAGNOSIS — Z79891 Long term (current) use of opiate analgesic: Secondary | ICD-10-CM | POA: Diagnosis not present

## 2024-04-19 DIAGNOSIS — M7062 Trochanteric bursitis, left hip: Secondary | ICD-10-CM | POA: Insufficient documentation

## 2024-04-19 DIAGNOSIS — M542 Cervicalgia: Secondary | ICD-10-CM | POA: Diagnosis not present

## 2024-04-19 DIAGNOSIS — M7061 Trochanteric bursitis, right hip: Secondary | ICD-10-CM | POA: Insufficient documentation

## 2024-04-19 DIAGNOSIS — Z5181 Encounter for therapeutic drug level monitoring: Secondary | ICD-10-CM | POA: Insufficient documentation

## 2024-04-19 DIAGNOSIS — M797 Fibromyalgia: Secondary | ICD-10-CM | POA: Diagnosis not present

## 2024-04-19 DIAGNOSIS — M25562 Pain in left knee: Secondary | ICD-10-CM | POA: Diagnosis not present

## 2024-04-19 DIAGNOSIS — G8929 Other chronic pain: Secondary | ICD-10-CM | POA: Diagnosis not present
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
## 2024-04-21 DIAGNOSIS — F331 Major depressive disorder, recurrent, moderate: Secondary | ICD-10-CM | POA: Diagnosis not present

## 2024-04-21 DIAGNOSIS — F333 Major depressive disorder, recurrent, severe with psychotic symptoms: Secondary | ICD-10-CM | POA: Diagnosis not present

## 2024-04-21 DIAGNOSIS — I10 Essential (primary) hypertension: Secondary | ICD-10-CM | POA: Diagnosis not present

## 2024-04-24 ENCOUNTER — Encounter: Payer: Self-pay | Admitting: Radiology

## 2024-04-27 ENCOUNTER — Other Ambulatory Visit: Payer: Self-pay | Admitting: Rheumatology

## 2024-04-27 DIAGNOSIS — M8589 Other specified disorders of bone density and structure, multiple sites: Secondary | ICD-10-CM

## 2024-04-27 NOTE — Telephone Encounter (Signed)
 Last Fill: 10/25/2023  Labs: 01/13/2024 Lymphocyte count is low and stable, glucose is mildly elevated and stable, sodium is low and stable, beta-2  GP 1 remains positive, anticardiolipin remains positive, urine protein creatinine ratio normal, double-stranded DNA negative, sed rate normal, complements normal. No change in treatment advised.   Next Visit: 07/13/2024  Last Visit: 01/13/2024  DX: Osteopenia of multiple sites   Current Dose per office note on 01/13/2024: Fosamax  was started April 29, 2023.   Okay to refill Fosamax ?

## 2024-04-29 DIAGNOSIS — I10 Essential (primary) hypertension: Secondary | ICD-10-CM | POA: Diagnosis not present

## 2024-05-01 ENCOUNTER — Ambulatory Visit: Admitting: Physical Medicine and Rehabilitation

## 2024-05-01 ENCOUNTER — Other Ambulatory Visit: Payer: Self-pay

## 2024-05-01 VITALS — BP 157/81 | HR 70

## 2024-05-01 DIAGNOSIS — M5416 Radiculopathy, lumbar region: Secondary | ICD-10-CM

## 2024-05-01 MED ORDER — METHYLPREDNISOLONE ACETATE 40 MG/ML IJ SUSP
40.0000 mg | Freq: Once | INTRAMUSCULAR | Status: AC
  Administered 2024-05-01: 40 mg

## 2024-05-01 NOTE — Progress Notes (Signed)
 Ellsie Violette Hurley - 63 y.o. female MRN [id number]  Date of birth: [birth year]  Office Visit Note: Visit Date: 05/01/2024 PCP: Rolinda Millman, MD Referred by: Rolinda Millman, MD  Subjective: Chief Complaint  Patient presents with   Lower Back - Pain   HPI:  Terri Hurley is a 63 y.o. female who comes in today at the request of Duwaine Pouch, FNP for planned Bilateral S1-2 Lumbar Transforaminal epidural steroid injection with fluoroscopic guidance.  The patient has failed conservative care including home exercise, medications, time and activity modification.  This injection will be diagnostic and hopefully therapeutic.  Please see requesting physician notes for further details and justification.   ROS Otherwise per HPI.  Assessment & Plan: Visit Diagnoses:    ICD-10-CM   1. Lumbar radiculopathy  M54.16 XR C-ARM NO REPORT    Epidural Steroid injection    methylPREDNISolone  acetate (DEPO-MEDROL ) injection 40 mg      Plan: No additional findings.   Meds & Orders:  Meds ordered this encounter  Medications   methylPREDNISolone  acetate (DEPO-MEDROL ) injection 40 mg    Orders Placed This Encounter  Procedures   XR C-ARM NO REPORT   Epidural Steroid injection    Follow-up: No follow-ups on file.   Procedures: No procedures performed  S1 Lumbosacral Transforaminal Epidural Steroid Injection - Sub-Pedicular Approach with Fluoroscopic Guidance   Patient: Terri Hurley      Date of Birth: May 14, 1961 MRN: [id number] PCP: Rolinda Millman, MD      Visit Date: 05/01/2024   Universal Protocol:    Date/Time: 11/10/259:39 AM  Consent Given By: the patient  Position:  PRONE  Additional Comments: Vital signs were monitored before and after the procedure. Patient was prepped and draped in the usual sterile fashion. The correct patient, procedure, and site was verified.   Injection Procedure Details:  Procedure Site One Meds Administered:  Meds ordered this encounter   Medications   methylPREDNISolone  acetate (DEPO-MEDROL ) injection 40 mg    Laterality: Bilateral  Location/Site:  S1 Foramen   Needle size: 22 ga.  Needle type: Spinal  Needle Placement: Transforaminal  Findings:   -Comments: Excellent flow of contrast along the nerve, nerve root and into the epidural space.  Epidurogram: Contrast epidurogram showed no nerve root cut off or restricted flow pattern.  Procedure Details: After squaring off the sacral end-plate to get a true AP view, the C-arm was positioned so that the best possible view of the S1 foramen was visualized. The soft tissues overlying this structure were infiltrated with 2-3 ml. of 1% Lidocaine  without Epinephrine .    The spinal needle was inserted toward the target using a trajectory view along the fluoroscope beam.  Under AP and lateral visualization, the needle was advanced so it did not puncture dura. Biplanar projections were used to confirm position. Aspiration was confirmed to be negative for CSF and/or blood. A 1-2 ml. volume of Isovue-250 was injected and flow of contrast was noted at each level. Radiographs were obtained for documentation purposes.   After attaining the desired flow of contrast documented above, a 0.5 to 1.0 ml test dose of 0.25% Marcaine was injected into each respective transforaminal space.  The patient was observed for 90 seconds post injection.  After no sensory deficits were reported, and normal lower extremity motor function was noted,   the above injectate was administered so that equal amounts of the injectate were placed at each foramen (level) into the transforaminal epidural space.   Additional Comments:  The patient tolerated the procedure well Dressing: Band-Aid with 2 x 2 sterile gauze    Post-procedure details: Patient was observed during the procedure. Post-procedure instructions were reviewed.  Patient left the clinic in stable condition.   Clinical  History: EXAM: MRI LUMBAR SPINE WITHOUT CONTRAST  TECHNIQUE: Multiplanar, multisequence MR imaging of the lumbar spine was performed. No intravenous contrast was administered.  COMPARISON: Lumbar spine MRI 03/29/2019  FINDINGS: Segmentation: Standard.  Alignment: Mild lumbar dextroscoliosis. No listhesis.  Vertebrae: No fracture, suspicious marrow lesion, or significant marrow edema.  Conus medullaris and cauda equina: Conus extends to the L1 level. Conus and cauda equina appear normal.  Paraspinal and other soft tissues: Unremarkable.  Disc levels:  Mild disc desiccation and up to mild disc space narrowing throughout the lumbar spine.  L1-2: Mild disc bulging eccentric to the left, endplate spurring, and slight facet hypertrophy result in borderline to mild left neural foraminal stenosis without spinal stenosis, unchanged.  L2-3: Disc bulging eccentric to the right, endplate spurring, and mild facet hypertrophy result in mild right neural foraminal stenosis without spinal stenosis, unchanged.  L3-4: Mild disc bulging, endplate spurring, and mild facet hypertrophy result in borderline left neural foraminal stenosis without spinal stenosis, unchanged.  L4-5: Disc bulging, endplate spurring, and mild facet hypertrophy result in borderline to mild bilateral neural foraminal stenosis without spinal stenosis, unchanged.  L5-S1: The central disc extrusion on the prior study has regressed. There is a shallow residual central disc protrusion with annular fissure in close proximity to the left greater than right S1 nerve roots without evidence of neural compression or significant stenosis. A 6 mm focus of soft tissue in the left lateral recess inferior to the disc space presumably reflects a residual disc fragment, overall smaller than on the prior study though potentially irritating the left S1 nerve root. Mild facet hypertrophy. Patent neural foramina.  IMPRESSION: 1.  Regression of L5-S1 disc extrusion. Persistent small residual sequestered disc fragment in the left lateral recess potentially affecting the left S1 nerve root. 2. Unchanged disc and facet degeneration elsewhere with up to mild neural foraminal stenosis. No spinal stenosis.   Electronically Signed By: Dasie Hamburg M.D. On: 05/15/2021 19:20     Objective:  VS:  HT:    WT:   BMI:     BP:(!) 157/81  HR:70bpm  TEMP: ( )  RESP:  Physical Exam Vitals and nursing note reviewed.  Constitutional:      General: She is not in acute distress.    Appearance: Normal appearance. She is obese. She is not ill-appearing.  HENT:     Head: Normocephalic and atraumatic.     Right Ear: External ear normal.     Left Ear: External ear normal.  Eyes:     Extraocular Movements: Extraocular movements intact.  Cardiovascular:     Rate and Rhythm: Normal rate.     Pulses: Normal pulses.  Pulmonary:     Effort: Pulmonary effort is normal. No respiratory distress.  Abdominal:     General: There is no distension.     Palpations: Abdomen is soft.  Musculoskeletal:        General: Tenderness present.     Cervical back: Neck supple.     Right lower leg: No edema.     Left lower leg: No edema.     Comments: Patient has good distal strength with no pain over the greater trochanters.  No clonus or focal weakness.  Skin:    Findings: No erythema,  lesion or rash.  Neurological:     General: No focal deficit present.     Mental Status: She is alert and oriented to person, place, and time.     Sensory: No sensory deficit.     Motor: No weakness or abnormal muscle tone.     Coordination: Coordination normal.  Psychiatric:        Mood and Affect: Mood normal.        Behavior: Behavior normal.      Imaging: No results found.

## 2024-05-01 NOTE — Progress Notes (Signed)
 Pain Scale   Average Pain 7 Patient advising she has chronic lower back pain that is constant        +Driver, -BT, -Dye Allergies.

## 2024-05-01 NOTE — Procedures (Signed)
 S1 Lumbosacral Transforaminal Epidural Steroid Injection - Sub-Pedicular Approach with Fluoroscopic Guidance   Patient: Terri Hurley      Date of Birth: 1960-11-03 MRN: [id number] PCP: Rolinda Millman, MD      Visit Date: 05/01/2024   Universal Protocol:    Date/Time: 11/10/259:39 AM  Consent Given By: the patient  Position:  PRONE  Additional Comments: Vital signs were monitored before and after the procedure. Patient was prepped and draped in the usual sterile fashion. The correct patient, procedure, and site was verified.   Injection Procedure Details:  Procedure Site One Meds Administered:  Meds ordered this encounter  Medications   methylPREDNISolone  acetate (DEPO-MEDROL ) injection 40 mg    Laterality: Bilateral  Location/Site:  S1 Foramen   Needle size: 22 ga.  Needle type: Spinal  Needle Placement: Transforaminal  Findings:   -Comments: Excellent flow of contrast along the nerve, nerve root and into the epidural space.  Epidurogram: Contrast epidurogram showed no nerve root cut off or restricted flow pattern.  Procedure Details: After squaring off the sacral end-plate to get a true AP view, the C-arm was positioned so that the best possible view of the S1 foramen was visualized. The soft tissues overlying this structure were infiltrated with 2-3 ml. of 1% Lidocaine  without Epinephrine .    The spinal needle was inserted toward the target using a trajectory view along the fluoroscope beam.  Under AP and lateral visualization, the needle was advanced so it did not puncture dura. Biplanar projections were used to confirm position. Aspiration was confirmed to be negative for CSF and/or blood. A 1-2 ml. volume of Isovue-250 was injected and flow of contrast was noted at each level. Radiographs were obtained for documentation purposes.   After attaining the desired flow of contrast documented above, a 0.5 to 1.0 ml test dose of 0.25% Marcaine was injected into  each respective transforaminal space.  The patient was observed for 90 seconds post injection.  After no sensory deficits were reported, and normal lower extremity motor function was noted,   the above injectate was administered so that equal amounts of the injectate were placed at each foramen (level) into the transforaminal epidural space.   Additional Comments:  The patient tolerated the procedure well Dressing: Band-Aid with 2 x 2 sterile gauze    Post-procedure details: Patient was observed during the procedure. Post-procedure instructions were reviewed.  Patient left the clinic in stable condition.

## 2024-05-12 ENCOUNTER — Other Ambulatory Visit: Payer: Self-pay | Admitting: Physical Medicine and Rehabilitation

## 2024-05-15 DIAGNOSIS — F333 Major depressive disorder, recurrent, severe with psychotic symptoms: Secondary | ICD-10-CM | POA: Diagnosis not present

## 2024-05-15 DIAGNOSIS — F4312 Post-traumatic stress disorder, chronic: Secondary | ICD-10-CM | POA: Diagnosis not present

## 2024-05-15 DIAGNOSIS — F410 Panic disorder [episodic paroxysmal anxiety] without agoraphobia: Secondary | ICD-10-CM | POA: Diagnosis not present

## 2024-05-20 ENCOUNTER — Other Ambulatory Visit: Payer: Self-pay | Admitting: Physical Medicine and Rehabilitation

## 2024-05-21 DIAGNOSIS — F331 Major depressive disorder, recurrent, moderate: Secondary | ICD-10-CM | POA: Diagnosis not present

## 2024-05-21 DIAGNOSIS — F333 Major depressive disorder, recurrent, severe with psychotic symptoms: Secondary | ICD-10-CM | POA: Diagnosis not present

## 2024-05-21 DIAGNOSIS — I10 Essential (primary) hypertension: Secondary | ICD-10-CM | POA: Diagnosis not present

## 2024-05-22 ENCOUNTER — Encounter: Payer: Self-pay | Admitting: Physical Medicine and Rehabilitation

## 2024-05-22 ENCOUNTER — Encounter: Attending: Registered Nurse | Admitting: Physical Medicine and Rehabilitation

## 2024-05-22 VITALS — BP 126/79 | HR 72 | Ht 61.0 in | Wt 179.0 lb

## 2024-05-22 DIAGNOSIS — M7061 Trochanteric bursitis, right hip: Secondary | ICD-10-CM | POA: Diagnosis not present

## 2024-05-22 DIAGNOSIS — G894 Chronic pain syndrome: Secondary | ICD-10-CM | POA: Insufficient documentation

## 2024-05-22 DIAGNOSIS — Z5181 Encounter for therapeutic drug level monitoring: Secondary | ICD-10-CM | POA: Insufficient documentation

## 2024-05-22 DIAGNOSIS — M7062 Trochanteric bursitis, left hip: Secondary | ICD-10-CM | POA: Insufficient documentation

## 2024-05-22 DIAGNOSIS — Z79891 Long term (current) use of opiate analgesic: Secondary | ICD-10-CM | POA: Insufficient documentation

## 2024-05-22 MED ORDER — PREGABALIN 100 MG PO CAPS: 100.0000 mg | ORAL_CAPSULE | Freq: Two times a day (BID) | ORAL | 1 refills | Status: DC

## 2024-05-22 MED ORDER — LIDOCAINE HCL 1 % IJ SOLN: 2.0000 mL | Freq: Once | INTRAMUSCULAR | Status: AC

## 2024-05-22 MED ORDER — DICLOFENAC SODIUM 1 % EX GEL: 2.0000 g | Freq: Four times a day (QID) | CUTANEOUS | 1 refills | Status: AC

## 2024-05-22 MED ORDER — ACETAMINOPHEN-CODEINE 300-30 MG PO TABS: 1.0000 | ORAL_TABLET | Freq: Four times a day (QID) | ORAL | 5 refills | Status: AC | PRN

## 2024-05-22 MED ORDER — TRAMADOL HCL 50 MG PO TABS: 50.0000 mg | ORAL_TABLET | Freq: Four times a day (QID) | ORAL | 5 refills | Status: AC | PRN

## 2024-05-22 MED ORDER — TRIAMCINOLONE ACETONIDE 40 MG/ML IJ SUSP: 80.0000 mg | Freq: Once | INTRAMUSCULAR | Status: AC

## 2024-05-22 NOTE — Addendum Note (Signed)
 Addended by: Kalil Woessner M on: 05/22/2024 12:34 PM   Modules accepted: Orders

## 2024-05-22 NOTE — Progress Notes (Signed)
 Pt is a 63 yr old with severe depression- doesn't think has bipolar d/o per pt, allodynia, Lupus, fibromyalgia, and myofascial pain syndrome, and piriformis syndrome as well as bladder spasms and L4/5 disc issues with need for epidural - s/p epidural February 2022-Also has Lupus followed by Rheum.  Here for f/u on chronic pain - also has tardive dyskinesia.       Doing OK But getting cold- is hurting more.      Has had ESI- had on 4 weeks ago- Dr Eldonna did it.  Was helpful for back pain- doesn't take it completely away but eases it off.   Doing B/L trochanteric bursa injections today.   Injections did pretty well. Lasted almost 3 months- since last appt.    L shoulder > R shoulder for last couple of days.   Has Dr Barbarann, but hasn't seen ain awhile.   Not on anything for inflammation, but kidney function in past was running ~ 1.15 or so- back down to 0.96 and BUN 8- in 7/25.   In on Lyrica , Gabapentin , Tramadol  and tylenol  #3  Taking tramadol  1 pill/day- cannot afford more than that.  Tylenol  #3 1x/day- cannot afford more unfortunately   Thinks brother given up.    Plan:  For B/L trochanteric hip bursitis- steroid injection was performed at B/L hips laterally  using 1% plain Lidocaine  and 40mg  /1cc of Kenalog . This was well tolerated.  Cleaned with betadine x3 and allowed to dry- then alcohol then injected using 27 gauge 1.5 inch needle- no bleeding or complications.    F/U in 3 months for steroid injections of B/L trochanteric bursa injections.  Lidocaine  will kick in 15 minutes- and wear off tonight- the steroid will kick in tomorrow within 24 hours and take up to 72 hours to fully kick in.   2. Oral drug screen is due today.   3. Needs refill on Lyrica / pregabalin - 100 mg 2x/day- sent in refills.   4. Con't Tramadol  - needs refill-   refill expired   5. Cannot do NSAIDs- anti inflammatories due to intermittent increase in renal function for pain  6. Can use  diclofenac   gel-  up to 4x/day- on shoulders and knees- and hands- won't work on hips.  Sent in Rx for it  7. Sent in Refill of Tylenol  #3- has expired.   8. F/U in 3 months for trochanteric bursitis and f/u on chronic pain.    I spent a total of  25  minutes on total care today- >50% coordination of care- due to  5 minutes on injections- rest d/w pt about pain control- cannot add NSAID, but will do topical instead.

## 2024-05-22 NOTE — Progress Notes (Signed)
 Pt is a 63 yr old with severe depression- doesn't think has bipolar d/o per pt, allodynia, Lupus, fibromyalgia, and myofascial pain syndrome, and piriformis syndrome as well as bladder spasms and L4/5 disc issues with need for epidural - s/p epidural February 2022-Also has Lupus followed by Rheum.  Here for f/u on chronic pain -    Also has

## 2024-05-22 NOTE — Patient Instructions (Signed)
 Plan:  For B/L trochanteric hip bursitis- steroid injection was performed at B/L hips laterally  using 1% plain Lidocaine  and 40mg  /1cc of Kenalog . This was well tolerated.  Cleaned with betadine x3 and allowed to dry- then alcohol then injected using 27 gauge 1.5 inch needle- no bleeding or complications.    F/U in 3 months for steroid injections of B/L trochanteric bursa injections.  Lidocaine  will kick in 15 minutes- and wear off tonight- the steroid will kick in tomorrow within 24 hours and take up to 72 hours to fully kick in.   2. Oral drug screen is due today.   3. Needs refill on Lyrica / pregabalin - 100 mg 2x/day- sent in refills.   4. Con't Tramadol  - needs refill-   refill expired   5. Cannot do NSAIDs- anti inflammatories due to intermittent increase in renal function for pain  6. Can use diclofenac   gel-  up to 4x/day- on shoulders and knees- and hands- won't work on hips.  Sent in Rx for it  7. Sent in Refill of Tylenol  #3- has expired.   8. F/U in 3 months for trochanteric bursitis and f/u on chronic pain.

## 2024-05-25 LAB — DRUG TOX MONITOR 1 W/CONF, ORAL FLD
Amphetamines: NEGATIVE ng/mL (ref <10)
Barbiturates: NEGATIVE ng/mL (ref <10)
Benzodiazepines: NEGATIVE ng/mL (ref <0.50)
Buprenorphine: NEGATIVE ng/mL (ref <0.10)
Cocaine: NEGATIVE ng/mL (ref <5.0)
Codeine: 5.3 ng/mL — ABNORMAL HIGH (ref <2.5)
Dihydrocodeine: NEGATIVE ng/mL (ref <2.5)
Fentanyl: NEGATIVE ng/mL (ref <0.10)
Heroin Metabolite: NEGATIVE ng/mL (ref <1.0)
Hydrocodone: NEGATIVE ng/mL (ref <2.5)
Hydromorphone: NEGATIVE ng/mL (ref <2.5)
MARIJUANA: NEGATIVE ng/mL (ref <2.5)
MDMA: NEGATIVE ng/mL (ref <10)
Meprobamate: NEGATIVE ng/mL (ref <2.5)
Methadone: NEGATIVE ng/mL (ref <5.0)
Morphine: NEGATIVE ng/mL (ref <2.5)
Nicotine Metabolite: NEGATIVE ng/mL (ref <5.0)
Norhydrocodone: NEGATIVE ng/mL (ref <2.5)
Noroxycodone: NEGATIVE ng/mL (ref <2.5)
Opiates: POSITIVE ng/mL — AB (ref <2.5)
Oxycodone: NEGATIVE ng/mL (ref <2.5)
Oxymorphone: NEGATIVE ng/mL (ref <2.5)
Phencyclidine: NEGATIVE ng/mL (ref <10)
Tapentadol: NEGATIVE ng/mL (ref <5.0)
Tramadol: 474.6 ng/mL — ABNORMAL HIGH (ref <5.0)
Tramadol: POSITIVE ng/mL — AB (ref <5.0)
Zolpidem: NEGATIVE ng/mL (ref <5.0)

## 2024-05-25 LAB — DRUG TOX ALC METAB W/CON, ORAL FLD: Alcohol Metabolite: NEGATIVE ng/mL (ref <25)

## 2024-05-29 DIAGNOSIS — F411 Generalized anxiety disorder: Secondary | ICD-10-CM | POA: Diagnosis not present

## 2024-06-25 ENCOUNTER — Other Ambulatory Visit: Payer: Self-pay | Admitting: Physician Assistant

## 2024-06-25 DIAGNOSIS — M3219 Other organ or system involvement in systemic lupus erythematosus: Secondary | ICD-10-CM

## 2024-06-26 NOTE — Telephone Encounter (Signed)
 Last Fill: 03/27/2024   Eye exam: 08/18/2023   Labs: 01/13/2024 Lymphocyte count is low and stable, glucose is mildly elevated and stable, sodium is low and stable, beta-2  GP 1 remains positive, anticardiolipin remains positive, urine protein creatinine ratio normal, double-stranded DNA negative, sed rate normal, complements normal. No change in treatment advised.   Next Visit: 07/13/2024  Last Visit: 01/13/2024  DX: Other systemic lupus erythematosus with other organ involvement   Current Dose per office note on 01/13/2024: Plaquenil  200 mg p.o. twice daily  Okay to refill Plaquenil ?
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
## 2024-07-13 ENCOUNTER — Ambulatory Visit: Admitting: Rheumatology

## 2024-07-13 ENCOUNTER — Encounter: Payer: Self-pay | Admitting: Rheumatology

## 2024-07-13 VITALS — BP 120/73 | HR 70 | Temp 97.0°F | Resp 15 | Ht 61.0 in | Wt 178.0 lb

## 2024-07-13 DIAGNOSIS — M8589 Other specified disorders of bone density and structure, multiple sites: Secondary | ICD-10-CM

## 2024-07-13 DIAGNOSIS — M47816 Spondylosis without myelopathy or radiculopathy, lumbar region: Secondary | ICD-10-CM

## 2024-07-13 DIAGNOSIS — I10 Essential (primary) hypertension: Secondary | ICD-10-CM

## 2024-07-13 DIAGNOSIS — M5030 Other cervical disc degeneration, unspecified cervical region: Secondary | ICD-10-CM | POA: Diagnosis not present

## 2024-07-13 DIAGNOSIS — Z79899 Other long term (current) drug therapy: Secondary | ICD-10-CM | POA: Diagnosis not present

## 2024-07-13 DIAGNOSIS — G252 Other specified forms of tremor: Secondary | ICD-10-CM

## 2024-07-13 DIAGNOSIS — E559 Vitamin D deficiency, unspecified: Secondary | ICD-10-CM | POA: Diagnosis not present

## 2024-07-13 DIAGNOSIS — M19042 Primary osteoarthritis, left hand: Secondary | ICD-10-CM

## 2024-07-13 DIAGNOSIS — R760 Raised antibody titer: Secondary | ICD-10-CM | POA: Diagnosis not present

## 2024-07-13 DIAGNOSIS — K219 Gastro-esophageal reflux disease without esophagitis: Secondary | ICD-10-CM

## 2024-07-13 DIAGNOSIS — M19041 Primary osteoarthritis, right hand: Secondary | ICD-10-CM | POA: Diagnosis not present

## 2024-07-13 DIAGNOSIS — F5101 Primary insomnia: Secondary | ICD-10-CM | POA: Diagnosis not present

## 2024-07-13 DIAGNOSIS — M3219 Other organ or system involvement in systemic lupus erythematosus: Secondary | ICD-10-CM

## 2024-07-13 DIAGNOSIS — Z8269 Family history of other diseases of the musculoskeletal system and connective tissue: Secondary | ICD-10-CM

## 2024-07-13 DIAGNOSIS — K760 Fatty (change of) liver, not elsewhere classified: Secondary | ICD-10-CM

## 2024-07-13 DIAGNOSIS — E66812 Obesity, class 2: Secondary | ICD-10-CM

## 2024-07-13 DIAGNOSIS — R296 Repeated falls: Secondary | ICD-10-CM

## 2024-07-13 DIAGNOSIS — R5383 Other fatigue: Secondary | ICD-10-CM

## 2024-07-13 DIAGNOSIS — M797 Fibromyalgia: Secondary | ICD-10-CM

## 2024-07-13 NOTE — Patient Instructions (Signed)
 Vaccines You are taking a medication(s) that can suppress your immune system.  The following immunizations are recommended: Flu annually Covid-19  Td/Tdap (tetanus, diphtheria, pertussis) every 10 years Pneumonia (Prevnar 15 then Pneumovax 23 at least 1 year apart.  Alternatively, can take Prevnar 20 without needing additional dose) Shingrix: 2 doses from 4 weeks to 6 months apart  Please check with your PCP to make sure you are up to date.

## 2024-07-14 ENCOUNTER — Ambulatory Visit: Payer: Self-pay | Admitting: Rheumatology

## 2024-07-14 NOTE — Progress Notes (Signed)
 Urine protein creatinine ratio normal, white cell count is mildly decreased and lymphocyte count is low (due to Plaquenil  use), sodium and chloride are low, complements normal, sed rate normal, anti-DNA and anticardiolipin antibodies pending.  Labs do not indicate an autoimmune disease flare.  Please forward results to her PCP.

## 2024-07-15 LAB — C3 AND C4
C3 Complement: 102 mg/dL (ref 83–193)
C4 Complement: 17 mg/dL (ref 15–57)

## 2024-07-15 LAB — BETA-2 GLYCOPROTEIN ANTIBODIES
Beta-2 Glyco 1 IgA: 19.7 U/mL
Beta-2 Glyco 1 IgM: 23.4 U/mL — ABNORMAL HIGH
Beta-2 Glyco I IgG: 21.3 U/mL — ABNORMAL HIGH

## 2024-07-15 LAB — CBC WITH DIFFERENTIAL/PLATELET
Absolute Lymphocytes: 655 {cells}/uL — ABNORMAL LOW (ref 850–3900)
Absolute Monocytes: 455 {cells}/uL (ref 200–950)
Basophils Absolute: 59 {cells}/uL (ref 0–200)
Basophils Relative: 1.6 %
Eosinophils Absolute: 11 {cells}/uL — ABNORMAL LOW (ref 15–500)
Eosinophils Relative: 0.3 %
HCT: 37.4 % (ref 35.9–46.0)
Hemoglobin: 12.7 g/dL (ref 11.7–15.5)
MCH: 32.8 pg (ref 27.0–33.0)
MCHC: 34 g/dL (ref 31.6–35.4)
MCV: 96.6 fL (ref 81.4–101.7)
MPV: 10.6 fL (ref 7.5–12.5)
Monocytes Relative: 12.3 %
Neutro Abs: 2520 {cells}/uL (ref 1500–7800)
Neutrophils Relative %: 68.1 %
Platelets: 239 10*3/uL (ref 140–400)
RBC: 3.87 Million/uL (ref 3.80–5.10)
RDW: 12.7 % (ref 11.0–15.0)
Total Lymphocyte: 17.7 %
WBC: 3.7 10*3/uL — ABNORMAL LOW (ref 3.8–10.8)

## 2024-07-15 LAB — COMPREHENSIVE METABOLIC PANEL WITH GFR
AG Ratio: 2.3 (calc) (ref 1.0–2.5)
ALT: 22 U/L (ref 6–29)
AST: 22 U/L (ref 10–35)
Albumin: 4.2 g/dL (ref 3.6–5.1)
Alkaline phosphatase (APISO): 45 U/L (ref 37–153)
BUN/Creatinine Ratio: 5 (calc) — ABNORMAL LOW (ref 6–22)
BUN: 5 mg/dL — ABNORMAL LOW (ref 7–25)
CO2: 26 mmol/L (ref 20–32)
Calcium: 8.8 mg/dL (ref 8.6–10.4)
Chloride: 97 mmol/L — ABNORMAL LOW (ref 98–110)
Creat: 0.92 mg/dL (ref 0.50–1.05)
Globulin: 1.8 g/dL — ABNORMAL LOW (ref 1.9–3.7)
Glucose, Bld: 79 mg/dL (ref 65–99)
Potassium: 4.3 mmol/L (ref 3.5–5.3)
Sodium: 130 mmol/L — ABNORMAL LOW (ref 135–146)
Total Bilirubin: 0.6 mg/dL (ref 0.2–1.2)
Total Protein: 6 g/dL — ABNORMAL LOW (ref 6.1–8.1)
eGFR: 70 mL/min/{1.73_m2}

## 2024-07-15 LAB — PROTEIN / CREATININE RATIO, URINE
Creatinine, Urine: 207 mg/dL (ref 20–275)
Protein/Creat Ratio: 155 mg/g{creat} (ref 24–184)
Protein/Creatinine Ratio: 0.155 mg/mg{creat} (ref 0.024–0.184)
Total Protein, Urine: 32 mg/dL — ABNORMAL HIGH (ref 5–24)

## 2024-07-15 LAB — CARDIOLIPIN ANTIBODIES, IGG, IGM, IGA
Anticardiolipin IgA: 20.4 [APL'U]/mL — ABNORMAL HIGH
Anticardiolipin IgG: 6.4 [GPL'U]/mL
Anticardiolipin IgM: 23.8 [MPL'U]/mL — ABNORMAL HIGH

## 2024-07-15 LAB — SEDIMENTATION RATE: Sed Rate: 6 mm/h (ref 0–30)

## 2024-07-15 LAB — ANTI-DNA ANTIBODY, DOUBLE-STRANDED: ds DNA Ab: 3 [IU]/mL

## 2024-07-16 NOTE — Progress Notes (Signed)
 White cell count and lymphocyte count are low (most likely due to Plaquenil  use), sodium and chloride are low, beta-2  GP 1 and anticardiolipin remain positive and stable titers, double-stranded DNA negative, complements normal, sed rate normal.  Labs do not indicate an autoimmune disease flare.  Please forward results to her PCP.

## 2024-07-21 ENCOUNTER — Other Ambulatory Visit: Payer: Self-pay | Admitting: Rheumatology

## 2024-07-21 DIAGNOSIS — M8589 Other specified disorders of bone density and structure, multiple sites: Secondary | ICD-10-CM

## 2024-07-21 NOTE — Telephone Encounter (Signed)
 Last Fill: 04/27/2024  Labs: 07/13/2024 Urine protein creatinine ratio normal, white cell count is mildly decreased and lymphocyte count is low (due to Plaquenil  use), sodium and chloride are low, complements normal, sed rate normal, anti-DNA and anticardiolipin antibodies pending. Labs do not indicate an autoimmune disease flare.   Next Visit: 12/14/2024  Last Visit: 07/13/2024  DX: Osteopenia of multiple sites   Current Dose per office note on 07/13/2024: Fosamax  70 mg p.o. weekly   Okay to refill Fosamax ?

## 2024-08-23 ENCOUNTER — Encounter: Admitting: Physical Medicine and Rehabilitation

## 2024-12-14 ENCOUNTER — Ambulatory Visit: Admitting: Rheumatology
